# Patient Record
Sex: Male | Born: 1935
Health system: Southern US, Community
[De-identification: ages and names within clinical notes are randomized; demographics above are authoritative.]

## PROBLEM LIST (undated history)

## (undated) DIAGNOSIS — R0600 Dyspnea, unspecified: Secondary | ICD-10-CM

## (undated) DIAGNOSIS — D649 Anemia, unspecified: Secondary | ICD-10-CM

## (undated) DIAGNOSIS — E785 Hyperlipidemia, unspecified: Secondary | ICD-10-CM

## (undated) DIAGNOSIS — Q251 Coarctation of aorta: Secondary | ICD-10-CM

## (undated) DIAGNOSIS — E119 Type 2 diabetes mellitus without complications: Secondary | ICD-10-CM

## (undated) DIAGNOSIS — Z9289 Personal history of other medical treatment: Secondary | ICD-10-CM

## (undated) DIAGNOSIS — M1990 Unspecified osteoarthritis, unspecified site: Secondary | ICD-10-CM

## (undated) DIAGNOSIS — I350 Nonrheumatic aortic (valve) stenosis: Secondary | ICD-10-CM

## (undated) DIAGNOSIS — I10 Essential (primary) hypertension: Secondary | ICD-10-CM

## (undated) DIAGNOSIS — Q253 Supravalvular aortic stenosis: Secondary | ICD-10-CM

## (undated) DIAGNOSIS — K922 Gastrointestinal hemorrhage, unspecified: Secondary | ICD-10-CM

## (undated) HISTORY — DX: Hyperlipidemia, unspecified: E78.5

## (undated) HISTORY — PX: CARDIAC CATHETERIZATION: SHX172

## (undated) HISTORY — PX: HERNIA REPAIR: SHX51

---

## 2000-07-06 ENCOUNTER — Ambulatory Visit (HOSPITAL_COMMUNITY): Admission: RE | Admit: 2000-07-06 | Discharge: 2000-07-06 | Payer: Self-pay | Admitting: *Deleted

## 2000-07-06 ENCOUNTER — Encounter (INDEPENDENT_AMBULATORY_CARE_PROVIDER_SITE_OTHER): Payer: Self-pay | Admitting: *Deleted

## 2008-08-28 ENCOUNTER — Encounter: Admission: RE | Admit: 2008-08-28 | Discharge: 2008-11-26 | Payer: Self-pay | Admitting: Internal Medicine

## 2010-09-20 ENCOUNTER — Other Ambulatory Visit: Payer: Self-pay | Admitting: Surgery

## 2010-12-27 NOTE — Procedures (Signed)
[place]. [facility]  Patient:    Marvin Houston, Marvin Houston                           MRN: [id number] Proc. Date: 07/06/00 Attending:  Sabino Gasser, M.D.                           Procedure Report  PROCEDURE PERFORMED:  Upper endoscopy.  ENDOSCOPIST:  Sabino Gasser, M.D.  INDICATIONS FOR PROCEDURE:  Heme positivity.  ANESTHESIA:  Demerol 85 mg, Versed 8.5 mg were given intravenously in divided dose.  DESCRIPTION OF PROCEDURE:  With the patient mildly sedated in the left lateral decubitus position, the Olympus video endoscope was inserted in the mouth and passed under direct vision through the esophagus which appeared normal, into the stomach.  The fundus, body, antrum and duodenal bulb were all well-visualized and appeared normal.  From this point, the endoscope was slowly withdrawn taking circumferential views of the entire duodenal mucosa until the endoscope had been pulled back into the stomach and placed on retroflexion to view the stomach from below and this, too, appeared normal. The endoscope was then straightened and withdrawn taking cirfumferential views of the remaining of the remaining gastric mucosa and esophageal mucosa, all of which appeared normal.  Patients vital signs and pulse oximeter remained stable.  The patient tolerated the procedure well without apparent complications.  FINDINGS:  Essentially normal endoscopic examination.  PLAN:  Proceed to colonoscopy. DD:  07/06/00 TD:  07/06/00 Job: [id number]

## 2010-12-27 NOTE — Op Note (Signed)
[place]. [facility]  Patient:    Marvin Houston, Marvin Houston                           MRN: [id number] Proc. Date: 07/06/00 Adm. Date:  07/06/00 Attending:  Sabino Gasser, M.D.                           Operative Report  PROCEDURE:  Colonoscopy.  INDICATIONS:  Hemoccult positivity.  ANESTHESIA:  Demerol and additional 15 mg, Versed 1.5 additional mg.  PROCEDURE:  Colonoscopy.  INDICATIONS:  Hemoccult positivity.  ANESTHESIA:  Demerol 20 mg, Versed 3 mg was given additionally in a divided dose.  DESCRIPTION OF PROCEDURE:  With the patient mildly sedated in the left lateral decubitus position, the Olympus videoscopic colonoscope was inserted into the rectum and passed under direct vision with pressure applied with the patient position changed from the left lateral decubitus to his back to reach the cecum.  The cecum identified by ileocecal valve and appendiceal orifice. There was an AVM visualized in the cecum and this was photographed only.  From this point the endoscope was then slowly withdrawn taking circumferential views of the entire colonic mucosa stopping only then above the rectum, about 15 cm from the anal verge at which point a small polyp was seen, photographed and removed using snare cautery technique.  The endoscope was then pulled back to the rectum which appeared normal on direct, and in retroflexed views.  The endoscope was straightened and withdrawn.  The patients vital signs and pulse oximeter remained stable. The patient tolerated the procedure well without apparent complication.  FINDINGS:  Polyp of 15 cm from the anal verge, AVM which may have been the cause of this patients Hemoccult positivity.  Otherwise unremarkable examination.  PLAN:  Will have patient follow up with me as an outpatient and call me for results of biopsy. DD:  07/06/00 TD:  07/06/00 Job: [id number]

## 2014-08-28 DIAGNOSIS — R319 Hematuria, unspecified: Secondary | ICD-10-CM | POA: Diagnosis not present

## 2014-08-28 DIAGNOSIS — I10 Essential (primary) hypertension: Secondary | ICD-10-CM | POA: Diagnosis not present

## 2014-08-28 DIAGNOSIS — R739 Hyperglycemia, unspecified: Secondary | ICD-10-CM | POA: Diagnosis not present

## 2014-09-08 DIAGNOSIS — R319 Hematuria, unspecified: Secondary | ICD-10-CM | POA: Diagnosis not present

## 2014-09-08 DIAGNOSIS — I10 Essential (primary) hypertension: Secondary | ICD-10-CM | POA: Diagnosis not present

## 2014-09-22 DIAGNOSIS — D3192 Benign neoplasm of unspecified part of left eye: Secondary | ICD-10-CM | POA: Diagnosis not present

## 2014-09-28 DIAGNOSIS — I359 Nonrheumatic aortic valve disorder, unspecified: Secondary | ICD-10-CM | POA: Diagnosis not present

## 2014-10-20 DIAGNOSIS — E785 Hyperlipidemia, unspecified: Secondary | ICD-10-CM | POA: Diagnosis not present

## 2014-10-20 DIAGNOSIS — I10 Essential (primary) hypertension: Secondary | ICD-10-CM | POA: Diagnosis not present

## 2014-10-20 DIAGNOSIS — I2510 Atherosclerotic heart disease of native coronary artery without angina pectoris: Secondary | ICD-10-CM | POA: Diagnosis not present

## 2014-10-20 DIAGNOSIS — I350 Nonrheumatic aortic (valve) stenosis: Secondary | ICD-10-CM | POA: Diagnosis not present

## 2014-11-17 DIAGNOSIS — H18412 Arcus senilis, left eye: Secondary | ICD-10-CM | POA: Diagnosis not present

## 2014-11-17 DIAGNOSIS — H18411 Arcus senilis, right eye: Secondary | ICD-10-CM | POA: Diagnosis not present

## 2014-11-17 DIAGNOSIS — H02839 Dermatochalasis of unspecified eye, unspecified eyelid: Secondary | ICD-10-CM | POA: Diagnosis not present

## 2014-11-17 DIAGNOSIS — H2511 Age-related nuclear cataract, right eye: Secondary | ICD-10-CM | POA: Diagnosis not present

## 2014-11-17 DIAGNOSIS — H2512 Age-related nuclear cataract, left eye: Secondary | ICD-10-CM | POA: Diagnosis not present

## 2015-01-01 DIAGNOSIS — H25812 Combined forms of age-related cataract, left eye: Secondary | ICD-10-CM | POA: Diagnosis not present

## 2015-01-01 DIAGNOSIS — H2512 Age-related nuclear cataract, left eye: Secondary | ICD-10-CM | POA: Diagnosis not present

## 2015-01-02 DIAGNOSIS — H2511 Age-related nuclear cataract, right eye: Secondary | ICD-10-CM | POA: Diagnosis not present

## 2015-02-09 DIAGNOSIS — R319 Hematuria, unspecified: Secondary | ICD-10-CM | POA: Diagnosis not present

## 2015-02-09 DIAGNOSIS — I10 Essential (primary) hypertension: Secondary | ICD-10-CM | POA: Diagnosis not present

## 2015-02-16 DIAGNOSIS — Z0000 Encounter for general adult medical examination without abnormal findings: Secondary | ICD-10-CM | POA: Diagnosis not present

## 2015-02-16 DIAGNOSIS — E780 Pure hypercholesterolemia: Secondary | ICD-10-CM | POA: Diagnosis not present

## 2015-02-16 DIAGNOSIS — I10 Essential (primary) hypertension: Secondary | ICD-10-CM | POA: Diagnosis not present

## 2015-02-16 DIAGNOSIS — R739 Hyperglycemia, unspecified: Secondary | ICD-10-CM | POA: Diagnosis not present

## 2015-03-09 ENCOUNTER — Other Ambulatory Visit: Payer: Self-pay | Admitting: Physician Assistant

## 2015-03-09 DIAGNOSIS — D225 Melanocytic nevi of trunk: Secondary | ICD-10-CM | POA: Diagnosis not present

## 2015-03-09 DIAGNOSIS — L820 Inflamed seborrheic keratosis: Secondary | ICD-10-CM | POA: Diagnosis not present

## 2015-03-09 DIAGNOSIS — L821 Other seborrheic keratosis: Secondary | ICD-10-CM | POA: Diagnosis not present

## 2015-03-09 DIAGNOSIS — D485 Neoplasm of uncertain behavior of skin: Secondary | ICD-10-CM | POA: Diagnosis not present

## 2015-04-27 ENCOUNTER — Other Ambulatory Visit: Payer: Self-pay | Admitting: Physician Assistant

## 2015-04-27 DIAGNOSIS — L089 Local infection of the skin and subcutaneous tissue, unspecified: Secondary | ICD-10-CM | POA: Diagnosis not present

## 2015-04-27 DIAGNOSIS — D485 Neoplasm of uncertain behavior of skin: Secondary | ICD-10-CM | POA: Diagnosis not present

## 2015-04-27 DIAGNOSIS — L820 Inflamed seborrheic keratosis: Secondary | ICD-10-CM | POA: Diagnosis not present

## 2015-08-17 DIAGNOSIS — D225 Melanocytic nevi of trunk: Secondary | ICD-10-CM | POA: Diagnosis not present

## 2015-08-17 DIAGNOSIS — L905 Scar conditions and fibrosis of skin: Secondary | ICD-10-CM | POA: Diagnosis not present

## 2015-08-17 DIAGNOSIS — R7309 Other abnormal glucose: Secondary | ICD-10-CM | POA: Diagnosis not present

## 2015-08-17 DIAGNOSIS — I10 Essential (primary) hypertension: Secondary | ICD-10-CM | POA: Diagnosis not present

## 2015-08-17 DIAGNOSIS — L570 Actinic keratosis: Secondary | ICD-10-CM | POA: Diagnosis not present

## 2015-08-17 DIAGNOSIS — L821 Other seborrheic keratosis: Secondary | ICD-10-CM | POA: Diagnosis not present

## 2015-09-06 DIAGNOSIS — I10 Essential (primary) hypertension: Secondary | ICD-10-CM | POA: Diagnosis not present

## 2015-09-06 DIAGNOSIS — Z1389 Encounter for screening for other disorder: Secondary | ICD-10-CM | POA: Diagnosis not present

## 2015-09-06 DIAGNOSIS — E119 Type 2 diabetes mellitus without complications: Secondary | ICD-10-CM | POA: Diagnosis not present

## 2015-09-06 DIAGNOSIS — E785 Hyperlipidemia, unspecified: Secondary | ICD-10-CM | POA: Diagnosis not present

## 2015-10-11 DIAGNOSIS — R531 Weakness: Secondary | ICD-10-CM | POA: Diagnosis not present

## 2015-10-11 DIAGNOSIS — E119 Type 2 diabetes mellitus without complications: Secondary | ICD-10-CM | POA: Diagnosis not present

## 2015-10-11 DIAGNOSIS — R55 Syncope and collapse: Secondary | ICD-10-CM | POA: Diagnosis not present

## 2015-10-11 DIAGNOSIS — D509 Iron deficiency anemia, unspecified: Secondary | ICD-10-CM | POA: Diagnosis not present

## 2015-10-11 DIAGNOSIS — R42 Dizziness and giddiness: Secondary | ICD-10-CM | POA: Diagnosis not present

## 2015-10-11 DIAGNOSIS — K921 Melena: Secondary | ICD-10-CM | POA: Diagnosis not present

## 2015-10-11 DIAGNOSIS — R404 Transient alteration of awareness: Secondary | ICD-10-CM | POA: Diagnosis not present

## 2015-10-11 DIAGNOSIS — I2510 Atherosclerotic heart disease of native coronary artery without angina pectoris: Secondary | ICD-10-CM | POA: Diagnosis not present

## 2015-10-15 DIAGNOSIS — Z86010 Personal history of colonic polyps: Secondary | ICD-10-CM | POA: Diagnosis not present

## 2015-10-15 DIAGNOSIS — D649 Anemia, unspecified: Secondary | ICD-10-CM | POA: Diagnosis not present

## 2015-10-15 DIAGNOSIS — K625 Hemorrhage of anus and rectum: Secondary | ICD-10-CM | POA: Diagnosis not present

## 2015-10-15 DIAGNOSIS — Z8774 Personal history of (corrected) congenital malformations of heart and circulatory system: Secondary | ICD-10-CM | POA: Diagnosis not present

## 2015-10-19 DIAGNOSIS — E785 Hyperlipidemia, unspecified: Secondary | ICD-10-CM | POA: Diagnosis not present

## 2015-10-19 DIAGNOSIS — R0989 Other specified symptoms and signs involving the circulatory and respiratory systems: Secondary | ICD-10-CM | POA: Diagnosis not present

## 2015-10-19 DIAGNOSIS — I2510 Atherosclerotic heart disease of native coronary artery without angina pectoris: Secondary | ICD-10-CM | POA: Diagnosis not present

## 2015-10-19 DIAGNOSIS — I350 Nonrheumatic aortic (valve) stenosis: Secondary | ICD-10-CM | POA: Diagnosis not present

## 2015-11-15 DIAGNOSIS — K644 Residual hemorrhoidal skin tags: Secondary | ICD-10-CM | POA: Diagnosis not present

## 2015-11-15 DIAGNOSIS — K5730 Diverticulosis of large intestine without perforation or abscess without bleeding: Secondary | ICD-10-CM | POA: Diagnosis not present

## 2015-11-15 DIAGNOSIS — K648 Other hemorrhoids: Secondary | ICD-10-CM | POA: Diagnosis not present

## 2015-11-15 DIAGNOSIS — D500 Iron deficiency anemia secondary to blood loss (chronic): Secondary | ICD-10-CM | POA: Diagnosis not present

## 2015-11-15 DIAGNOSIS — K921 Melena: Secondary | ICD-10-CM | POA: Diagnosis not present

## 2015-11-28 DIAGNOSIS — I350 Nonrheumatic aortic (valve) stenosis: Secondary | ICD-10-CM | POA: Diagnosis not present

## 2015-11-28 DIAGNOSIS — R0989 Other specified symptoms and signs involving the circulatory and respiratory systems: Secondary | ICD-10-CM | POA: Diagnosis not present

## 2015-12-11 ENCOUNTER — Encounter (HOSPITAL_COMMUNITY): Payer: Self-pay | Admitting: Emergency Medicine

## 2015-12-11 DIAGNOSIS — Z79899 Other long term (current) drug therapy: Secondary | ICD-10-CM | POA: Diagnosis not present

## 2015-12-11 DIAGNOSIS — Z7984 Long term (current) use of oral hypoglycemic drugs: Secondary | ICD-10-CM | POA: Diagnosis not present

## 2015-12-11 DIAGNOSIS — Z7982 Long term (current) use of aspirin: Secondary | ICD-10-CM | POA: Diagnosis not present

## 2015-12-11 DIAGNOSIS — R339 Retention of urine, unspecified: Secondary | ICD-10-CM | POA: Diagnosis not present

## 2015-12-11 DIAGNOSIS — K5900 Constipation, unspecified: Secondary | ICD-10-CM | POA: Diagnosis not present

## 2015-12-11 LAB — CBC
HCT: 37.9 % — ABNORMAL LOW (ref 39.0–52.0)
HEMOGLOBIN: 12.4 g/dL — AB (ref 13.0–17.0)
MCH: 29.8 pg (ref 26.0–34.0)
MCHC: 32.7 g/dL (ref 30.0–36.0)
MCV: 91.1 fL (ref 78.0–100.0)
Platelets: 246 10*3/uL (ref 150–400)
RBC: 4.16 MIL/uL — ABNORMAL LOW (ref 4.22–5.81)
RDW: 14.2 % (ref 11.5–15.5)
WBC: 17.6 10*3/uL — ABNORMAL HIGH (ref 4.0–10.5)

## 2015-12-11 NOTE — ED Notes (Signed)
Pt. reports constipation for 3 days with abdominal pressure and bladder discomfort / urinary retention , denies nausea or fever .

## 2015-12-12 ENCOUNTER — Emergency Department (HOSPITAL_COMMUNITY)
Admission: EM | Admit: 2015-12-12 | Discharge: 2015-12-12 | Disposition: A | Discharge status: Home / Self Care | Payer: Medicare Other | Attending: Emergency Medicine | Admitting: Emergency Medicine

## 2015-12-12 DIAGNOSIS — R0989 Other specified symptoms and signs involving the circulatory and respiratory systems: Secondary | ICD-10-CM | POA: Diagnosis not present

## 2015-12-12 DIAGNOSIS — I2510 Atherosclerotic heart disease of native coronary artery without angina pectoris: Secondary | ICD-10-CM | POA: Diagnosis not present

## 2015-12-12 DIAGNOSIS — R339 Retention of urine, unspecified: Secondary | ICD-10-CM

## 2015-12-12 DIAGNOSIS — I10 Essential (primary) hypertension: Secondary | ICD-10-CM | POA: Diagnosis not present

## 2015-12-12 DIAGNOSIS — I350 Nonrheumatic aortic (valve) stenosis: Secondary | ICD-10-CM | POA: Diagnosis not present

## 2015-12-12 LAB — COMPREHENSIVE METABOLIC PANEL
ALBUMIN: 4.5 g/dL (ref 3.5–5.0)
ALK PHOS: 67 U/L (ref 38–126)
ALT: 21 U/L (ref 17–63)
AST: 21 U/L (ref 15–41)
Anion gap: 13 (ref 5–15)
BILIRUBIN TOTAL: 2 mg/dL — AB (ref 0.3–1.2)
BUN: 13 mg/dL (ref 6–20)
CO2: 20 mmol/L — ABNORMAL LOW (ref 22–32)
Calcium: 9.6 mg/dL (ref 8.9–10.3)
Chloride: 104 mmol/L (ref 101–111)
Creatinine, Ser: 1.14 mg/dL (ref 0.61–1.24)
GFR calc Af Amer: >60 mL/min (ref >60)
GFR calc non Af Amer: 59 mL/min — ABNORMAL LOW (ref >60)
GLUCOSE: 215 mg/dL — AB (ref 65–99)
POTASSIUM: 4 mmol/L (ref 3.5–5.1)
Sodium: 137 mmol/L (ref 135–145)
TOTAL PROTEIN: 7.5 g/dL (ref 6.5–8.1)

## 2015-12-12 LAB — URINE MICROSCOPIC-ADD ON

## 2015-12-12 LAB — URINALYSIS, ROUTINE W REFLEX MICROSCOPIC
Bilirubin Urine: NEGATIVE
GLUCOSE, UA: 250 mg/dL — AB
Ketones, ur: 15 mg/dL — AB
Leukocytes, UA: NEGATIVE
Nitrite: NEGATIVE
PH: 5.5 (ref 5.0–8.0)
PROTEIN: 30 mg/dL — AB
Specific Gravity, Urine: 1.013 (ref 1.005–1.030)

## 2015-12-12 LAB — LIPASE, BLOOD: Lipase: 33 U/L (ref 11–51)

## 2015-12-12 NOTE — Discharge Instructions (Signed)
You were seen today for urinary retention. This is treated with a Foley catheter. He will go home with a Foley catheter. You need follow-up with urology. Make sure to take stool softeners while taking iron to continue to have bowel movements. If you have any new or worsening symptoms she should be reevaluated immediately. If you develop fever, back pain, vomiting, or any new or worsening symptoms she needs to be reevaluated.  Acute Urinary Retention, Male Acute urinary retention is the temporary inability to urinate. This is a common problem in older men. As men age their prostates become larger and block the flow of urine from the bladder. This is usually a problem that has come on gradually.  HOME CARE INSTRUCTIONS If you are sent home with a Foley catheter and a drainage system, you will need to discuss the best course of action with your health care provider. While the catheter is in, maintain a good intake of fluids. Keep the drainage bag emptied and lower than your catheter. This is so that contaminated urine will not flow back into your bladder, which could lead to a urinary tract infection. There are two main types of drainage bags. One is a large bag that usually is used at night. It has a good capacity that will allow you to sleep through the night without having to empty it. The second type is called a leg bag. It has a smaller capacity, so it needs to be emptied more frequently. However, the main advantage is that it can be attached by a leg strap and can go underneath your clothing, allowing you the freedom to move about or leave your home. Only take over-the-counter or prescription medicines for pain, discomfort, or fever as directed by your health care provider.  SEEK MEDICAL CARE IF:  You develop a low-grade fever.  You experience spasms or leakage of urine with the spasms. SEEK IMMEDIATE MEDICAL CARE IF:   You develop chills or fever.  Your catheter stops draining urine.  Your  catheter falls out.  You start to develop increased bleeding that does not respond to rest and increased fluid intake. MAKE SURE YOU:  Understand these instructions.  Will watch your condition.  Will get help right away if you are not doing well or get worse.   This information is not intended to replace advice given to you by your health care provider. Make sure you discuss any questions you have with your health care provider.   Document Released: 11/03/2000 Document Revised: 12/12/2014 Document Reviewed: 01/06/2013 Elsevier Interactive Patient Education [employer].

## 2015-12-12 NOTE — ED Provider Notes (Signed)
CSN: [id number]     Arrival date & time 12/11/15  2257 History   First MD Initiated Contact with Patient 12/12/15 [phone]     Chief Complaint  Patient presents with  . Constipation     (Consider location/radiation/quality/duration/timing/severity/associated sxs/prior Treatment) HPI  This is a 82 year old male who presents with constipation and urinary retention. Patient reports 3 day history of constipation. States that he was started on iron tablets for anemia in March and since that time he has had issues with constipation. He had not had a normal bowel movement in 2-3 days. He took Dulcolax yesterday and since waiting in the emergency room has had multiple soft stools. However, he states that he has not had any significant urine output since yesterday morning at 8:30. He denies any history of urinary retention. He denies fevers or back pain. He currently is reporting lower abdominal pressure. No nausea or vomiting.  History reviewed. No pertinent past medical history. History reviewed. No pertinent past surgical history. No family history on file. Social History  Substance Use Topics  . Smoking status: None  . Smokeless tobacco: None  . Alcohol Use: None    Review of Systems  Constitutional: Negative for fever.  Respiratory: Negative for shortness of breath.   Cardiovascular: Negative for chest pain.  Gastrointestinal: Positive for abdominal pain and constipation. Negative for vomiting and diarrhea.  Genitourinary: Positive for difficulty urinating. Negative for dysuria and flank pain.  All other systems reviewed and are negative.     Allergies  Review of patient's allergies indicates no known allergies.  Home Medications   Prior to Admission medications   Medication Sig Start Date End Date Taking? Authorizing Provider  aspirin EC 81 MG tablet Take 81 mg by mouth daily.   Yes Historical Provider, MD  Cyanocobalamin (VITAMIN B-12 PO) Take 1 tablet by mouth daily.   Yes  Historical Provider, MD  FeFum-FePoly-FA-B Cmp-C-Biot (FOLIVANE-PLUS PO) Take 1 tablet by mouth daily.    Yes Historical Provider, MD  lisinopril (PRINIVIL,ZESTRIL) 40 MG tablet Take 40 mg by mouth daily.   Yes Historical Provider, MD  lovastatin (MEVACOR) 40 MG tablet Take 40 mg by mouth at bedtime.   Yes Historical Provider, MD  metFORMIN (GLUCOPHAGE) 500 MG tablet Take 500 mg by mouth every evening.   Yes Historical Provider, MD  Multiple Vitamin (MULTIVITAMIN WITH MINERALS) TABS tablet Take 1 tablet by mouth daily.   Yes Historical Provider, MD  Multiple Vitamins-Minerals (VISION VITAMINS PO) Take 1 tablet by mouth daily.   Yes Historical Provider, MD  sildenafil (VIAGRA) 100 MG tablet Take 100 mg by mouth daily as needed for erectile dysfunction.   Yes Historical Provider, MD   BP 140/85 mmHg  Pulse 85  Temp(Src) 98.2 F (36.8 C) (Oral)  Resp 18  Ht 5\' 10"  (1.778 m)  Wt 216 lb 4 oz (98.09 kg)  BMI 31.03 kg/m2  SpO2 98% Physical Exam  Constitutional: He is oriented to person, place, and time. He appears well-developed and well-nourished.  Elderly, no acute distress  HENT:  Head: Normocephalic and atraumatic.  Cardiovascular: Normal rate, regular rhythm and normal heart sounds.   No murmur heard. Pulmonary/Chest: Effort normal and breath sounds normal. No respiratory distress. He has no wheezes.  Abdominal: Soft. Bowel sounds are normal. There is tenderness. There is no rebound and no guarding.  Lower abdominal tenderness to palpation without rebound or guarding  Genitourinary:  No CVA tenderness  Musculoskeletal: He exhibits no edema.  Neurological: He is  alert and oriented to person, place, and time.  Skin: Skin is warm and dry.  Psychiatric: He has a normal mood and affect.  Nursing note and vitals reviewed.   ED Course  Procedures (including critical care time) Labs Review Labs Reviewed  COMPREHENSIVE METABOLIC PANEL - Abnormal; Notable for the following:    CO2 20  (*)    Glucose, Bld 215 (*)    Total Bilirubin 2.0 (*)    GFR calc non Af Amer 59 (*)    All other components within normal limits  CBC - Abnormal; Notable for the following:    WBC 17.6 (*)    RBC 4.16 (*)    Hemoglobin 12.4 (*)    HCT 37.9 (*)    All other components within normal limits  URINALYSIS, ROUTINE W REFLEX MICROSCOPIC (NOT AT [facility]) - Abnormal; Notable for the following:    Glucose, UA 250 (*)    Hgb urine dipstick MODERATE (*)    Ketones, ur 15 (*)    Protein, ur 30 (*)    All other components within normal limits  URINE MICROSCOPIC-ADD ON - Abnormal; Notable for the following:    Squamous Epithelial / LPF 6-30 (*)    Bacteria, UA RARE (*)    All other components within normal limits  LIPASE, BLOOD    Imaging Review No results found. I have personally reviewed and evaluated these images and lab results as part of my medical decision-making.   EKG Interpretation None      MDM   Final diagnoses:  Urinary retention    Patient presents with recent history of constipation and urinary retention 1 day. He has some lower abdominal tenderness. Since being in the ER he has had multiple bowel movements after taking a laxative. His tenderness is in the suprapubic region. Bladder scan shows greater than 1 L of fluid in the bladder. Foley was placed with total urine output of greater than 1900 mL. No evidence of your urinary tract infection. Patient reports improvement of abdominal pain with Foley placement. Kidney function is at baseline. He does have evidence of leukocytosis. This is of unclear significance. No CVA tenderness or fever. Repeat abdominal exam is benign. Discussed with patient follow-up with urology. Continue Foley catheter until follow-up. He was given strict return precautions. He was also encouraged to use a stool softener while taking iron. Hemoglobin today is 12.4.  After history, exam, and medical workup I feel the patient has been appropriately  medically screened and is safe for discharge home. Pertinent diagnoses were discussed with the patient. Patient was given return precautions.     Merryl Hacker, MD 12/12/15 [phone]

## 2015-12-17 ENCOUNTER — Encounter (HOSPITAL_COMMUNITY): Payer: Self-pay | Admitting: Emergency Medicine

## 2015-12-17 ENCOUNTER — Emergency Department (HOSPITAL_COMMUNITY)
Admission: EM | Admit: 2015-12-17 | Discharge: 2015-12-17 | Disposition: A | Discharge status: Home / Self Care | Payer: Medicare Other | Attending: Emergency Medicine | Admitting: Emergency Medicine

## 2015-12-17 DIAGNOSIS — R319 Hematuria, unspecified: Secondary | ICD-10-CM | POA: Insufficient documentation

## 2015-12-17 DIAGNOSIS — R339 Retention of urine, unspecified: Secondary | ICD-10-CM | POA: Diagnosis not present

## 2015-12-17 DIAGNOSIS — Z7984 Long term (current) use of oral hypoglycemic drugs: Secondary | ICD-10-CM | POA: Diagnosis not present

## 2015-12-17 DIAGNOSIS — I10 Essential (primary) hypertension: Secondary | ICD-10-CM | POA: Insufficient documentation

## 2015-12-17 DIAGNOSIS — R1030 Lower abdominal pain, unspecified: Secondary | ICD-10-CM | POA: Insufficient documentation

## 2015-12-17 DIAGNOSIS — R39198 Other difficulties with micturition: Secondary | ICD-10-CM | POA: Insufficient documentation

## 2015-12-17 DIAGNOSIS — N401 Enlarged prostate with lower urinary tract symptoms: Secondary | ICD-10-CM | POA: Diagnosis not present

## 2015-12-17 DIAGNOSIS — Z79899 Other long term (current) drug therapy: Secondary | ICD-10-CM | POA: Diagnosis not present

## 2015-12-17 DIAGNOSIS — Z7982 Long term (current) use of aspirin: Secondary | ICD-10-CM | POA: Insufficient documentation

## 2015-12-17 DIAGNOSIS — R351 Nocturia: Secondary | ICD-10-CM | POA: Diagnosis not present

## 2015-12-17 HISTORY — DX: Essential (primary) hypertension: I10

## 2015-12-17 LAB — URINALYSIS, ROUTINE W REFLEX MICROSCOPIC
BILIRUBIN URINE: NEGATIVE
Glucose, UA: NEGATIVE mg/dL
KETONES UR: NEGATIVE mg/dL
Leukocytes, UA: NEGATIVE
NITRITE: NEGATIVE
PROTEIN: 30 mg/dL — AB
Specific Gravity, Urine: 1.006 (ref 1.005–1.030)
pH: 6 (ref 5.0–8.0)

## 2015-12-17 LAB — URINE MICROSCOPIC-ADD ON: SQUAMOUS EPITHELIAL / LPF: NONE SEEN

## 2015-12-17 NOTE — Discharge Instructions (Signed)
Acute Urinary Retention, Male °Acute urinary retention is the temporary inability to urinate. °This is a common problem in older men. As men age their prostates become larger and block the flow of urine from the bladder. This is usually a problem that has come on gradually.  °HOME CARE INSTRUCTIONS °If you are sent home with a Foley catheter and a drainage system, you will need to discuss the best course of action with your health care provider. While the catheter is in, maintain a good intake of fluids. Keep the drainage bag emptied and lower than your catheter. This is so that contaminated urine will not flow back into your bladder, which could lead to a urinary tract infection. °There are two main types of drainage bags. One is a large bag that usually is used at night. It has a good capacity that will allow you to sleep through the night without having to empty it. The second type is called a leg bag. It has a smaller capacity, so it needs to be emptied more frequently. However, the main advantage is that it can be attached by a leg strap and can go underneath your clothing, allowing you the freedom to move about or leave your home. °Only take over-the-counter or prescription medicines for pain, discomfort, or fever as directed by your health care provider.  °SEEK MEDICAL CARE IF: °· You develop a low-grade fever. °· You experience spasms or leakage of urine with the spasms. °SEEK IMMEDIATE MEDICAL CARE IF:  °· You develop chills or fever. °· Your catheter stops draining urine. °· Your catheter falls out. °· You start to develop increased bleeding that does not respond to rest and increased fluid intake. °MAKE SURE YOU: °· Understand these instructions. °· Will watch your condition. °· Will get help right away if you are not doing well or get worse. °  °This information is not intended to replace advice given to you by your health care provider. Make sure you discuss any questions you have with your health care  provider. °  °Document Released: 11/03/2000 Document Revised: 12/12/2014 Document Reviewed: 01/06/2013 °Elsevier Interactive Patient Education ©2016 Elsevier Inc. ° °

## 2015-12-17 NOTE — ED Provider Notes (Signed)
CSN: [id number]     Arrival date & time 12/17/15  1842 History   First MD Initiated Contact with Patient 12/17/15 1933     Chief Complaint  Patient presents with  . Urinary Retention     (Consider location/radiation/quality/duration/timing/severity/associated sxs/prior Treatment) HPI Comments: Patient with history of urinary retention. Seen at urology office today and foley catheter was removed. As the day progressed, patient has been unable to void, and suprapubic pressure and pain have developed.  Bladder scan reveals 999 ml of fluid.  Patient is a 82 y.o. male presenting with abdominal pain.  Abdominal Pain Pain location:  Suprapubic Pain quality: fullness   Pain radiates to:  Does not radiate Pain severity:  Moderate Onset quality:  Sudden Duration:  6 hours Timing:  Constant Progression:  Worsening Chronicity:  Recurrent   Past Medical History  Diagnosis Date  . Hypertension    History reviewed. No pertinent past surgical history. History reviewed. No pertinent family history. Social History  Substance Use Topics  . Smoking status: Never Smoker   . Smokeless tobacco: None  . Alcohol Use: Yes    Review of Systems  Gastrointestinal: Positive for abdominal pain.  Genitourinary: Positive for difficulty urinating.  All other systems reviewed and are negative.     Allergies  Review of patient's allergies indicates no known allergies.  Home Medications   Prior to Admission medications   Medication Sig Start Date End Date Taking? Authorizing Provider  aspirin EC 81 MG tablet Take 81 mg by mouth daily.    Historical Provider, MD  Cyanocobalamin (VITAMIN B-12 PO) Take 1 tablet by mouth daily.    Historical Provider, MD  FeFum-FePoly-FA-B Cmp-C-Biot (FOLIVANE-PLUS PO) Take 1 tablet by mouth daily.     Historical Provider, MD  lisinopril (PRINIVIL,ZESTRIL) 40 MG tablet Take 40 mg by mouth daily.    Historical Provider, MD  lovastatin (MEVACOR) 40 MG tablet Take 40 mg  by mouth at bedtime.    Historical Provider, MD  metFORMIN (GLUCOPHAGE) 500 MG tablet Take 500 mg by mouth every evening.    Historical Provider, MD  Multiple Vitamin (MULTIVITAMIN WITH MINERALS) TABS tablet Take 1 tablet by mouth daily.    Historical Provider, MD  Multiple Vitamins-Minerals (VISION VITAMINS PO) Take 1 tablet by mouth daily.    Historical Provider, MD  sildenafil (VIAGRA) 100 MG tablet Take 100 mg by mouth daily as needed for erectile dysfunction.    Historical Provider, MD   BP 204/99 mmHg  Pulse 89  Temp(Src) 98.5 F (36.9 C) (Oral)  Resp 18  SpO2 98% Physical Exam  Constitutional: He is oriented to person, place, and time. He appears well-developed and well-nourished.  HENT:  Head: Normocephalic.  Eyes: Conjunctivae are normal.  Neck: Neck supple.  Cardiovascular: Normal rate and regular rhythm.   Pulmonary/Chest: Effort normal and breath sounds normal.  Abdominal: Soft. There is tenderness.    Musculoskeletal: He exhibits no edema or tenderness.  Lymphadenopathy:    He has no cervical adenopathy.  Neurological: He is alert and oriented to person, place, and time.  Skin: Skin is warm and dry.  Psychiatric: He has a normal mood and affect.  Nursing note and vitals reviewed.   ED Course  Procedures (including critical care time) Labs Review Labs Reviewed  URINALYSIS, ROUTINE W REFLEX MICROSCOPIC (NOT AT [facility]) - Abnormal; Notable for the following:    Hgb urine dipstick LARGE (*)    Protein, ur 30 (*)    All other components within  normal limits  URINE MICROSCOPIC-ADD ON - Abnormal; Notable for the following:    Bacteria, UA RARE (*)    All other components within normal limits    Imaging Review No results found. I have personally reviewed and evaluated these images and lab results as part of my medical decision-making.   EKG Interpretation None     Patient discussed with and seen by Dr. Gilford Raid. Patient feels better and blood pressure improved  after insertion of foley and decompression of bladder. MDM   Final diagnoses:  None  Urinary retention. Hematuria, but no current indication of infection.  Care instructions provided. Return precautions discussed. Patient is scheduled for urology follow-up on Friday.     Etta Quill, NP 12/17/15 2220

## 2015-12-17 NOTE — ED Notes (Signed)
Pt sts had foley removed today at urology office; pt sts unable to void

## 2015-12-17 NOTE — ED Notes (Signed)
Scanned Pt bladder Pt bladder has 999 mL of fluid

## 2015-12-24 DIAGNOSIS — N401 Enlarged prostate with lower urinary tract symptoms: Secondary | ICD-10-CM | POA: Diagnosis not present

## 2016-01-16 DIAGNOSIS — E119 Type 2 diabetes mellitus without complications: Secondary | ICD-10-CM | POA: Diagnosis not present

## 2016-01-16 DIAGNOSIS — N390 Urinary tract infection, site not specified: Secondary | ICD-10-CM | POA: Diagnosis not present

## 2016-01-16 DIAGNOSIS — E785 Hyperlipidemia, unspecified: Secondary | ICD-10-CM | POA: Diagnosis not present

## 2016-01-16 DIAGNOSIS — I10 Essential (primary) hypertension: Secondary | ICD-10-CM | POA: Diagnosis not present

## 2016-01-18 ENCOUNTER — Other Ambulatory Visit: Payer: Self-pay | Admitting: Internal Medicine

## 2016-01-18 ENCOUNTER — Ambulatory Visit (HOSPITAL_COMMUNITY)
Admission: RE | Admit: 2016-01-18 | Discharge: 2016-01-18 | Disposition: A | Discharge status: Home / Self Care | Payer: Medicare Other | Source: Ambulatory Visit | Attending: Internal Medicine | Admitting: Internal Medicine

## 2016-01-18 ENCOUNTER — Other Ambulatory Visit (HOSPITAL_COMMUNITY): Payer: Self-pay | Admitting: *Deleted

## 2016-01-18 DIAGNOSIS — D500 Iron deficiency anemia secondary to blood loss (chronic): Secondary | ICD-10-CM

## 2016-01-18 DIAGNOSIS — D649 Anemia, unspecified: Secondary | ICD-10-CM | POA: Diagnosis not present

## 2016-01-18 LAB — ABO/RH: ABO/RH(D): O NEG

## 2016-01-18 LAB — PREPARE RBC (CROSSMATCH)

## 2016-01-18 MED ORDER — DIPHENHYDRAMINE HCL 25 MG PO CAPS
ORAL_CAPSULE | ORAL | Status: AC
  Filled 2016-01-18: qty 1

## 2016-01-18 MED ORDER — ACETAMINOPHEN 325 MG PO TABS
650.0000 mg | ORAL_TABLET | Freq: Once | ORAL | Status: AC
  Administered 2016-01-18: 650 mg via ORAL

## 2016-01-18 MED ORDER — SODIUM CHLORIDE 0.9 % IV SOLN
Freq: Once | INTRAVENOUS | Status: AC
Start: 2016-01-18 — End: 2016-01-18
  Administered 2016-01-18: 10:00:00 via INTRAVENOUS

## 2016-01-18 MED ORDER — ACETAMINOPHEN 325 MG PO TABS
ORAL_TABLET | ORAL | Status: AC
  Filled 2016-01-18: qty 2

## 2016-01-18 MED ORDER — DIPHENHYDRAMINE HCL 25 MG PO CAPS
25.0000 mg | ORAL_CAPSULE | Freq: Once | ORAL | Status: AC
  Administered 2016-01-18: 25 mg via ORAL

## 2016-01-19 LAB — TYPE AND SCREEN
ABO/RH(D): O NEG
Antibody Screen: NEGATIVE
UNIT DIVISION: 0
UNIT DIVISION: 0

## 2016-01-30 DIAGNOSIS — D509 Iron deficiency anemia, unspecified: Secondary | ICD-10-CM | POA: Diagnosis not present

## 2016-02-22 DIAGNOSIS — L905 Scar conditions and fibrosis of skin: Secondary | ICD-10-CM | POA: Diagnosis not present

## 2016-02-22 DIAGNOSIS — L820 Inflamed seborrheic keratosis: Secondary | ICD-10-CM | POA: Diagnosis not present

## 2016-02-22 DIAGNOSIS — L814 Other melanin hyperpigmentation: Secondary | ICD-10-CM | POA: Diagnosis not present

## 2016-02-22 DIAGNOSIS — D235 Other benign neoplasm of skin of trunk: Secondary | ICD-10-CM | POA: Diagnosis not present

## 2016-02-22 DIAGNOSIS — D1801 Hemangioma of skin and subcutaneous tissue: Secondary | ICD-10-CM | POA: Diagnosis not present

## 2016-02-27 DIAGNOSIS — H5203 Hypermetropia, bilateral: Secondary | ICD-10-CM | POA: Diagnosis not present

## 2016-02-27 DIAGNOSIS — D3132 Benign neoplasm of left choroid: Secondary | ICD-10-CM | POA: Diagnosis not present

## 2016-02-29 DIAGNOSIS — D509 Iron deficiency anemia, unspecified: Secondary | ICD-10-CM | POA: Diagnosis not present

## 2016-03-07 DIAGNOSIS — D509 Iron deficiency anemia, unspecified: Secondary | ICD-10-CM | POA: Diagnosis not present

## 2016-03-07 DIAGNOSIS — R739 Hyperglycemia, unspecified: Secondary | ICD-10-CM | POA: Diagnosis not present

## 2016-03-07 DIAGNOSIS — I10 Essential (primary) hypertension: Secondary | ICD-10-CM | POA: Diagnosis not present

## 2016-04-18 DIAGNOSIS — H26492 Other secondary cataract, left eye: Secondary | ICD-10-CM | POA: Diagnosis not present

## 2016-04-18 DIAGNOSIS — H2511 Age-related nuclear cataract, right eye: Secondary | ICD-10-CM | POA: Diagnosis not present

## 2016-04-18 DIAGNOSIS — Z961 Presence of intraocular lens: Secondary | ICD-10-CM | POA: Diagnosis not present

## 2016-04-18 DIAGNOSIS — I10 Essential (primary) hypertension: Secondary | ICD-10-CM | POA: Diagnosis not present

## 2016-04-25 DIAGNOSIS — H2513 Age-related nuclear cataract, bilateral: Secondary | ICD-10-CM | POA: Diagnosis not present

## 2016-05-02 DIAGNOSIS — R739 Hyperglycemia, unspecified: Secondary | ICD-10-CM | POA: Diagnosis not present

## 2016-05-02 DIAGNOSIS — I10 Essential (primary) hypertension: Secondary | ICD-10-CM | POA: Diagnosis not present

## 2016-05-09 DIAGNOSIS — I10 Essential (primary) hypertension: Secondary | ICD-10-CM | POA: Diagnosis not present

## 2016-05-09 DIAGNOSIS — R739 Hyperglycemia, unspecified: Secondary | ICD-10-CM | POA: Diagnosis not present

## 2016-05-09 DIAGNOSIS — Z125 Encounter for screening for malignant neoplasm of prostate: Secondary | ICD-10-CM | POA: Diagnosis not present

## 2016-05-09 DIAGNOSIS — Z23 Encounter for immunization: Secondary | ICD-10-CM | POA: Diagnosis not present

## 2016-05-09 DIAGNOSIS — E7800 Pure hypercholesterolemia, unspecified: Secondary | ICD-10-CM | POA: Diagnosis not present

## 2016-06-11 DIAGNOSIS — I350 Nonrheumatic aortic (valve) stenosis: Secondary | ICD-10-CM | POA: Diagnosis not present

## 2016-06-20 DIAGNOSIS — R0989 Other specified symptoms and signs involving the circulatory and respiratory systems: Secondary | ICD-10-CM | POA: Diagnosis not present

## 2016-06-20 DIAGNOSIS — I2510 Atherosclerotic heart disease of native coronary artery without angina pectoris: Secondary | ICD-10-CM | POA: Diagnosis not present

## 2016-06-20 DIAGNOSIS — I350 Nonrheumatic aortic (valve) stenosis: Secondary | ICD-10-CM | POA: Diagnosis not present

## 2016-06-20 DIAGNOSIS — I10 Essential (primary) hypertension: Secondary | ICD-10-CM | POA: Diagnosis not present

## 2016-07-28 NOTE — H&P (Signed)
OFFICE VISIT NOTES COPIED TO EPIC FOR DOCUMENTATION  . History of Present Illness Marvin Page MD; 06/21/2016 12:28 PM) Patient words: 6 month FU for aortic stenosis and echo results; Last OV 12/12/2015.  The patient is a 82 year old male who presents for a follow-up for Aortic stenosis. Marvin Houston is a very active and pleasant Caucasian male with moderate aortic stenosis and mild CAD who recently presented for his annual visit. He denies syncope, exertional syncope, dyspnea at rest, orthopnea or chest pain. Overall, doing well and is on appropriate medical therapy.  In January 2017, he had normal physical exam and labs were within normal limits. He had developed severe anemia and upper GI evaluation in April 2017 by Dr. Jamey Ripa, felt to be due to hemorrhoids both internal and external along with large AV malformations however they were not bleeding in the ascending colon and cecum. He was recommended capsule endoscopy if anemia persisted as patient has history of anemia in the past  No recurrence of GI bleed. Presently feels more fatigued than usual and states that his golf game is not as good as it used to be in the past. Occasional left leg cramps. No TIA or claudication. No further constipatiion since being on daily Metamucil.   Problem List/Past Medical Anderson Malta Sergeant; 2016/07/15 11:51 AM) Essential hypertension, benign (I10)  Aortic stenosis, severe (I35.0)  Echocardiogram 06/11/2016: Left ventricle cavity is normal in size. Moderate concentric hypertrophy of the left ventricle. Normal global wall motion. Doppler evidence of grade II (pseudonormal) diastolic dysfunction, elevated LAP. Calculated EF 58%. Left atrial cavity is mildly dilated at 4.2 cm. Mild calcification of the aortic valve annulus. Moderate aortic valve leaflet calcification. Severely restricted aortic valve leaflets. Severe aortic valve stenosis. Aortic valve peak pressure gradient of 68 and mean gradient of 41  mmHg, calculated aortic valve area 0.80 cm. No aortic valve regurgitation noted. Mild (Grade I) mitral regurgitation. Mild tricuspid regurgitation. No evidence of pulmonary hypertension. IVC is dilated with respiratory variation. Suggests elevated central venous pressure. Compared to the study done on 11/28/2015, 812 gradients has progressed from peak gradient of 49 and mean gradient of 31 mmHg. Hyperlipidemia, mild (E78.5)  10/11/2015: HB 9.4/HCT 28.4 with normocytic indices Labs 12/30/2013: Total cholesterol 105, triglycerides 76, HDL 28, LDL 62. CBC, CMP was normal. HbA1c 5.5%. Lipid profile 12/03/2012: Total cholesterol 122, triglycerides 99, HDL 37, LDL 65. Bilateral carotid bruits (R09.89)  Carotid artery duplex 11/28/2015: Minimal plaque of the carotid arteries bilateral without stenosis. Antegrade right vertebral artery flow. Antegrade left vertebral artery flow. No significant change since study dated 09/19/2013. Screening for other and unspecified cardiovascular conditions (V81.2)  Colonic Polyps follows Dr. Paulita Fujita.  Colonoscopy 11/15/2015: Internal hemorrhoids, scattered diverticula, a few large angiodysplastic lesions without bleeding in the ascending colon and cecum. Anemia likely secondary to chronic disease, blood in stool likely from hemorrhoids. CAD in native artery (I25.10) 2007 Heart Catheterization in 2007. No significant CAD. Mild AS, 60% RCA stenosis, Controlled type 2 diabetes mellitus without complication, without long-term current use of insulin (E11.9)   Allergies Anderson Malta Sergeant; 07-15-2016 11:46 AM) No Known Drug Allergies 07/19/2012  Family History Anderson Malta Sergeant; 2016-07-15 11:46 AM) Mother  Deceased. at age 90, from natural causes Father  Deceased. at age 74, from blood disease Siblings  brother, CABG at age 15 2 brothers, deceased from Cancer 3 sisters, deceased from Natural Causes  Social History Lolita Lenz; 2016-07-15 11:46 AM) Current tobacco  use  Former smoker. quit in 1983 Alcohol  Use  Moderate alcohol use. drinks 2 glasses of red wine per night Marital status  Married. Number of Children  2. Living Situation  Lives with spouse.  Past Surgical History Lolita Lenz; 06/20/2016 11:46 AM) Heart Catheterization in 2007. No significant CAD.   Medication History Anderson Malta Sergeant; 06/20/2016 11:53 AM) Lisinopril (40MG  Tablet, 1 Oral daily) Active. Aspirin (81MG  Tablet, 1 Oral daily) Active. One-A-Day Mens Health Formula (1 Oral daily) Active. Eye Health Formula (180-15-5MG  Capsule, 1 Oral daily) Active. Lovastatin (40MG  Tablet, 1 Oral twice weekly) Active. Vitamin B-12 2.5mg  (1 daily) Active. MetFORMIN HCl (500MG  Tablet, 1 Oral daily) Active. Folivane-Plus (1 Oral daily) Active. Medications Reconciled (verbally with pt/ no list or medications present) Iron (18MG  Tablet ER, 1 Oral two times daily) Active.  Diagnostic Studies History Anderson Malta Sergeant; 06/20/2016 11:46 AM) Echocardiogram 06/11/2016 Left ventricle cavity is normal in size. Moderate concentric hypertrophy of the left ventricle. Normal global wall motion. Doppler evidence of grade II (pseudonormal) diastolic dysfunction, elevated LAP. Calculated EF 58%. Left atrial cavity is mildly dilated at 4.2 cm. Mild calcification of the aortic valve annulus. Moderate aortic valve leaflet calcification. Severely restricted aortic valve leaflets. Severe aortic valve stenosis. Aortic valve peak pressure gradient of 68 and mean gradient of 41 mmHg, calculated aortic valve area 0.80 cm. No aortic valve regurgitation noted. Mild (Grade I) mitral regurgitation. Mild tricuspid regurgitation. No evidence of pulmonary hypertension. IVC is dilated with respiratory variation. Suggests elevated central venous pressure. Compared to the study done on 11/28/2015, 812 gradients has progressed from peak gradient of 49 and mean gradient of 31 mmHg. echo 01/23/09: Normal LV syst  function. Mild AS. LBBB wall motion.  carotid doppler 05/30/10: Mild fibrocalcific plaque right ICA, othersise no significant disease  Labwork  10/11/2015: HB 9.4/HCT 28.4 with normocytic indices 12/30/2013: Total cholesterol 105, triglycerides 76, HDL 28, LDL 62. CBC, CMP was normal. HbA1c 5.5%. 12/03/2012: Total cholesterol 122, triglycerides 99, HDL 37, LDL 65. Coronary Angiogram 2007 Heart Catheterization in 2007. No significant CAD. Mild AS, 60% RCA stenosis, Carotid Doppler 09/19/2013 No evidence of hemodynamically significant stenosis in the bilateral carotid bifurcation vessels. No significant change since 05/30/2010. Mild soft plaque bilateral carotids noted Echocardiogram 11/28/2015 1. Left ventricle cavity is normal in size. Moderate concentric hypertrophy of the left ventricle. Normal global wall motion. Doppler evidence of grade I (impaired) diastolic dysfunction. Calculated EF 61%. 2. No aortic valve regurgitation noted. Moderate calcification of the aortic valve annulus. Moderate aortic valve leaflet calcification. Moderate to severely restricted aortic valve leaflets. Moderate to severe aortic valve stenosis. Peak gradient- 49, mean gradient 31.4 mm of Hg. Calculated valve area is 0.77cm2. 3. Mild to moderate mitral regurgitation. Mild calcification of the mitral valve annulus. 4. Trace tricuspid regurgitation. PA pressure can't be calculated due to insufficient TR signal. 5. Mildly dilated ascending aorta, measures 3.7 cm. 6. IVC is dilated with respiratory variation. 7. c.f. echo. of 09/28/2014, severity of aortic stenosis has increased Carotid Doppler 11/28/2015 Minimal plaque of the carotid arteries bilateral without stenosis. Antegrade right vertebral artery flow. Antegrade left vertebral artery flow. No significant change since study dated 09/19/2013.  Other Problems Anderson Malta Sergeant; 06/20/2016 11:51 AM) Carotid artery stenosis without cerebral infarction (I65.29)  Carotid  doppler 05/30/10: Mild fibrocalcific plaque right ICA, othersise no significant disease. No stenosis.    Review of Systems Marvin Page MD; 06/21/2016 12:28 PM) General Not Present- Fatigue (New onset since GI bleed in February 2017), Tiredness and Unable to Sleep Lying Flat. HEENT Not Present-  Blurred Vision. Respiratory Present- Difficulty Breathing on Exertion. Not Present- Bloody sputum and Wakes up from Sleep Wheezing or Short of Breath. Cardiovascular Present- Leg Cramps (Occasinal leg cramps at night). Not Present- Edema, Palpitations and Paroxysmal Nocturnal Dyspnea. Gastrointestinal Not Present- Bloody Stool and Heartburn. Male Genitourinary Not Present- Urinary Retention. Musculoskeletal Present- Back Pain (chronic and sees a chiropractor occasionally). Not Present- Claudication and Joint Pain. Neurological Not Present- Focal Neurological Symptoms. Psychiatric Not Present- Personality Changes and Suicidal Ideation. Hematology Not Present- Blood Clots, Easy Bruising and Nose Bleed.  Vitals Marvin Page MD; 06/20/2016 12:29 PM) 06/20/2016 12:29 PM Weight: 206.31 lb Height: 70in Body Surface Area: 2.12 m Body Mass Index: 29.6 kg/m  BP: 150/80 (Sitting, Left Arm, Standard)    06/20/2016 11:50 AM Weight: 206.31 lb Height: 70in Body Surface Area: 2.12 m Body Mass Index: 29.6 kg/m  Pulse: 54 (Regular)  P.OX: 98% (Room air) BP: 172/74 (Sitting, Left Arm, Standard)       Physical Exam Marvin Page, MD; 06/21/2016 12:32 PM) General Mental Status-Alert. General Appearance-Cooperative, Appears stated age, Not in acute distress. Build & Nutrition-Muscular and Well built.  Head and Neck Thyroid Gland Characteristics - no palpable nodules, no palpable enlargement.  Chest and Lung Exam Chest and lung exam reveals -on auscultation, normal breath sounds, no adventitious sounds.  Cardiovascular Inspection Jugular vein - Right -  No Distention. Auscultation Heart Sounds - S1 WNL, S2 WNL and No gallop present. Murmurs & Other Heart Sounds: Murmur - Location - Apex. Timing - Early systolic. Grade - III/VI. Character - Crescendo. Radiation - Carotids.  Abdomen Palpation/Percussion Normal exam - Non Tender and No hepatosplenomegaly. Auscultation Normal exam - Bowel sounds normal.  Peripheral Vascular Lower Extremity Inspection - Bilateral - Inspection Normal. Palpation - Edema - Bilateral - No edema. Femoral pulse - Bilateral - Normal. Popliteal pulse - Left - 1+. Right - Normal. Dorsalis pedis pulse - Left - 1+. Right - Normal. Bilateral - Normal. Posterior tibial pulse - Left - 1+. Right - Normal. Bilateral - Normal. Carotid arteries - Left-Harsh Bruit. Carotid arteries - Right-Soft Bruit. Abdomen-No prominent abdominal aortic pulsation, No epigastric bruit.  Neurologic Neurologic evaluation reveals -alert and oriented x 3 with no impairment of recent or remote memory. Motor-Grossly intact without any focal deficits.  Musculoskeletal Global Assessment Left Lower Extremity - normal range of motion without pain. Right Lower Extremity - normal range of motion without pain.   Results Marvin Page MD; 06/21/2016 12:32 PM) Procedures  Name Value Date Echocardiography, transthoracic, real-time with image documentation (2D), includes M-mode recording, when performed, complete, with spectral Doppler echocardiography, and with color flow Doppler echocardiography [id number]) : CAD Comments: Echocardiogram 06/11/2016: Left ventricle cavity is normal in size. Moderate concentric hypertrophy of the left ventricle. Normal global wall motion. Doppler evidence of grade II (pseudonormal) diastolic dysfunction, elevated LAP. Calculated EF 58%. Left atrial cavity is mildly dilated at 4.2 cm. Mild calcification of the aortic valve annulus. Moderate aortic valve leaflet calcification. Severely restricted aortic valve  leaflets. Severe aortic valve stenosis. Aortic valve peak pressure gradient of 68 and mean gradient of 41 mmHg, calculated aortic valve area 0.80 cm. No aortic valve regurgitation noted. Mild (Grade I) mitral regurgitation. Mild tricuspid regurgitation. No evidence of pulmonary hypertension. IVC is dilated with respiratory variation. Suggests elevated central venous pressure. Compared to the study done on 11/28/2015, 812 gradients has progressed from peak gradient of 49 and mean gradient of 31 mmHg.  Performed: 06/13/2016 6:52 AM  Assessment & Plan Marvin Page MD; 06/21/2016 12:31 PM) CAD in native artery (I25.10) Story: Heart Catheterization in 2007. No significant CAD. Mild AS, 60% RCA stenosis, Impression: EKG 06/20/2016: Normal sinus rhythm/sinus bradycardia at rate of 46 bpm, normal axis. QRS widening, borderline criteria for LVH. No significant change from 07/01/15 (bradycardia)  EKG 10/19/2015: Normal sinus rhythm at rate of 61 bpm, normal axis, LVH. Frequent PACs atrial triplets). Normal QT interval. Current Plans Complete electrocardiogram (93000) Aortic stenosis, severe (I35.0) Story: Echocardiogram 06/11/2016: Left ventricle cavity is normal in size. Moderate concentric hypertrophy of the left ventricle. Normal global wall motion. Doppler evidence of grade II (pseudonormal) diastolic dysfunction, elevated LAP. Calculated EF 58%. Left atrial cavity is mildly dilated at 4.2 cm. Mild calcification of the aortic valve annulus. Moderate aortic valve leaflet calcification. Severely restricted aortic valve leaflets. Severe aortic valve stenosis. Aortic valve peak pressure gradient of 68 and mean gradient of 41 mmHg, calculated aortic valve area 0.80 cm. No aortic valve regurgitation noted. Mild (Grade I) mitral regurgitation. Mild tricuspid regurgitation. No evidence of pulmonary hypertension. IVC is dilated with respiratory variation. Suggests elevated central venous  pressure. Compared to the study done on 11/28/2015, his gradients has progressed from peak gradient of 49 and mean gradient of 31 mmHg. Bilateral carotid bruits (R09.89) Story: Carotid artery duplex 11/28/2015: Minimal plaque of the carotid arteries bilateral without stenosis. Antegrade right vertebral artery flow. Antegrade left vertebral artery flow. No significant change since study dated 09/19/2013. Labwork Story: 10/11/2015: HB 9.4/HCT 28.4 with normocytic indices  12/30/2013: Total cholesterol 105, triglycerides 76, HDL 28, LDL 62. CBC, CMP was normal. HbA1c 5.5%.  12/03/2012: Total cholesterol 122, triglycerides 99, HDL 37, LDL 65. Essential hypertension, benign (I10) Current Plans Mechanism of underlying disease process and action of medications discussed with the patient. I discussed primary/secondary prevention and also dietary counceling was done. Patient had severe GI bleed in February 2017 and ha colonoscopy by Dr. Arta Silence revealed no AVM and no obvious bleeding source.  With regard to the severe AS, repeat echo reveals increasing PG across the aortic valve. I would like to schedule him for TEE to better define the anatomy and also to look for aortic atheroma which would help the surgeon during aortic valve replacement. Schedule him for TEE. He will need right and left heart catheterization, patient would prefer to wait until the holidays, unless TEE reveals critical AS I suspect it should be fine to wait until then. He is advised to contact me for her to develop any worsening dyspnea or dizziness. He has symptomatic aortic stenosis as he has noticed mild decrease in his exercise capacity, mild dyspnea and fatigue, however anemia may also be contributing. Schedule for TEE to better evaluate the structural abnormality. I have explained risks, benefits,alternatives to TEE, including but not limited to rare esophageal perforation, bleeding, aspiration pneumonia.  CC Dr. Jani Gravel.  Signed by Marvin Page, MD (06/21/2016 12:32 PM)

## 2016-07-29 ENCOUNTER — Ambulatory Visit (HOSPITAL_COMMUNITY)
Admission: RE | Admit: 2016-07-29 | Discharge: 2016-07-29 | Disposition: A | Discharge status: Home / Self Care | Payer: Medicare Other | Source: Ambulatory Visit | Attending: Cardiology | Admitting: Cardiology

## 2016-07-29 ENCOUNTER — Encounter (HOSPITAL_COMMUNITY): Payer: Self-pay | Admitting: *Deleted

## 2016-07-29 ENCOUNTER — Encounter (HOSPITAL_COMMUNITY)
Admission: RE | Disposition: A | Discharge status: Home / Self Care | Payer: Self-pay | Source: Ambulatory Visit | Attending: Cardiology

## 2016-07-29 DIAGNOSIS — Z79899 Other long term (current) drug therapy: Secondary | ICD-10-CM | POA: Insufficient documentation

## 2016-07-29 DIAGNOSIS — E785 Hyperlipidemia, unspecified: Secondary | ICD-10-CM | POA: Diagnosis not present

## 2016-07-29 DIAGNOSIS — Z7982 Long term (current) use of aspirin: Secondary | ICD-10-CM | POA: Insufficient documentation

## 2016-07-29 DIAGNOSIS — I10 Essential (primary) hypertension: Secondary | ICD-10-CM | POA: Diagnosis not present

## 2016-07-29 DIAGNOSIS — E119 Type 2 diabetes mellitus without complications: Secondary | ICD-10-CM | POA: Diagnosis not present

## 2016-07-29 DIAGNOSIS — Z87891 Personal history of nicotine dependence: Secondary | ICD-10-CM | POA: Diagnosis not present

## 2016-07-29 DIAGNOSIS — I083 Combined rheumatic disorders of mitral, aortic and tricuspid valves: Secondary | ICD-10-CM | POA: Insufficient documentation

## 2016-07-29 DIAGNOSIS — Z7984 Long term (current) use of oral hypoglycemic drugs: Secondary | ICD-10-CM | POA: Diagnosis not present

## 2016-07-29 DIAGNOSIS — I2510 Atherosclerotic heart disease of native coronary artery without angina pectoris: Secondary | ICD-10-CM | POA: Diagnosis not present

## 2016-07-29 DIAGNOSIS — I350 Nonrheumatic aortic (valve) stenosis: Secondary | ICD-10-CM | POA: Diagnosis not present

## 2016-07-29 HISTORY — PX: TEE WITHOUT CARDIOVERSION: SHX5443

## 2016-07-29 LAB — ECHO TEE
AV Area VTI index: 0.58 cm2/m2
AV Area VTI: 1.4 cm2
AV Area mean vel: 1.24 cm2
AV VEL mean LVOT/AV: 0.25
AV peak Index: 0.66
AV pk vel: 421 cm/s
AV vel: 1.23
AVA: 1.23 cm2
AVAREAMEANVIN: 0.59 cm2/m2
AVG: 49 mmHg
AVLVOTPG: 6 mmHg
AVPG: 71 mmHg
Ao pk vel: 0.29 m/s
CHL CUP AV VALUE AREA INDEX: 0.58
CHL CUP DOP CALC LVOT VTI: 28.7 cm
DOP CAL AO MEAN VELOCITY: 334 cm/s
LVOT area: 4.91 cm2
LVOT diameter: 25 mm
LVOT peak VTI: 0.25 cm
LVOT peak vel: 120 cm/s
LVOTSV: 141 mL
VTI: 115 cm

## 2016-07-29 LAB — GLUCOSE, CAPILLARY: GLUCOSE-CAPILLARY: 135 mg/dL — AB (ref 65–99)

## 2016-07-29 SURGERY — ECHOCARDIOGRAM, TRANSESOPHAGEAL
Anesthesia: Moderate Sedation

## 2016-07-29 MED ORDER — SODIUM CHLORIDE 0.9 % IV SOLN
INTRAVENOUS | Status: DC
  Administered 2016-07-29: 500 mL via INTRAVENOUS

## 2016-07-29 MED ORDER — DIPHENHYDRAMINE HCL 50 MG/ML IJ SOLN
INTRAMUSCULAR | Status: AC
  Filled 2016-07-29: qty 1

## 2016-07-29 MED ORDER — BUTAMBEN-TETRACAINE-BENZOCAINE 2-2-14 % EX AERO
INHALATION_SPRAY | CUTANEOUS | Status: DC | PRN
  Administered 2016-07-29: 2 via TOPICAL

## 2016-07-29 MED ORDER — FENTANYL CITRATE (PF) 100 MCG/2ML IJ SOLN
INTRAMUSCULAR | Status: AC
  Filled 2016-07-29: qty 2

## 2016-07-29 MED ORDER — MIDAZOLAM HCL 5 MG/ML IJ SOLN
INTRAMUSCULAR | Status: AC
  Filled 2016-07-29: qty 2

## 2016-07-29 MED ORDER — FENTANYL CITRATE (PF) 100 MCG/2ML IJ SOLN
INTRAMUSCULAR | Status: DC | PRN
  Administered 2016-07-29: 50 ug via INTRAVENOUS

## 2016-07-29 MED ORDER — MIDAZOLAM HCL 10 MG/2ML IJ SOLN
INTRAMUSCULAR | Status: DC | PRN
Start: 2016-07-29 — End: 2016-07-29
  Administered 2016-07-29: 2 mg via INTRAVENOUS

## 2016-07-29 NOTE — Progress Notes (Signed)
  Echocardiogram Echocardiogram Transesophageal has been performed.  Annabel Gibeau L Androw 07/29/2016, 2:12 PM

## 2016-07-29 NOTE — Discharge Instructions (Signed)
Transesophageal Echocardiogram  Transesophageal echocardiography (TEE) is a picture test of your heart using sound waves. The pictures taken can give very detailed pictures of your heart. This can help your doctor see if there are problems with your heart. TEE can check:  · If your heart has blood clots in it.  · How well your heart valves are working.  · If you have an infection on the inside of your heart.  · Some of the major arteries of your heart.  · If your heart valve is working after a repair.  · Your heart before a procedure that uses a shock to your heart to get the rhythm back to normal.    What happens before the procedure?  · Do not eat or drink for 6 hours before the procedure or as told by your doctor.  · Make plans to have someone drive you home after the procedure. Do not drive yourself home.  · An IV tube will be put in your arm.  What happens during the procedure?  · You will be given a medicine to help you relax (sedative). It will be given through the IV tube.  · A numbing medicine will be sprayed or gargled in the back of your throat to help numb it.  · The tip of the probe is placed into the back of your mouth. You will be asked to swallow. This helps to pass the probe into your esophagus.  · Once the tip of the probe is in the right place, your doctor can take pictures of your heart.  · You may feel pressure at the back of your throat.  What happens after the procedure?  · You will be taken to a recovery area so the sedative can wear off.  · Your throat may be sore and scratchy. This will go away slowly over time.  · You will go home when you are fully awake and able to swallow liquids.  · You should have someone stay with you for the next 24 hours.  · Do not drive or operate machinery for the next 24 hours.  This information is not intended to replace advice given to you by your health care provider. Make sure you discuss any questions you have with your health care provider.  Document  Released: 05/25/2009 Document Revised: 01/03/2016 Document Reviewed: 01/27/2013  Elsevier Interactive Patient Education © 2017 [employer]

## 2016-07-29 NOTE — Interval H&P Note (Signed)
History and Physical Interval Note:  07/29/2016 12:19 PM  Marvin Houston  has presented today for surgery, with the diagnosis of aortic stenosis  The various methods of treatment have been discussed with the patient and family. After consideration of risks, benefits and other options for treatment, the patient has consented to  Procedure(s): TRANSESOPHAGEAL ECHOCARDIOGRAM (TEE) (N/A) as a surgical intervention .  The patient's history has been reviewed, patient examined, no change in status, stable for surgery.  I have reviewed the patient's chart and labs.  Questions were answered to the patient's satisfaction.     Adrian Prows

## 2016-07-29 NOTE — CV Procedure (Signed)
TEE: Under moderate sedation, TEE was performed without complications: LV: Mild LVH. Normal EF. RV: Normal LA: Normal. Left atrial appendage: Normal without thrombus. Normal function. Inter atrial septum is intact without defect. Double contrast study negative for atrial level shunting. No late appearance of bubbles either. RA: Normal  MV: Normal Mild  MR. TV: Normal Trace TR AV: Calcified and severely restricted mtion. Mean PG 49, peak PG 71 mm Hg. alculated AVA 1.23 cm2 PV: Normal. Trace PI.  Thoracic and ascending aorta: Calcific plaque throughout the thoracic aorta. Moderate plaque burden arch.   Conscious sedation protocol was followed, I personally administered conscious sedation and monitored the patient. Patient received 2 milligrams of Versed and 50 fentanyl . Patient tolerated the procedure well and there was no complication from conscious sedation. Time administered was 29 minutes.

## 2016-07-30 ENCOUNTER — Encounter (HOSPITAL_COMMUNITY): Payer: Self-pay | Admitting: Cardiology

## 2016-08-05 DIAGNOSIS — I2510 Atherosclerotic heart disease of native coronary artery without angina pectoris: Secondary | ICD-10-CM | POA: Diagnosis not present

## 2016-08-07 DIAGNOSIS — Z23 Encounter for immunization: Secondary | ICD-10-CM | POA: Diagnosis not present

## 2016-08-09 DIAGNOSIS — I350 Nonrheumatic aortic (valve) stenosis: Secondary | ICD-10-CM

## 2016-08-09 NOTE — H&P (Addendum)
OFFICE VISIT NOTES COPIED TO EPIC FOR DOCUMENTATION  . History of Present Illness Marvin Page MD; 06/21/2016 12:28 PM) The patient is a 82 year old male who presents for a follow-up for Aortic stenosis. Mr. Marvin Houston is a very active and pleasant Caucasian male with moderate aortic stenosis and mild CAD who recently presented for his annual visit. He denies syncope, exertional syncope, dyspnea at rest, orthopnea or chest pain. Overall, doing well and is on appropriate medical therapy.  In January 2017, he had normal physical exam and labs were within normal limits. He had developed severe anemia and upper GI evaluation in April 2017 by Dr. Jamey Ripa, felt to be due to hemorrhoids both internal and external along with large AV malformations however they were not bleeding in the ascending colon and cecum. He was recommended capsule endoscopy if anemia persisted as patient has history of anemia in the past  No recurrence of GI bleed. Presently feels more fatigued than usual and states that his golf game is not as good as it used to be in the past. Occasional left leg cramps. No TIA or claudication. No further constipatiion since being on daily Metamucil.  On 07/29/2016 he underwent TEE which revealed severe aortic stenosis  With a mean gradient of 47 mmHg. He is now scheduled for left and right heart catheterization, no change in symptoms patient understands risks and wanted to proceed with catheterization.   Problem List/Past Medical Anderson Malta Sergeant; 07/01/2016 11:51 AM) Essential hypertension, benign (I10)  Aortic stenosis, severe (I35.0)  Echocardiogram 06/11/2016: Left ventricle cavity is normal in size. Moderate concentric hypertrophy of the left ventricle. Normal global wall motion. Doppler evidence of grade II (pseudonormal) diastolic dysfunction, elevated LAP. Calculated EF 58%. Left atrial cavity is mildly dilated at 4.2 cm. Mild calcification of the aortic valve annulus. Moderate  aortic valve leaflet calcification. Severely restricted aortic valve leaflets. Severe aortic valve stenosis. Aortic valve peak pressure gradient of 68 and mean gradient of 41 mmHg, calculated aortic valve area 0.80 cm. No aortic valve regurgitation noted. Mild (Grade I) mitral regurgitation. Mild tricuspid regurgitation. No evidence of pulmonary hypertension. IVC is dilated with respiratory variation. Suggests elevated central venous pressure. Compared to the study done on 11/28/2015, 812 gradients has progressed from peak gradient of 49 and mean gradient of 31 mmHg. Hyperlipidemia, mild (E78.5)  10/11/2015: HB 9.4/HCT 28.4 with normocytic indices Labs 12/30/2013: Total cholesterol 105, triglycerides 76, HDL 28, LDL 62. CBC, CMP was normal. HbA1c 5.5%. Lipid profile 12/03/2012: Total cholesterol 122, triglycerides 99, HDL 37, LDL 65. Bilateral carotid bruits (R09.89)  Carotid artery duplex 11/28/2015: Minimal plaque of the carotid arteries bilateral without stenosis. Antegrade right vertebral artery flow. Antegrade left vertebral artery flow. No significant change since study dated 09/19/2013. Screening for other and unspecified cardiovascular conditions (V81.2)  Colonic Polyps follows Dr. Paulita Fujita.  Colonoscopy 11/15/2015: Internal hemorrhoids, scattered diverticula, a few large angiodysplastic lesions without bleeding in the ascending colon and cecum. Anemia likely secondary to chronic disease, blood in stool likely from hemorrhoids. CAD in native artery (I25.10) 2007 Heart Catheterization in 2007. No significant CAD. Mild AS, 60% RCA stenosis, Controlled type 2 diabetes mellitus without complication, without long-term current use of insulin (E11.9)   Allergies Anderson Malta Sergeant; 07-01-16 11:46 AM) No Known Drug Allergies 07/19/2012  Family History Anderson Malta Sergeant; 07/01/2016 11:46 AM) Mother  Deceased. at age 34, from natural causes Father  Deceased. at age 67, from blood disease Siblings   brother, CABG at age 28 2 brothers,  deceased from Cancer 3 sisters, deceased from Natural Causes  Social History Lolita Lenz; 06/20/2016 11:46 AM) Current tobacco use  Former smoker. quit in 1983 Alcohol Use  Moderate alcohol use. drinks 2 glasses of red wine per night Marital status  Married. Number of Children  2. Living Situation  Lives with spouse.  Past Surgical History Lolita Lenz; 06/20/2016 11:46 AM) Heart Catheterization in 2007. No significant CAD.   Medication History Anderson Malta Sergeant; 06/20/2016 11:53 AM) Lisinopril (40MG  Tablet, 1 Oral daily) Active. Aspirin (81MG  Tablet, 1 Oral daily) Active. One-A-Day Mens Health Formula (1 Oral daily) Active. Eye Health Formula (180-15-5MG  Capsule, 1 Oral daily) Active. Lovastatin (40MG  Tablet, 1 Oral twice weekly) Active. Vitamin B-12 2.5mg  (1 daily) Active. MetFORMIN HCl (500MG  Tablet, 1 Oral daily) Active. Folivane-Plus (1 Oral daily) Active. Medications Reconciled (verbally with pt/ no list or medications present) Iron (18MG  Tablet ER, 1 Oral two times daily) Active.  Diagnostic Studies History Anderson Malta Sergeant; 06/20/2016 11:46 AM) Echocardiogram 06/11/2016 Left ventricle cavity is normal in size. Moderate concentric hypertrophy of the left ventricle. Normal global wall motion. Doppler evidence of grade II (pseudonormal) diastolic dysfunction, elevated LAP. Calculated EF 58%. Left atrial cavity is mildly dilated at 4.2 cm. Mild calcification of the aortic valve annulus. Moderate aortic valve leaflet calcification. Severely restricted aortic valve leaflets. Severe aortic valve stenosis. Aortic valve peak pressure gradient of 68 and mean gradient of 41 mmHg, calculated aortic valve area 0.80 cm. No aortic valve regurgitation noted. Mild (Grade I) mitral regurgitation. Mild tricuspid regurgitation. No evidence of pulmonary hypertension. IVC is dilated with respiratory variation. Suggests elevated central  venous pressure. Compared to the study done on 11/28/2015, 812 gradients has progressed from peak gradient of 49 and mean gradient of 31 mmHg. echo 01/23/09: Normal LV syst function. Mild AS. LBBB wall motion.  carotid doppler 05/30/10: Mild fibrocalcific plaque right ICA, othersise no significant disease  Labwork  10/11/2015: HB 9.4/HCT 28.4 with normocytic indices 12/30/2013: Total cholesterol 105, triglycerides 76, HDL 28, LDL 62. CBC, CMP was normal. HbA1c 5.5%. 12/03/2012: Total cholesterol 122, triglycerides 99, HDL 37, LDL 65. Coronary Angiogram 2007 Heart Catheterization in 2007. No significant CAD. Mild AS, 60% RCA stenosis, Carotid Doppler 09/19/2013 No evidence of hemodynamically significant stenosis in the bilateral carotid bifurcation vessels. No significant change since 05/30/2010. Mild soft plaque bilateral carotids noted Echocardiogram 11/28/2015 1. Left ventricle cavity is normal in size. Moderate concentric hypertrophy of the left ventricle. Normal global wall motion. Doppler evidence of grade I (impaired) diastolic dysfunction. Calculated EF 61%. 2. No aortic valve regurgitation noted. Moderate calcification of the aortic valve annulus. Moderate aortic valve leaflet calcification. Moderate to severely restricted aortic valve leaflets. Moderate to severe aortic valve stenosis. Peak gradient- 49, mean gradient 31.4 mm of Hg. Calculated valve area is 0.77cm2. 3. Mild to moderate mitral regurgitation. Mild calcification of the mitral valve annulus. 4. Trace tricuspid regurgitation. PA pressure can't be calculated due to insufficient TR signal. 5. Mildly dilated ascending aorta, measures 3.7 cm. 6. IVC is dilated with respiratory variation. 7. c.f. echo. of 09/28/2014, severity of aortic stenosis has increased Carotid Doppler 11/28/2015 Minimal plaque of the carotid arteries bilateral without stenosis. Antegrade right vertebral artery flow. Antegrade left vertebral artery flow. No significant  change since study dated 09/19/2013.  Other Problems Anderson Malta Sergeant; 06/20/2016 11:51 AM) Carotid artery stenosis without cerebral infarction (I65.29)  Carotid doppler 05/30/10: Mild fibrocalcific plaque right ICA, othersise no significant disease. No stenosis.    Review of Systems ([place].  Jilliana Burkes MD; 06/21/2016 12:28 PM) General Not Present- Fatigue (New onset since GI bleed in February 2017), Tiredness and Unable to Sleep Lying Flat. HEENT Not Present- Blurred Vision. Respiratory Present- Difficulty Breathing on Exertion. Not Present- Bloody sputum and Wakes up from Sleep Wheezing or Short of Breath. Cardiovascular Present- Leg Cramps (Occasinal leg cramps at night). Not Present- Edema, Palpitations and Paroxysmal Nocturnal Dyspnea. Gastrointestinal Not Present- Bloody Stool and Heartburn. Male Genitourinary Not Present- Urinary Retention. Musculoskeletal Present- Back Pain (chronic and sees a chiropractor occasionally). Not Present- Claudication and Joint Pain. Neurological Not Present- Focal Neurological Symptoms. Psychiatric Not Present- Personality Changes and Suicidal Ideation. Hematology Not Present- Blood Clots, Easy Bruising and Nose Bleed.  Vitals Marvin Page MD; 06/20/2016 12:29 PM) 06/20/2016 12:29 PM Weight: 206.31 lb Height: 70in Body Surface Area: 2.12 m Body Mass Index: 29.6 kg/m  BP: 150/80 (Sitting, Left Arm, Standard)    06/20/2016 11:50 AM Weight: 206.31 lb Height: 70in Body Surface Area: 2.12 m Body Mass Index: 29.6 kg/m  Pulse: 54 (Regular)  P.OX: 98% (Room air) BP: 172/74 (Sitting, Left Arm, Standard)       Physical Exam Marvin Page, MD; 06/21/2016 12:32 PM) General Mental Status-Alert. General Appearance-Cooperative, Appears stated age, Not in acute distress. Build & Nutrition-Muscular and Well built.  Head and Neck Thyroid Gland Characteristics - no palpable nodules, no palpable  enlargement.  Chest and Lung Exam Chest and lung exam reveals -on auscultation, normal breath sounds, no adventitious sounds.  Cardiovascular Inspection Jugular vein - Right - No Distention. Auscultation Heart Sounds - S1 WNL, S2 WNL and No gallop present. Murmurs & Other Heart Sounds: Murmur - Location - Apex. Timing - Early systolic. Grade - III/VI. Character - Crescendo. Radiation - Carotids.  Abdomen Palpation/Percussion Normal exam - Non Tender and No hepatosplenomegaly. Auscultation Normal exam - Bowel sounds normal.  Peripheral Vascular Lower Extremity Inspection - Bilateral - Inspection Normal. Palpation - Edema - Bilateral - No edema. Femoral pulse - Bilateral - Normal. Popliteal pulse - Left - 1+. Right - Normal. Dorsalis pedis pulse - Left - 1+. Right - Normal. Bilateral - Normal. Posterior tibial pulse - Left - 1+. Right - Normal. Bilateral - Normal. Carotid arteries - Left-Harsh Bruit. Carotid arteries - Right-Soft Bruit. Abdomen-No prominent abdominal aortic pulsation, No epigastric bruit.  Neurologic Neurologic evaluation reveals -alert and oriented x 3 with no impairment of recent or remote memory. Motor-Grossly intact without any focal deficits.  Musculoskeletal Global Assessment Left Lower Extremity - normal range of motion without pain. Right Lower Extremity - normal range of motion without pain.   Results Marvin Page MD; 06/21/2016 12:32 PM)  Echocardiography, transthoracic, real-time with image documentation (2D), includes M-mode recording, when performed, complete, with spectral Doppler echocardiography, and with color flow Doppler echocardiography [id number]) : CAD Comments: Echocardiogram 06/11/2016: Left ventricle cavity is normal in size. Moderate concentric hypertrophy of the left ventricle. Normal global wall motion. Doppler evidence of grade II (pseudonormal) diastolic dysfunction, elevated LAP. Calculated EF 58%. Left atrial cavity is  mildly dilated at 4.2 cm. Mild calcification of the aortic valve annulus. Moderate aortic valve leaflet calcification. Severely restricted aortic valve leaflets. Severe aortic valve stenosis. Aortic valve peak pressure gradient of 68 and mean gradient of 41 mmHg, calculated aortic valve area 0.80 cm. No aortic valve regurgitation noted. Mild (Grade I) mitral regurgitation. Mild tricuspid regurgitation. No evidence of pulmonary hypertension. IVC is dilated with respiratory variation. Suggests elevated central venous pressure. Compared to the study done on  11/28/2015, 812 gradients has progressed from peak gradient of 49 and mean gradient of 31 mmHg.  Performed: 06/13/2016 6:52 AM    Assessment & Plan Marvin Page MD; 06/21/2016 12:31 PM) CAD in native artery (I25.10) Story: Heart Catheterization in 2007. No significant CAD. Mild AS, 60% RCA stenosis, Impression: EKG 06/20/2016: Normal sinus rhythm/sinus bradycardia at rate of 46 bpm, normal axis. QRS widening, borderline criteria for LVH. No significant change from 07/01/15 (bradycardia)  EKG 10/19/2015: Normal sinus rhythm at rate of 61 bpm, normal axis, LVH. Frequent PACs atrial triplets). Normal QT interval. Current Plans Complete electrocardiogram (93000) Aortic stenosis, severe (I35.0)  TEE 07/29/16: LV: Mild LVH. Normal EF. RV: Normal LA: Normal. Left atrial appendage: Normal without thrombus. Normal function. Inter atrial septum is intact without defect. Double contrast study negative for atrial level shunting. No late appearance of bubbles either. RA: Normal  MV: Normal Mild  MR. TV: Normal Trace TR AV: Calcified and severely restricted mtion. Mean PG 49, peak PG 71 mm Hg. alculated AVA 1.23 cm2 PV: Normal. Trace PI.  Thoracic and ascending aorta: Calcific plaque throughout the thoracic aorta. Moderate plaque burden arch.  Bilateral carotid bruits (R09.89) Story: Carotid artery duplex 11/28/2015: Minimal plaque of the  carotid arteries bilateral without stenosis. Antegrade right vertebral artery flow. Antegrade left vertebral artery flow. No significant change since study dated 09/19/2013. Labwork Story: 10/11/2015: HB 9.4/HCT 28.4 with normocytic indices  12/30/2013: Total cholesterol 105, triglycerides 76, HDL 28, LDL 62. CBC, CMP was normal. HbA1c 5.5%.  12/03/2012: Total cholesterol 122, triglycerides 99, HDL 37, LDL 65. Essential hypertension, benign (I10) Current Plans Mechanism of underlying disease process and action of medications discussed with the patient. I discussed primary/secondary prevention and also dietary counceling was done. Patient had severe GI bleed in February 2017 and ha colonoscopy by Dr. Arta Silence revealed no AVM and no obvious bleeding source.  Due to severe aortic stenosis, which is symptomatic, although aortic valve area by continuity is only mild stenosis, mean gradient suggest severe stenosis.  Hence due to symptomatic aortic stenosis with continued dyspnea, we'll schedule him for left and right heart catheterization.  Patient understands risks, associated with catheterization including but not limited to less than 1% risk of death, stroke, MI, need for urgent surgical revascularization.  Patient wishes to proceed.  No change in his HPI since last OV and TEE. Patient understands the procedure and all questions answered.   Adrian Prows, MD 08/09/2016, 8:01 AM [facility] [place] Pager: [phone] Office: [phone] If no answer: Cell:  [phone]

## 2016-08-12 ENCOUNTER — Ambulatory Visit (HOSPITAL_COMMUNITY)
Admission: RE | Admit: 2016-08-12 | Discharge: 2016-08-12 | Disposition: A | Discharge status: Home / Self Care | Payer: Medicare Other | Source: Ambulatory Visit | Attending: Cardiology | Admitting: Cardiology

## 2016-08-12 ENCOUNTER — Encounter (HOSPITAL_COMMUNITY)
Admission: RE | Disposition: A | Discharge status: Home / Self Care | Payer: Self-pay | Source: Ambulatory Visit | Attending: Cardiology

## 2016-08-12 DIAGNOSIS — I358 Other nonrheumatic aortic valve disorders: Secondary | ICD-10-CM | POA: Insufficient documentation

## 2016-08-12 DIAGNOSIS — Z79899 Other long term (current) drug therapy: Secondary | ICD-10-CM | POA: Insufficient documentation

## 2016-08-12 DIAGNOSIS — Z87891 Personal history of nicotine dependence: Secondary | ICD-10-CM | POA: Insufficient documentation

## 2016-08-12 DIAGNOSIS — E782 Mixed hyperlipidemia: Secondary | ICD-10-CM | POA: Insufficient documentation

## 2016-08-12 DIAGNOSIS — Z7984 Long term (current) use of oral hypoglycemic drugs: Secondary | ICD-10-CM | POA: Diagnosis not present

## 2016-08-12 DIAGNOSIS — I2584 Coronary atherosclerosis due to calcified coronary lesion: Secondary | ICD-10-CM | POA: Insufficient documentation

## 2016-08-12 DIAGNOSIS — Z7982 Long term (current) use of aspirin: Secondary | ICD-10-CM | POA: Insufficient documentation

## 2016-08-12 DIAGNOSIS — I350 Nonrheumatic aortic (valve) stenosis: Secondary | ICD-10-CM

## 2016-08-12 DIAGNOSIS — E119 Type 2 diabetes mellitus without complications: Secondary | ICD-10-CM | POA: Diagnosis not present

## 2016-08-12 DIAGNOSIS — I10 Essential (primary) hypertension: Secondary | ICD-10-CM | POA: Diagnosis not present

## 2016-08-12 DIAGNOSIS — I2510 Atherosclerotic heart disease of native coronary artery without angina pectoris: Secondary | ICD-10-CM | POA: Diagnosis not present

## 2016-08-12 LAB — POCT I-STAT 3, ART BLOOD GAS (G3+)
Acid-base deficit: 2 mmol/L (ref 0.0–2.0)
Bicarbonate: 22.6 mmol/L (ref 20.0–28.0)
O2 SAT: 95 %
PCO2 ART: 39.2 mmHg (ref 32.0–48.0)
PH ART: 7.369 (ref 7.350–7.450)
TCO2: 24 mmol/L (ref 0–100)
pO2, Arterial: 80 mmHg — ABNORMAL LOW (ref 83.0–108.0)

## 2016-08-12 LAB — GLUCOSE, CAPILLARY
Glucose-Capillary: 169 mg/dL — ABNORMAL HIGH (ref 65–99)
Glucose-Capillary: 126 mg/dL — ABNORMAL HIGH (ref 65–99)

## 2016-08-12 SURGERY — RIGHT HEART CATH AND CORONARY ANGIOGRAPHY

## 2016-08-12 MED ORDER — SODIUM CHLORIDE 0.9 % WEIGHT BASED INFUSION
3.0000 mL/kg/h | INTRAVENOUS | Status: DC
  Administered 2016-08-12: 3 mL/kg/h via INTRAVENOUS

## 2016-08-12 MED ORDER — HYDROMORPHONE HCL 1 MG/ML IJ SOLN
INTRAMUSCULAR | Status: AC
  Filled 2016-08-12: qty 1

## 2016-08-12 MED ORDER — HEPARIN (PORCINE) IN NACL 2-0.9 UNIT/ML-% IJ SOLN
INTRAMUSCULAR | Status: AC
  Filled 2016-08-12: qty 1000

## 2016-08-12 MED ORDER — IOPAMIDOL (ISOVUE-370) INJECTION 76%
INTRAVENOUS | Status: AC
  Filled 2016-08-12: qty 100

## 2016-08-12 MED ORDER — SODIUM CHLORIDE 0.9 % WEIGHT BASED INFUSION: 1.0000 mL/kg/h | INTRAVENOUS | Status: DC

## 2016-08-12 MED ORDER — LIDOCAINE HCL (PF) 1 % IJ SOLN
INTRAMUSCULAR | Status: AC
  Filled 2016-08-12: qty 30

## 2016-08-12 MED ORDER — SODIUM CHLORIDE 0.9 % IV SOLN: 250.0000 mL | INTRAVENOUS | Status: DC | PRN

## 2016-08-12 MED ORDER — ASPIRIN 81 MG PO CHEW
CHEWABLE_TABLET | ORAL | Status: AC
  Filled 2016-08-12: qty 1

## 2016-08-12 MED ORDER — NITROGLYCERIN 1 MG/10 ML FOR IR/CATH LAB
INTRA_ARTERIAL | Status: AC
  Filled 2016-08-12: qty 10

## 2016-08-12 MED ORDER — VERAPAMIL HCL 2.5 MG/ML IV SOLN
INTRAVENOUS | Status: AC
  Filled 2016-08-12: qty 2

## 2016-08-12 MED ORDER — HYDROMORPHONE HCL 1 MG/ML IJ SOLN
INTRAMUSCULAR | Status: DC | PRN
  Administered 2016-08-12: 0.5 mg via INTRAVENOUS

## 2016-08-12 MED ORDER — HEPARIN SODIUM (PORCINE) 1000 UNIT/ML IJ SOLN
INTRAMUSCULAR | Status: DC | PRN
  Administered 2016-08-12: 3000 [IU] via INTRAVENOUS

## 2016-08-12 MED ORDER — SODIUM CHLORIDE 0.9% FLUSH: 3.0000 mL | Freq: Two times a day (BID) | INTRAVENOUS | Status: DC

## 2016-08-12 MED ORDER — MIDAZOLAM HCL 2 MG/2ML IJ SOLN
INTRAMUSCULAR | Status: AC
  Filled 2016-08-12: qty 2

## 2016-08-12 MED ORDER — HEPARIN (PORCINE) IN NACL 2-0.9 UNIT/ML-% IJ SOLN
INTRAMUSCULAR | Status: DC | PRN
  Administered 2016-08-12: 10:00:00

## 2016-08-12 MED ORDER — MIDAZOLAM HCL 2 MG/2ML IJ SOLN
INTRAMUSCULAR | Status: DC | PRN
  Administered 2016-08-12: 2 mg via INTRAVENOUS

## 2016-08-12 MED ORDER — ASPIRIN 81 MG PO CHEW
81.0000 mg | CHEWABLE_TABLET | ORAL | Status: AC
  Administered 2016-08-12: 81 mg via ORAL

## 2016-08-12 MED ORDER — SODIUM CHLORIDE 0.9% FLUSH: 3.0000 mL | INTRAVENOUS | Status: DC | PRN

## 2016-08-12 MED ORDER — HEPARIN SODIUM (PORCINE) 1000 UNIT/ML IJ SOLN
INTRAMUSCULAR | Status: AC
  Filled 2016-08-12: qty 1

## 2016-08-12 MED ORDER — IOPAMIDOL (ISOVUE-370) INJECTION 76%
INTRAVENOUS | Status: DC | PRN
  Administered 2016-08-12: 80 mL

## 2016-08-12 MED ORDER — VERAPAMIL HCL 2.5 MG/ML IV SOLN
INTRA_ARTERIAL | Status: DC | PRN
  Administered 2016-08-12: 7.5 mL via INTRA_ARTERIAL

## 2016-08-12 MED ORDER — LIDOCAINE HCL (PF) 1 % IJ SOLN
INTRAMUSCULAR | Status: DC | PRN
  Administered 2016-08-12 (×2): 2 mL

## 2016-08-12 SURGICAL SUPPLY — 14 items
CATH BALLN WEDGE 5F 110CM (CATHETERS) ×3 IMPLANT
CATH INFINITI 5 FR STR PIGTAIL (CATHETERS) ×3 IMPLANT
CATH OPTITORQUE TIG 4.0 5F (CATHETERS) ×3 IMPLANT
DEVICE RAD COMP TR BAND LRG (VASCULAR PRODUCTS) ×3 IMPLANT
GLIDESHEATH SLEND A-KIT 6F 20G (SHEATH) ×3 IMPLANT
GUIDEWIRE INQWIRE 1.5J.035X260 (WIRE) ×1 IMPLANT
INQWIRE 1.5J .035X260CM (WIRE) ×3
KIT HEART LEFT (KITS) ×3 IMPLANT
PACK CARDIAC CATHETERIZATION (CUSTOM PROCEDURE TRAY) ×3 IMPLANT
SHEATH FAST CATH BRACH 5F 5CM (SHEATH) ×3 IMPLANT
SYR MEDRAD MARK V 150ML (SYRINGE) ×3 IMPLANT
TRANSDUCER W/STOPCOCK (MISCELLANEOUS) ×3 IMPLANT
TUBING CIL FLEX 10 FLL-RA (TUBING) ×3 IMPLANT
WIRE HI TORQ VERSACORE-J 145CM (WIRE) ×3 IMPLANT

## 2016-08-12 NOTE — Interval H&P Note (Signed)
History and Physical Interval Note:  08/12/2016 9:15 AM  Marvin Houston  has presented today for surgery, with the diagnosis of aotric stenosis, cad, presurgical clearance  The various methods of treatment have been discussed with the patient and family. After consideration of risks, benefits and other options for treatment, the patient has consented to  Procedure(s): Right/Left Heart Cath and Coronary Angiography (N/A) as a surgical intervention .  The patient's history has been reviewed, patient examined, no change in status, stable for surgery.  I have reviewed the patient's chart and labs.  Questions were answered to the patient's satisfaction.     Adrian Prows

## 2016-08-12 NOTE — Discharge Instructions (Signed)
NO METFORMIN/GLUCOPHAGE FOR 2 DAYS ° ° ° ° °Radial Site Care °Introduction °Refer to this sheet in the next few weeks. These instructions provide you with information about caring for yourself after your procedure. Your health care provider may also give you more specific instructions. Your treatment has been planned according to current medical practices, but problems sometimes occur. Call your health care provider if you have any problems or questions after your procedure. °What can I expect after the procedure? °After your procedure, it is typical to have the following: °· Bruising at the radial site that usually fades within 1-2 weeks. °· Blood collecting in the tissue (hematoma) that may be painful to the touch. It should usually decrease in size and tenderness within 1-2 weeks. °Follow these instructions at home: °· Take medicines only as directed by your health care provider. °· You may shower 24-48 hours after the procedure or as directed by your health care provider. Remove the bandage (dressing) and gently wash the site with plain soap and water. Pat the area dry with a clean towel. Do not rub the site, because this may cause bleeding. °· Do not take baths, swim, or use a hot tub until your health care provider approves. °· Check your insertion site every day for redness, swelling, or drainage. °· Do not apply powder or lotion to the site. °· Do not flex or bend the affected arm for 24 hours or as directed by your health care provider. °· Do not push or pull heavy objects with the affected arm for 24 hours or as directed by your health care provider. °· Do not lift over 10 lb (4.5 kg) for 5 days after your procedure or as directed by your health care provider. °· Ask your health care provider when it is okay to: °¨ Return to work or school. °¨ Resume usual physical activities or sports. °¨ Resume sexual activity. °· Do not drive home if you are discharged the same day as the procedure. Have someone else  drive you. °· You may drive 24 hours after the procedure unless otherwise instructed by your health care provider. °· Do not operate machinery or power tools for 24 hours after the procedure. °· If your procedure was done as an outpatient procedure, which means that you went home the same day as your procedure, a responsible adult should be with you for the first 24 hours after you arrive home. °· Keep all follow-up visits as directed by your health care provider. This is important. °Contact a health care provider if: °· You have a fever. °· You have chills. °· You have increased bleeding from the radial site. Hold pressure on the site. °Get help right away if: °· You have unusual pain at the radial site. °· You have redness, warmth, or swelling at the radial site. °· You have drainage (other than a small amount of blood on the dressing) from the radial site. °· The radial site is bleeding, and the bleeding does not stop after 30 minutes of holding steady pressure on the site. °· Your arm or hand becomes pale, cool, tingly, or numb. °This information is not intended to replace advice given to you by your health care provider. Make sure you discuss any questions you have with your health care provider. °Document Released: 08/30/2010 Document Revised: 01/03/2016 Document Reviewed: 02/13/2014 °© 2017 [employer] ° °

## 2016-08-13 ENCOUNTER — Encounter: Payer: Self-pay | Admitting: Surgery

## 2016-08-13 ENCOUNTER — Institutional Professional Consult (permissible substitution) (INDEPENDENT_AMBULATORY_CARE_PROVIDER_SITE_OTHER): Payer: Medicare Other | Admitting: Surgery

## 2016-08-13 VITALS — BP 148/74 | HR 55 | Resp 16 | Ht 70.0 in | Wt 200.0 lb

## 2016-08-13 DIAGNOSIS — I350 Nonrheumatic aortic (valve) stenosis: Secondary | ICD-10-CM

## 2016-08-13 LAB — POCT I-STAT 3, VENOUS BLOOD GAS (G3P V)
Acid-base deficit: 2 mmol/L (ref 0.0–2.0)
BICARBONATE: 24 mmol/L (ref 20.0–28.0)
O2 SAT: 63 %
PCO2 VEN: 44 mmHg (ref 44.0–60.0)
PH VEN: 7.344 (ref 7.250–7.430)
TCO2: 25 mmol/L (ref 0–100)
pO2, Ven: 34 mmHg (ref 32.0–45.0)

## 2016-08-13 NOTE — Progress Notes (Signed)
Patient ID: Marvin Houston, male   DOB: 1933-12-13, 83 y.o.   MRN: [id number]  [facility] CONSULTATION REPORT  Referring Provider is Adrian Prows, MD PCP is Jani Gravel, MD  Chief Complaint  Patient presents with  . Aortic Stenosis    SEVERE...TEE12/19/17, CATH 08/12/16    HPI:  The patient is an 83 year old gentleman with hypertension, diabetes, hyperlipidemia, and moderate aortic stenosis followed by echo. In April 2017 he developed severe anemia with lower GI bleeding and colonoscopy found internal and external hemorrhoids as well as several large AVM's in the ascending colon and cecum that were not bleeding. UGI was negative and it was felt that the anemia was likely secondary to chronic disease and hemorrhoids. He did require transfusion. He reports that he has felt more fatigued over the past six months when he plays golf. He has shortness of breath with exertion. He denies any , orthopnea or PND. He denies chest pain and dizziness. He underwent a repeat echo on 06/11/2016 showing moderate concentric LVH with an EF of 58%. The aortic valve was reportedly moderately calcified and severe leaflet restriction and the mean AV gradient was 41 mm Hg with a peak of 68 mm Hg. The calculated AVA was 0.8 cm2. The mean gradient on echo in 11/2015 was 31 mm Hg. He subsequently underwent a TEE on 07/29/2016 showing a mean gradient of 49 mm Hg with a peak of 71 mm Hg and an EF of 55-60%. There was mild atheromatous plaque throughout the arch and proximal descending aorta. He underwent cath on 08/13/2015 showing 60% mid RCA stenosis that was unchanged from cath in 2007.   The patient is retired and lives at home with his wife. He stays active playing golf.  Past Medical History:  Diagnosis Date  . Hypertension     Past Surgical History:  Procedure Laterality Date  . TEE WITHOUT CARDIOVERSION N/A 07/29/2016   Procedure: TRANSESOPHAGEAL  ECHOCARDIOGRAM (TEE);  Surgeon: Adrian Prows, MD;  Location: [facility];  Service: Cardiovascular;  Laterality: N/A;    No family history on file.  Social History   Social History  . Marital status: Single    Spouse name: N/A  . Number of children: N/A  . Years of education: N/A   Occupational History  . Not on file.   Social History Main Topics  . Smoking status: Never Smoker  . Smokeless tobacco: Never Used  . Alcohol use Yes  . Drug use: No  . Sexual activity: Not on file   Other Topics Concern  . Not on file   Social History Narrative  . No narrative on file    Current Outpatient Prescriptions  Medication Sig Dispense Refill  . aspirin EC 81 MG tablet Take 81 mg by mouth every evening.     . Cyanocobalamin (VITAMIN B-12 PO) Take 1 tablet by mouth daily.    Marland Kitchen FeFum-FePoly-FA-B Cmp-C-Biot (FOLIVANE-PLUS PO) Take 1 tablet by mouth 2 (two) times daily.     Marland Kitchen lisinopril (PRINIVIL,ZESTRIL) 40 MG tablet Take 40 mg by mouth daily.    Marland Kitchen lovastatin (MEVACOR) 40 MG tablet Take 40 mg by mouth 2 (two) times a week. Mon and Tues    . metFORMIN (GLUCOPHAGE) 500 MG tablet Take 500 mg by mouth 2 (two) times daily with a meal.     . Multiple Vitamin (MULTIVITAMIN WITH MINERALS) TABS tablet Take 1 tablet by mouth daily.    Marland Kitchen  Multiple Vitamins-Minerals (VISION VITAMINS PO) Take 1 tablet by mouth daily.    . sildenafil (VIAGRA) 100 MG tablet Take 100 mg by mouth daily as needed for erectile dysfunction.     No current facility-administered medications for this visit.     No Known Allergies    Review of Systems:   General:  normal appetite, decreased energy, no weight gain, no weight loss, no fever  Cardiac:  no chest pain with exertion, no chest pain at rest, moderate SOB with exertion, no resting SOB, no PND, no orthopnea, no palpitations, no arrhythmia, no atrial fibrillation, no LE edema, no dizzy spells, no syncope  Respiratory:  exertional shortness of breath, no home oxygen,  no productive cough, no dry cough, no bronchitis, no wheezing, no hemoptysis, no asthma, no pain with inspiration or cough, no sleep apnea, no CPAP at night  GI:   no difficulty swallowing, no reflux, no frequent heartburn, no hiatal hernia, no abdominal pain, no constipation, no diarrhea, no hematochezia, no hematemesis, no melena, Has BRBPR.  GU:   no dysuria,  no frequency, hx of urinary tract infection, no hematuria, no enlarged prostate, no kidney stones, no kidney disease  Vascular:  no pain suggestive of claudication, no pain in feet, nighttime leg cramps, no varicose veins, no DVT, no non-healing foot ulcer  Neuro:   no stroke, no TIA's, no seizures, no headaches, no temporary blindness one eye,  no slurred speech, no peripheral neuropathy, no chronic pain, no instability of gait, no memory/cognitive dysfunction  Musculoskeletal: no arthritis, no joint swelling, no myalgias, no difficulty walking, no mobility   Skin:   no rash, no itching, no skin infections, no pressure sores or ulcerations  Psych:   no anxiety, no depression, no nervousness, no unusual recent stress  Eyes:   no blurry vision, no floaters, has recent vision changes,  wears glasses   ENT:   no hearing loss, no loose or painful teeth, no dentures, last saw dentist 07/2016  Hematologic:  has easy bruising, no abnormal bleeding, no clotting disorder, no frequent epistaxis  Endocrine:  has diabetes, does  check CBG's at home       Physical Exam:   BP (!) 148/74 (BP Location: Left Arm, Patient Position: Sitting, Cuff Size: Large)   Pulse (!) 55   Resp 16   Ht 5\' 10"  (1.778 m)   Wt 200 lb (90.7 kg)   SpO2 99% Comment: ON RA  BMI 28.70 kg/m   General:  Elderly but  well-appearing  HEENT:  Unremarkable, NCAT, PERLA, EOMI, oropharynx clear  Neck:   no JVD, no bruits, no adenopathy or thyromegaly  Chest:   clear to auscultation, symmetrical breath sounds, no wheezes, no rhonchi   CV:   RRR, grade III/VI  crescendo/decrescendo murmur heard best at RSB,  no diastolic murmur  Abdomen:  soft, non-tender, no masses or organomegaly  Extremities:  warm, well-perfused, pulses palpable in feet, no LE edema  Rectal/GU  Deferred  Neuro:   Grossly non-focal and symmetrical throughout  Skin:   Clean and dry, no rashes, no breakdown   Diagnostic Tests:            *[place]*                   *[facility]*                         [address]  [place], [place] [address]                            [phone]  ------------------------------------------------------------------- Transesophageal Echocardiography  Patient:    Jamonie, Mense MR #:       [id number] Study Date: 07/29/2016 Gender:     M Age:        78 Height:     177.8 cm Weight:     93 kg BSA:        2.17 m^2 Pt. Status: Room:   ATTENDING    Adrian Prows, MD  PERFORMING   Adrian Prows, MD  SONOGRAPHER  Florentina Jenny, RDCS  ORDERING     Kela Millin R  cc:  ------------------------------------------------------------------- LV EF: 55% -   60%  ------------------------------------------------------------------- Indications:      Aortic stenosis 424.1.  ------------------------------------------------------------------- History:   Risk factors:  Hypertension.  ------------------------------------------------------------------- Study Conclusions  - Left ventricle: There was mild concentric hypertrophy. Systolic   function was normal. The estimated ejection fraction was in the   range of 55% to 60%. - Aortic valve: Mildly calcified annulus. Trileaflet; severely   calcified leaflets. There was moderate to severe stenosis. Mean   gradient (S): 49 mm Hg. Peak gradient (S): 71 mm Hg. Valve area   (VTI): 1.23 cm^2. Valve area (Vmax): 1.4 cm^2. Valve area   (Vmean): 1.24 cm^2. LVOT measures 2.5 cm. - Aorta: There was mild atheromatous plaque. There was a localized   ectatic  segment, in the proximal ascending aorta. and measures   3.9 cm. Sino tubular junctino is normal in size. - Mitral valve: No evidence of vegetation. There was mild   regurgitation. - Left atrium: The atrium was dilated. - Right atrium: No evidence of thrombus in the atrial cavity or   appendage. - Atrial septum: No defect or patent foramen ovale was identified   by color Doppler exam.  ------------------------------------------------------------------- Study data:   Study status:  Routine.  Consent:  The risks, benefits, and alternatives to the procedure were explained to the patient and informed consent was obtained.  Procedure:  The patient reported no pain pre or post test. Initial setup. The patient was brought to the laboratory. Surface ECG leads were monitored. Sedation. Conscious sedation was administered by cardiology staff. Transesophageal echocardiography. An adult multiplane transesophageal probe was inserted by the attending cardiologistwithout difficulty. Image quality was adequate.  Study completion:  The patient tolerated the procedure well. There were no complications.  Administered medications:   Midazolam, 2mg , IV. Fentanyl, 67mcg, IV.          Diagnostic transesophageal echocardiography.  2D and color Doppler.  Birthdate:  Patient birthdate: April 05, 1934.  Age:  Patient is 83 yr old.  Sex:  Gender: male.    BMI: 29.4 kg/m^2.  Blood pressure:     158/68  Patient status:  Outpatient.  Study date:  Study date: 07/29/2016. Study time: 12:13 PM.  Location:  Endoscopy.  -------------------------------------------------------------------  ------------------------------------------------------------------- Left ventricle:  There was mild concentric hypertrophy. Systolic function was normal. The estimated ejection fraction was in the range of 55% to 60%.  ------------------------------------------------------------------- Aortic valve:   Mildly calcified annulus.  Trileaflet; severely calcified leaflets.  Doppler:   There was moderate to severe stenosis.    Doppler assessment for regurgitation was not performed.      VTI ratio of LVOT to aortic valve: 0.25. Valve area (VTI): 1.23 cm^2. Indexed valve area (VTI): 0.57 cm^2/m^2. Peak velocity ratio  of LVOT to aortic valve: 0.29. Valve area (Vmax): 1.4 cm^2. Indexed valve area (Vmax): 0.65 cm^2/m^2. Mean velocity ratio of LVOT to aortic valve: 0.25. Valve area (Vmean): 1.24 cm^2. Indexed valve area (Vmean): 0.57 cm^2/m^2.    Mean gradient (S): 49 mm Hg. Peak gradient (S): 71 mm Hg.  ------------------------------------------------------------------- Aorta:  There was mild atheromatous plaque. There was a localized ectatic segment, in the proximal ascending aorta. and measures 3.9 cm. Sino tubular junctino is normal in size.  ------------------------------------------------------------------- Mitral valve:   Structurally normal valve.   Leaflet separation was normal.  No evidence of vegetation.  Doppler:  There was mild regurgitation.  ------------------------------------------------------------------- Left atrium:  The atrium was dilated. The appendage was morphologically a left appendage.  ------------------------------------------------------------------- Atrial septum:  No defect or patent foramen ovale was identified by color Doppler exam.  ------------------------------------------------------------------- Right ventricle:  The cavity size was normal. Wall thickness was normal. Systolic function was normal.  ------------------------------------------------------------------- Pulmonic valve:    Structurally normal valve.   Cusp separation was normal.  Doppler:  There was trivial regurgitation.  ------------------------------------------------------------------- Tricuspid valve:   Structurally normal valve.   Leaflet separation was normal.  Doppler:  There was trivial  regurgitation.  ------------------------------------------------------------------- Right atrium:  The atrium was normal in size.  No evidence of thrombus in the atrial cavity or appendage.  ------------------------------------------------------------------- Pericardium:  The pericardium was normal in appearance. There was no pericardial effusion.  ------------------------------------------------------------------- Measurements   Left ventricle                           Value  Stroke volume, 2D                        141   ml  Stroke volume/bsa, 2D                    65    ml/m^2    LVOT                                     Value  LVOT ID, S                               25    mm  LVOT area                                4.91  cm^2  LVOT peak velocity, S                    120   cm/s  LVOT mean velocity, S                    84.1  cm/s  LVOT VTI, S                              28.7  cm  LVOT peak gradient, S                    6     mm Hg    Aortic valve  Value  Aortic valve peak velocity, S            421   cm/s  Aortic valve mean velocity, S            334   cm/s  Aortic valve VTI, S                      115   cm  Aortic mean gradient, S                  49    mm Hg  Aortic peak gradient, S                  71    mm Hg  VTI ratio, LVOT/AV                       0.25  Aortic valve area, VTI                   1.23  cm^2  Aortic valve area/bsa, VTI               0.57  cm^2/m^2  Velocity ratio, peak, LVOT/AV            0.29  Aortic valve area, peak velocity         1.4   cm^2  Aortic valve area/bsa, peak velocity     0.65  cm^2/m^2  Velocity ratio, mean, LVOT/AV            0.25  Aortic valve area, mean velocity         1.24  cm^2  Aortic valve area/bsa, mean velocity     0.57  cm^2/m^2  Legend: (L)  and  (H)  mark values outside specified reference range.  ------------------------------------------------------------------- Prepared and  Electronically Authenticated by  Adrian Prows, MD 2017-12-19T18:38:12   [name]  Cardiac catheterization  Order# [id number]  Reading physician: Adrian Prows, MD Ordering physician: Adrian Prows, MD Study date: 08/12/16  Physicians   Panel Physicians Referring Physician Case Authorizing Physician  Adrian Prows, MD (Primary)    Procedures   Right Heart Cath and Coronary Angiography  Conclusion   1. LV gram not performed due to inability to cross the calcified aortic valve. 2. Heavy calcification of the aortic valve annulus and mitral valve annular calcification. 3. Mild coronary calcification without high-grade stenosis in the coronary arteries involving the left system. Mid RCA has a smooth 60% stenosis, unchanged from 2007 angiography. 4. Smooth ascending, arch and descending thoracic aorta without aneurysm formation. 5. Right heart catheterization revealing mild pulmonary hypertension, preserved chronic output and cardiac index.  Recommendation: Patient will be evaluated for aortic valve replacement. He has an appointment with Dr. Ricki Miller tomorrow. A total of 80 mL contrast utilized.  Procedural Details/Technique   Technical Details Procedure performed:  Left heart catheterization including ascending aortogram, selective right and left coronary arteriography. Right heart catheterization and cannulation of cardiac output and cardiac index by Fick.  Indication: ERSEL KLEEMAN is a 83 y.o. male with with history of hypertension, hyperlipidemia, Patient with known moderate coronary artery disease involving the RCA by: Angiography in 2007 with a 60% stenosis. At recent TEE has confirmed severe aortic stenosis by mean pressure gradient criteria of 49 mmHg, peak gradient 71 mmHg, but continuity aortic valve area was 1.23 cm. He has been complaining of new onset of shortness of breath and dyspnea on exertion. Hence now  brought for right and left heart catheterization and for possible need for aortic  valve replacement.  Right AC vein access for right heart and Right radial access for left heart catheterization was utilized for performing the procedure.   Technique:   A 5 French sheath introduced into right right AC vein for access under fluroscopic guidance. A 5 French Swan-Ganz catheter was advanced with balloon inflated under fluoroscopic guidance into first the right atrium followed by the right ventricle and into the pulmonary artery to pulmonary artery wedge position. Hemodynamics were obtained in a locations.  After hemodynamics were completed, samples were taken for SaO2% measurement to be used in Fick Calculation of cardiac output and cardiac index. The catheter was then pulled back the balloon down and then completely out of the body. Hemostasis obtained by manual pressure over the access site.  Under sterile precautions using a 6 French right radial arterial access, a 6 French sheath was introduced into the right radial artery. A 5 [place] Tig 4 catheter was advanced into the ascending aorta selective right coronary artery and left coronary artery was cannulated and angiography was performed in multiple views. The catheter was pulled back Out of the body over exchange length J-wire.   I was unable to perform LV gram due to inability to cross the aortic valve due to heavy calcification and catheter bounce and shipping probably due to aortic stenosis.   A pigtail catheter was utilized to perform ascending aortogram. Catheter exchanged out of the body over J-Wire. NO immediate complications noted. Patient tolerated the procedure well.   Estimated blood loss <50 mL.  During this procedure the patient was administered the following to achieve and maintain moderate conscious sedation: Versed 2 mg, Dilaudid 0.5 mg, while the patient's heart rate, blood pressure, and oxygen saturation were continuously monitored. The period of conscious sedation was 35 minutes, of which I was present  face-to-face 100% of this time.    Coronary Findings   Dominance: Right  Left Main  Ost LM to LM lesion, 5% stenosed. The lesion is calcified.  Right Coronary Artery  Prox RCA to Mid RCA lesion, 60% stenosed. The lesion is smooth. No change from 2007  Right Heart   Right Heart Pressures RA 11/8, mean 6 mmHg RV 40/2, EDP 10 mmHg PA 37/13, mean 22 mmHg. PA saturation 63%. PW 16/11, mean 10 mmHg. Aortic saturation 95%. Cardiac output 5.81, cardiac index 2.78, normal.    Left Heart   Mitral Valve The annulus is calcified.    Aortic Valve The aortic valve is calcified.    Coronary Diagrams   Diagnostic Diagram     Implants     No implant documentation for this case.  PACS Images   Show images for Cardiac catheterization   Link to Procedure Log   Procedure Log    Hemo Data   Flowsheet Row Most Recent Value  Fick Cardiac Output 5.81 L/min  Fick Cardiac Output Index 2.78 (L/min)/BSA  RA A Wave 11 mmHg  RA V Wave 8 mmHg  RA Mean 6 mmHg  RV Systolic Pressure 40 mmHg  RV Diastolic Pressure 2 mmHg  RV EDP 10 mmHg  PA Systolic Pressure 37 mmHg  PA Diastolic Pressure 13 mmHg  PA Mean 22 mmHg  PW A Wave 16 mmHg  PW V Wave 11 mmHg  PW Mean 10 mmHg  AO Systolic Pressure 123456 mmHg  AO Diastolic Pressure 68 mmHg  AO Mean 95 mmHg  QP/QS 1  TPVR Index  7.91 HRUI  TSVR Index 34.19 HRUI  PVR SVR Ratio 0.13  TPVR/TSVR Ratio 0.23     RISK SCORES About the STS Risk Calculator Procedure: AV Replacement + CAB  Risk of Mortality: 2.51%  Morbidity or Mortality: 18.142%  Long Length of Stay: 9.28%  Short Length of Stay: 29.072%  Permanent Stroke: 1.734%  Prolonged Ventilation: 10.188%  DSW Infection: 0.397%  Renal Failure: 6.182%  Reoperation: 7.746%   Impression:  This 83 year old gentleman has stage D, severe, symptomatic aortic stenosis with fatigue and shortness of breath with moderate exertion, NYHA class II. I have personally reviewed his 2D echo, TEE and cath  films. He has a trileaflet aortic valve that is severely calcified with severe aortic stenosis and a mean gradient of 49 mm Hg by TEE and 41 mm Hg by 2D echo. LV function is normal. He has a 60% mid RCA stenosis but has had no chest pain. I think aortic valve replacement is indicated in this patient with progressive symptoms and severe AS. He would be a candidate for open surgical AVR and CABG to the RCA but given his age he would like to avoid open surgery if possible. I think TAVR may be a reasonable alternative for him given his age. His RCA stenosis could be treated medically for now and has been stable for a long time. His STS PROM score puts him in the low risk group for open surgery but given his age I think recovery from open surgery would be longer and more difficult for him. He does have significant atheromatous plaque in his aortic arch and descending aorta on TEE which may increase his risk of stroke with aortic cannulation for open surgery. I think it is worth getting a gated cardiac CT and CTA of the chest, abdomen and pelvis to see if he would be a candidate for TAVR and then have evaluated by the other members of the team.   The patient and his wife were counseled at length regarding treatment alternatives for management of severe symptomatic aortic stenosis. Alternative approaches such as conventional aortic valve replacement, transcatheter aortic valve replacement, and palliative medical therapy were compared and contrasted at length. The risks associated with conventional surgical aortic valve replacement were been discussed in detail, as were expectations for post-operative convalescence. Long-term prognosis with medical therapy was discussed.  I reviewed all of the data at this time with the patient and his wife and answered their questions. He would like to proceed with TAVR workup but understands that he may not be a candidate for TAVR depending on the results of further evaluation.     Plan:  The patient will be scheduled for a gated cardiac CT and CTA of the chest, abdomen and pelvis. He will then return for evaluation by one of the interventional cardiologists on the team and have a second surgical evaluation.  I spent 60 minutes performing this consultation and > 50% of this time was spent face to face counseling and coordinating the care of this patient's severe aortic stenosis.  Gaye Pollack, MD 08/13/2016 2:57 PM

## 2016-08-14 ENCOUNTER — Other Ambulatory Visit: Payer: Self-pay | Admitting: *Deleted

## 2016-08-14 DIAGNOSIS — I350 Nonrheumatic aortic (valve) stenosis: Secondary | ICD-10-CM

## 2016-08-15 ENCOUNTER — Other Ambulatory Visit: Payer: Self-pay | Admitting: *Deleted

## 2016-08-15 DIAGNOSIS — I350 Nonrheumatic aortic (valve) stenosis: Secondary | ICD-10-CM

## 2016-08-25 ENCOUNTER — Ambulatory Visit (HOSPITAL_COMMUNITY)
Admission: RE | Admit: 2016-08-25 | Discharge: 2016-08-25 | Disposition: A | Discharge status: Home / Self Care | Payer: Medicare Other | Source: Ambulatory Visit | Attending: Surgery | Admitting: Surgery

## 2016-08-25 ENCOUNTER — Ambulatory Visit: Payer: Medicare Other | Attending: Surgery | Admitting: Physical Therapy

## 2016-08-25 ENCOUNTER — Encounter: Payer: Self-pay | Admitting: Physical Therapy

## 2016-08-25 DIAGNOSIS — I700 Atherosclerosis of aorta: Secondary | ICD-10-CM | POA: Insufficient documentation

## 2016-08-25 DIAGNOSIS — R918 Other nonspecific abnormal finding of lung field: Secondary | ICD-10-CM | POA: Insufficient documentation

## 2016-08-25 DIAGNOSIS — R293 Abnormal posture: Secondary | ICD-10-CM | POA: Diagnosis not present

## 2016-08-25 DIAGNOSIS — M6281 Muscle weakness (generalized): Secondary | ICD-10-CM | POA: Diagnosis not present

## 2016-08-25 DIAGNOSIS — I350 Nonrheumatic aortic (valve) stenosis: Secondary | ICD-10-CM | POA: Insufficient documentation

## 2016-08-25 DIAGNOSIS — I2510 Atherosclerotic heart disease of native coronary artery without angina pectoris: Secondary | ICD-10-CM | POA: Insufficient documentation

## 2016-08-25 DIAGNOSIS — Z01818 Encounter for other preprocedural examination: Secondary | ICD-10-CM | POA: Diagnosis not present

## 2016-08-25 LAB — PULMONARY FUNCTION TEST
DL/VA % PRED: 59 %
DL/VA: 2.73 ml/min/mmHg/L
DLCO UNC: 15.42 ml/min/mmHg
DLCO unc % pred: 47 %
FEF 25-75 Post: 3.71 L/sec
FEF 25-75 Pre: 2.1 L/sec
FEF2575-%Change-Post: 76 %
FEF2575-%Pred-Post: 190 %
FEF2575-%Pred-Pre: 107 %
FEV1-%CHANGE-POST: 10 %
FEV1-%PRED-PRE: 97 %
FEV1-%Pred-Post: 108 %
FEV1-POST: 3.08 L
FEV1-PRE: 2.79 L
FEV1FVC-%Change-Post: 8 %
FEV1FVC-%Pred-Pre: 105 %
FEV6-%Change-Post: 2 %
FEV6-%PRED-POST: 99 %
FEV6-%PRED-PRE: 97 %
FEV6-POST: 3.73 L
FEV6-PRE: 3.63 L
FEV6FVC-%CHANGE-POST: 1 %
FEV6FVC-%PRED-POST: 106 %
FEV6FVC-%PRED-PRE: 105 %
FVC-%Change-Post: 1 %
FVC-%Pred-Post: 93 %
FVC-%Pred-Pre: 92 %
FVC-POST: 3.77 L
FVC-PRE: 3.71 L
POST FEV6/FVC RATIO: 99 %
PRE FEV6/FVC RATIO: 98 %
Post FEV1/FVC ratio: 82 %
Pre FEV1/FVC ratio: 75 %
RV % PRED: 83 %
RV: 2.23 L
TLC % PRED: 84 %
TLC: 5.97 L

## 2016-08-25 LAB — BASIC METABOLIC PANEL
Anion gap: 7 (ref 5–15)
BUN: 16 mg/dL (ref 6–20)
CO2: 25 mmol/L (ref 22–32)
Calcium: 9.4 mg/dL (ref 8.9–10.3)
Chloride: 107 mmol/L (ref 101–111)
Creatinine, Ser: 1.14 mg/dL (ref 0.61–1.24)
GFR calc Af Amer: >60 mL/min (ref >60)
GFR calc non Af Amer: 59 mL/min — ABNORMAL LOW (ref >60)
Glucose, Bld: 180 mg/dL — ABNORMAL HIGH (ref 65–99)
Potassium: 4 mmol/L (ref 3.5–5.1)
Sodium: 139 mmol/L (ref 135–145)

## 2016-08-25 MED ORDER — IOPAMIDOL (ISOVUE-370) INJECTION 76%
INTRAVENOUS | Status: AC
  Administered 2016-08-25: 80 mL
  Filled 2016-08-25: qty 100

## 2016-08-25 MED ORDER — ALBUTEROL SULFATE (2.5 MG/3ML) 0.083% IN NEBU
2.5000 mg | INHALATION_SOLUTION | Freq: Once | RESPIRATORY_TRACT | Status: AC
  Administered 2016-08-25: 2.5 mg via RESPIRATORY_TRACT

## 2016-08-25 MED ORDER — IOPAMIDOL (ISOVUE-370) INJECTION 76%
INTRAVENOUS | Status: AC
  Filled 2016-08-25: qty 50

## 2016-08-25 NOTE — Therapy (Signed)
[place], [place], [address] Phone: [phone]   Fax:  [phone]  Physical Therapy Evaluation  Patient Details  Name: Marvin Houston MRN: [id number] Date of Birth: Apr 03, 1934 Referring Provider: Dr. Gilford Raid  Encounter Date: 08/25/2016      PT End of Session - 08/25/16 1307    Visit Number 1   PT Start Time [id number]   PT Stop Time 1345   PT Time Calculation (min) 40 min      Past Medical History:  Diagnosis Date  . Hypertension     Past Surgical History:  Procedure Laterality Date  . TEE WITHOUT CARDIOVERSION N/A 07/29/2016   Procedure: TRANSESOPHAGEAL ECHOCARDIOGRAM (TEE);  Surgeon: Adrian Prows, MD;  Location: [facility] ENDOSCOPY;  Service: Cardiovascular;  Laterality: N/A;    There were no vitals filed for this visit.       Subjective Assessment - 08/25/16 1311    Subjective Pt reports they have been monitoring his aortic stenosis for 8-10 years with biannual check-ups. Recent TEE results led to recommendation of aortic valve replacement. Pt reports that he doesn't really notice shortness of breath too much right now.    Patient Stated Goals keep playing golf and stay active   Multiple Pain Sites No            [facility] PT Assessment - 08/25/16 0001      Assessment   Medical Diagnosis severe aortic stenosis   Referring Provider Dr. Gilford Raid   Onset Date/Surgical Date 08/13/16     Precautions   Precautions None     Restrictions   Weight Bearing Restrictions No     Balance Screen   Has the patient fallen in the past 6 months No   Has the patient had a decrease in activity level because of a fear of falling?  No   Is the patient reluctant to leave their home because of a fear of falling?  No     Home Environment   Living Environment Private residence   Living Arrangements Spouse/significant other   Type of [place] Two level  bedroom downstairs but office upstairs      Posture/Postural Control   Posture/Postural Control Postural limitations   Postural Limitations Rounded Shoulders;Forward head     ROM / Strength   AROM / PROM / Strength AROM;Strength     AROM   Overall AROM Comments grossly WNL     Strength   Overall Strength Comments grossly 4+/5 except R hip flexion 4-/5   Strength Assessment Site Hand   Right/Left hand Right;Left   Right Hand Grip (lbs) 78  R hand dominant   Left Hand Grip (lbs) 60     Ambulation/Gait   Gait Comments No significant gait deviations other than oxygen desaturation and shortness of breath.           [facility] Pre-Surgical Assessment - 08/25/16 0001    5 Meter Walk Test- trial 1 4 sec   5 Meter Walk Test- trial 2 4 sec.    5 Meter Walk Test- trial 3 3 sec.   5 meter walk test average 3.67 sec   4 Stage Balance Test tolerated for:  7 sec.   4 Stage Balance Test Position 4   Sit To Stand Test- trial 1 24 sec.   ADL/IADL Independent with: Bathing;Dressing;Meal prep;Finances   ADL/IADL Needs Assistance with: [employer] work   ADL/IADL Fraility Index Vulnerable   6 Minute  Walk- Baseline yes   BP (mmHg) 150/74   HR (bpm) 64   02 Sat (%RA) 99 %   Modified Borg Scale for Dyspnea 0- Nothing at all   Perceived Rate of Exertion (Borg) 6-   6 Minute Walk Post Test yes   BP (mmHg) 138/63   HR (bpm) 102   02 Sat (%RA) 87 %   Modified Borg Scale for Dyspnea 3- Moderate shortness of breath or breathing difficulty   Perceived Rate of Exertion (Borg) 13- Somewhat hard   Aerobic Endurance Distance Walked 1275   Endurance additional comments Pt required one standing rest break around 3 minutes due to oxygen dropping to 87%. He recovered in 10-15 seconds and able to resume. No other rest breaks.                                      Plan - 08-30-16 1419    Clinical Impression Statement see clinical impression statement   PT Frequency One time visit   Consulted and Agree with Plan of Care  Patient     Clinical Impression Statement: Pt is an 83 yo male presenting to OP PT for evaluation prior to possible TAVR surgery due to severe aortic stenosis. Pt reports he has been closely monitored for aortic stenosis for 8-10 years and recent exam led to recommended TEE and then decision to pursue intervention. Pt reports he doesn't notice SOB much other than walking uphill. Pt did notice SOB during PT evaluation today. Pt presents with good ROM and strength with mild deficit with R hip flexion, good balance and is not at high fall risk 4 stage balance test, good walking speed and fair aerobic endurance per 6 minute walk test. Pt was able to walk almost normal distance however with each 3 minutes of walking, his O2 dropped to 87% and required a short rest break to recover.  Pt ambulated a total of 1275 feet in 6 minute walk. Based on the Short Physical Performance Battery, patient has a frailty rating of 9/12 with </= 5/12 considered frail.   Patient demonstrated the following deficits and impairments:     Visit Diagnosis: Muscle weakness (generalized)  Abnormal posture      G-Codes - August 30, 2016 1420    Functional Assessment Tool Used 6 minute walk 1275'   Functional Limitation Mobility: Walking and moving around   Mobility: Walking and Moving Around Current Status [phone]) At least 1 percent but less than 20 percent impaired, limited or restricted   Mobility: Walking and Moving Around Goal Status [phone]) At least 1 percent but less than 20 percent impaired, limited or restricted   Mobility: Walking and Moving Around Discharge Status [phone]) At least 1 percent but less than 20 percent impaired, limited or restricted       Problem List Patient Active Problem List   Diagnosis Date Noted  . Aortic stenosis, severe 08/09/2016    NICOLETTA,DANA August 30, 2016, 2:20 PM  [facility] [address] [place], [place], [address] Phone: [phone]    Fax:  [phone]  Name: Marvin Houston MRN: [id number] Date of Birth: 1934/06/14

## 2016-08-28 ENCOUNTER — Encounter: Payer: Self-pay | Admitting: *Deleted

## 2016-08-28 ENCOUNTER — Other Ambulatory Visit (HOSPITAL_COMMUNITY): Payer: Self-pay | Admitting: *Deleted

## 2016-08-28 ENCOUNTER — Institutional Professional Consult (permissible substitution) (INDEPENDENT_AMBULATORY_CARE_PROVIDER_SITE_OTHER): Payer: Medicare Other | Admitting: Cardiothoracic Surgery

## 2016-08-28 ENCOUNTER — Encounter: Payer: Self-pay | Admitting: Cardiothoracic Surgery

## 2016-08-28 ENCOUNTER — Inpatient Hospital Stay (HOSPITAL_COMMUNITY)
Admission: RE | Admit: 2016-08-28 | Discharge: 2016-08-28 | Disposition: A | Discharge status: Home / Self Care | Payer: Medicare Other | Source: Ambulatory Visit

## 2016-08-28 VITALS — BP 127/66 | HR 67 | Resp 16 | Ht 70.0 in | Wt 200.0 lb

## 2016-08-28 DIAGNOSIS — I350 Nonrheumatic aortic (valve) stenosis: Secondary | ICD-10-CM | POA: Diagnosis not present

## 2016-08-28 NOTE — Progress Notes (Signed)
Pt did not arrive for his 1:00 PAT appt. Called him and left message. He returned my call and stated he didn't know he had an appt at 1:00, he knew he had a 3:00 appt with the surgeon. I asked him if he could get here today before going to the 3:00 appt but he didn't feel that he could. I told him our scheduler would call him to possibly schedule an appt for him tomorrow.

## 2016-08-28 NOTE — Pre-Procedure Instructions (Signed)
Marvin Houston  08/28/2016    Your procedure is scheduled on Tuesday, September 02, 2016 at 7:30 AM.   Report to [facility] Entrance "A" Admitting Office at 5:30 AM.   Call this number if you have problems the morning of surgery: [phone]   Questions prior to day of surgery, please call [phone] between 8 & 4 PM.   Remember:  Do not eat food or drink liquids after midnight Monday, 09/01/16.  Take these medicines the morning of surgery with A SIP OF WATER: Tylenol - if needed  Stop Multivitamins as of today.   How to Manage Your Diabetes Before Surgery   Why is it important to control my blood sugar before and after surgery?   Improving blood sugar levels before and after surgery helps healing and can limit problems.  A way of improving blood sugar control is eating a healthy diet by:  - Eating less sugar and carbohydrates  - Increasing activity/exercise  - Talk with your doctor about reaching your blood sugar goals  High blood sugars (greater than 180 mg/dL) can raise your risk of infections and slow down your recovery so you will need to focus on controlling your diabetes during the weeks before surgery.  Make sure that the doctor who takes care of your diabetes knows about your planned surgery including the date and location.  How do I manage my blood sugars before surgery?   Check your blood sugar at least 4 times a day, 2 days before surgery to make sure that they are not too high or low.  Check your blood sugar the morning of your surgery when you wake up and every 2 hours until you get to the Short-Stay unit.  Treat a low blood sugar (less than 70 mg/dL) with 1/2 cup of clear juice (cranberry or apple), 4 glucose tablets, OR glucose gel.  Recheck blood sugar in 15 minutes after treatment (to make sure it is greater than 70 mg/dL).  If blood sugar is not greater than 70 mg/dL on re-check, call [phone] for further instructions.   Report your blood  sugar to the Short-Stay nurse when you get to Short-Stay.  References:  [facility], 2007 "How to Manage your Diabetes Before and After Surgery".  What do I do about my diabetes medications?   Do not take oral diabetes medicines (pills) the morning of surgery.   Do not wear jewelry.  Do not wear lotions, powders, cologne or deodorant.  Men may shave face and neck.  Do not bring valuables to the hospital.  [facility] is not responsible for any belongings or valuables.  Contacts, dentures or bridgework may not be worn into surgery.  Leave your suitcase in the car.  After surgery it may be brought to your room.  For patients admitted to the hospital, discharge time will be determined by your treatment team.  Special instructions:  [place] - Preparing for Surgery  Before surgery, you can play an important role.  Because skin is not sterile, your skin needs to be as free of germs as possible.  You can reduce the number of germs on you skin by washing with CHG (chlorahexidine gluconate) soap before surgery.  CHG is an antiseptic cleaner which kills germs and bonds with the skin to continue killing germs even after washing.  Please DO NOT use if you have an allergy to CHG or antibacterial soaps.  If your skin becomes reddened/irritated stop using  the CHG and inform your nurse when you arrive at Short Stay.  Do not shave (including legs and underarms) for at least 48 hours prior to the first CHG shower.  You may shave your face.  Please follow these instructions carefully:   1.  Shower with CHG Soap the night before surgery and the                    morning of Surgery.  2.  If you choose to wash your hair, wash your hair first as usual with your       normal shampoo.  3.  After you shampoo, rinse your hair and body thoroughly to remove the shampoo.  4.  Use CHG as you would any other liquid soap.  You can apply chg directly       to the skin and wash gently  with scrungie or a clean washcloth.  5.  Apply the CHG Soap to your body ONLY FROM THE NECK DOWN.        Do not use on open wounds or open sores.  Avoid contact with your eyes, ears, mouth and genitals (private parts).  Wash genitals (private parts) with your normal soap.  6.  Wash thoroughly, paying special attention to the area where your surgery        will be performed.  7.  Thoroughly rinse your body with warm water from the neck down.  8.  DO NOT shower/wash with your normal soap after using and rinsing off       the CHG Soap.  9.  Pat yourself dry with a clean towel.            10.  Wear clean pajamas.            11.  Place clean sheets on your bed the night of your first shower and do not        sleep with pets.  Day of Surgery  Do not apply any lotions/deodorants the morning of surgery.  Please wear clean clothes to the hospital.   Please read over the fact sheets that you were given.

## 2016-08-28 NOTE — Progress Notes (Signed)
[address]       [place] [address]             [phone]                    Marvin Houston Medical Record [id number] Date of Birth: 1933/12/19  Referring: Adrian Prows, MD Primary Care: Jani Gravel, MD  Chief Complaint:    Chief Complaint  Patient presents with  . Aortic Stenosis    TEE 07/29/16, CATH 08/12/16, PFT, CTA C/A/P, COR. MORPH, PT EVAL 1/15/118    History of Present Illness:    Marvin Houston 83 y.o. male is seen in the office  today for AS. He has  stage D, severe, symptomatic aortic stenosis with fatigue and shortness of breath with moderate exertion, NYHA class II.  He has a functional bicuspid  aortic valve (by CT)  that is severely calcified with severe aortic stenosis and a mean gradient of 49 mm Hg by TEE and 41 mm Hg by 2D echo. LV function is normal. He has a 60% mid RCA stenosis but has had no chest pain.   The patient has been followed for aortic stenosis and now has become significantly symptomatic.    Current Activity/ Functional Status:  Patient is independent with mobility/ambulation, transfers, ADL's, IADL's.   Zubrod Score: At the time of surgery this patient's most appropriate activity status/level should be described as: []     0    Normal activity, no symptoms [x]     1    Restricted in physical strenuous activity but ambulatory, able to do out light work []     2    Ambulatory and capable of self care, unable to do work activities, up and about               >50 % of waking hours                              []     3    Only limited self care, in bed greater than 50% of waking hours []     4    Completely disabled, no self care, confined to bed or chair []     5    Moribund   Past Medical History:  Diagnosis Date  . Hypertension     Past Surgical History:  Procedure Laterality Date  . TEE WITHOUT CARDIOVERSION N/A 07/29/2016   Procedure: TRANSESOPHAGEAL ECHOCARDIOGRAM (TEE);  Surgeon: Adrian Prows, MD;  Location:  [facility] ENDOSCOPY;  Service: Cardiovascular;  Laterality: N/A;    No family history on file.  Social History   Social History  . Marital status: Married    Spouse name: N/A  . Number of children: N/A  . Years of education: N/A   Occupational History  . Not on file.   Social History Main Topics  . Smoking status: Never Smoker  . Smokeless tobacco: Never Used  . Alcohol use Yes  . Drug use: No  . Sexual activity: Not on file   Other Topics Concern  . Not on file   Social History Narrative  . No narrative on file    History  Smoking Status  . Never Smoker  Smokeless Tobacco  . Never Used    History  Alcohol Use  . Yes     No Known Allergies  Current Outpatient Prescriptions  Medication Sig Dispense  Refill  . aspirin EC 81 MG tablet Take 81 mg by mouth every evening.     . Cyanocobalamin (VITAMIN B-12 PO) Take 1 tablet by mouth every evening.     Marland Kitchen FeFum-FePoly-FA-B Cmp-C-Biot (FOLIVANE-PLUS PO) Take 1 tablet by mouth 2 (two) times daily.     Marland Kitchen lisinopril (PRINIVIL,ZESTRIL) 40 MG tablet Take 40 mg by mouth every evening.     . lovastatin (MEVACOR) 40 MG tablet Take 40 mg by mouth 2 (two) times a week. Mon and Tues    . metFORMIN (GLUCOPHAGE) 500 MG tablet Take 500 mg by mouth 2 (two) times daily with a meal.     . Multiple Vitamin (MULTIVITAMIN WITH MINERALS) TABS tablet Take 1 tablet by mouth every evening.     . Multiple Vitamins-Minerals (VISION VITAMINS PO) Take 1 tablet by mouth every evening.     . psyllium (METAMUCIL) 58.6 % powder Take 1 packet by mouth daily.     No current facility-administered medications for this visit.       Review of Systems:     Cardiac Review of Systems: Y or N  Chest Pain [ n   ]  Resting SOB [ n  ] Exertional SOB  [ y ]  Orthopnea Blue.Reese  ]   Pedal Edema [ mild  ]    Palpitations Florencio.Farrier  ] Syncope  [ n ]   Presyncope [   ]  General Review of Systems: [Y] = yes [  ]=no Constitional: recent weight change [n  ];  Wt loss over the last  3 months [   ] anorexia [  ]; fatigue [  ]; nausea [  ]; night sweats [  ]; fever [  ]; or chills [  ];          Dental: poor dentition[  ]; Last Dentist visit:   Eye : blurred vision [  ]; diplopia [   ]; vision changes [  ];  Amaurosis fugax[  ]; Resp: cough [  ];  wheezing[  ];  hemoptysis[  ]; shortness of breath[ y ]; paroxysmal nocturnal dyspnea[  ]; dyspnea on exertion[ y ]; or orthopnea[  ];  GI:  gallstones[  ], vomiting[  ];  dysphagia[  ]; melena[ y ];  hematochezia [  ]; heartburn[  ];   Hx of  Colonoscopy[ y ]; GU: kidney stones [  ]; hematuria[  ];   dysuria [  ];  nocturia[  ];  history of     obstruction [  ]; urinary frequency [  ]             Skin: rash, swelling[  ];, hair loss[  ];  peripheral edema[  ];  or itching[  ]; Musculosketetal: myalgias[  ];  joint swelling[  ];  joint erythema[  ];  joint pain[  ];  back pain[  ];  Heme/Lymph: bruising[  ];  bleeding[  ];  anemia[  ];  Neuro: TIA[  ];  headaches[  ];  stroke[  ];  vertigo[  ];  seizures[  ];   paresthesias[  ];  difficulty walking[  ];  Psych:depression[  ]; anxiety[  ];  Endocrine: diabetes[  ];  thyroid dysfunction[  ];  Immunizations: Flu up to date [ y ]; Pneumococcal up to date [ y ];  Other:  Physical Exam: BP 127/66 (BP Location: Right Arm, Patient Position: Sitting, Cuff Size: Large)   Pulse 67   Resp 16  Ht 5\' 10"  (1.778 m)   Wt 200 lb (90.7 kg)   SpO2 99% Comment: ON RA  BMI 28.70 kg/m   PHYSICAL EXAMINATION: General appearance: alert, cooperative, appears stated age and no distress Head: Normocephalic, without obvious abnormality, atraumatic Neck: no adenopathy, no carotid bruit, no JVD, supple, symmetrical, trachea midline and thyroid not enlarged, symmetric, no tenderness/mass/nodules Lymph nodes: Cervical, supraclavicular, and axillary nodes normal. Resp: clear to auscultation bilaterally Back: symmetric, no curvature. ROM normal. No CVA tenderness. Cardio: regular rate and rhythm, S1, S2  normal,  3/6 holosystolic murmur over the left lower sternal border consistent with aortic stenosis, click, rub or gallop GI: soft, non-tender; bowel sounds normal; no masses,  no organomegaly Extremities: extremities normal, atraumatic, no cyanosis or edema and Homans sign is negative, no sign of DVT Neurologic: Grossly normal  Diagnostic Studies & Laboratory data:     Recent Radiology Findings:   Ct Coronary Morph W/cta Cor W/score W/ca W/cm &/or Wo/cm  Addendum Date: 08/25/2016   ADDENDUM REPORT: 08/25/2016 18:18 CLINICAL DATA:  83 year old male with severe aortic stenosis. EXAM: Cardiac TAVR CT TECHNIQUE: The patient was scanned on a Philips 256 scanner. A 120 kV retrospective scan was triggered in the descending thoracic aorta at 111 HU's. Gantry rotation speed was 270 msecs and collimation was .9 mm. No beta blockade or nitro were given. The 3D data set was reconstructed in 5% intervals of the R-R cycle. Systolic and diastolic phases were analyzed on a dedicated work station using MPR, MIP and VRT modes. The patient received 80 cc of contrast. FINDINGS: Aortic Valve: Functionally bicuspid aortic valve with non-separated left and right coronary cusp. There is severe calcification and thickening of the leaflets with almost no calcium extending into the LVOT. Aorta: Mild dilatation of the sinotubular junction and upper normal size of the ascending aorta. Mild diffuse calcifications of the aortic arch and descending aorta. Sinotubular Junction:  35 x 34 mm Ascending Thoracic Aorta:  40 x 40 mm Aortic Arch:  31 x 30 mm Descending Thoracic Aorta:  30 x 29 mm Sinus of Valsalva Measurements: Non-coronary:  39 mm Right -coronary:  38 mm Left -coronary:  39 mm Coronary Artery Height above Annulus: Left Main:  14 mm Right Coronary:  13 mm Virtual Basal Annulus Measurements: Maximum/Minimum Diameter:  32 x 26 mm Perimeter:  94 mm Area:  666 mm2 Coronary Arteries:  Normal origin.  Right dominance. Optimum  Fluoroscopic Angle for Delivery:  RAO 2 CRA 1 IMPRESSION: 1. Functionally bicuspid aortic valve with non-separated left and right coronary cusp. There is severe calcification and thickening of the leaflets with almost no calcium extending into the LVOT. Annular measurements are at upper limit for the 29 mm Edwards-SAPIEN 3 valve. 2. Mild dilatation of the sinotubular junction and upper normal size of the ascending aorta measuring 40 mm. 3.  Sufficient annulus to coronary distance. 4. Optimum Fluoroscopic Angle for Delivery:  RAO 2 CRA 1 Ena Dawley Electronically Signed   By: Ena Dawley   On: 08/25/2016 18:18   Result Date: 08/25/2016 EXAM: OVER-READ INTERPRETATION  CT CHEST The following report is an over-read performed by radiologist Dr. Rebekah Chesterfield [facility] on 08/25/2016. This over-read does not include interpretation of cardiac or coronary anatomy or pathology. The coronary CTA interpretation by the cardiologist is attached. COMPARISON:  None. FINDINGS: Extracardiac findings will be described on separate dictation for contemporaneously obtained CTA of the chest, abdomen and pelvis. IMPRESSION: Please see separate dictation for contemporaneously  obtained CTA of the chest, abdomen and pelvis dated 08/25/2016 for full description of relevant extracardiac findings. Electronically Signed: By: Vinnie Langton M.D. On: 08/25/2016 11:36   Ct Angio Chest Aorta W/cm &/or Wo/cm  Result Date: 08/25/2016 CLINICAL DATA:  83 year old male with history of severe aortic stenosis. Preprocedural study prior to potential transcatheter aortic valve replacement (TAVR) procedure. EXAM: CT ANGIOGRAPHY CHEST, ABDOMEN AND PELVIS TECHNIQUE: Multidetector CT imaging through the chest, abdomen and pelvis was performed using the standard protocol during bolus administration of intravenous contrast. Multiplanar reconstructed images and MIPs were obtained and reviewed to evaluate the vascular anatomy.  CONTRAST:  67 mL of Isovue 370. COMPARISON:  CT the abdomen and pelvis 08/24/2009. FINDINGS: CTA CHEST FINDINGS Cardiovascular: Heart size is normal. There is no significant pericardial fluid, thickening or pericardial calcification. There is aortic atherosclerosis, as well as atherosclerosis of the great vessels of the mediastinum and the coronary arteries, including calcified atherosclerotic plaque in the left main, left anterior descending, left circumflex and right coronary arteries. Severe thickening calcifications of the aortic valve. Mediastinum/Lymph Nodes: No pathologically enlarged mediastinal or hilar lymph nodes. Esophagus is unremarkable in appearance. No axillary lymphadenopathy. Lungs/Pleura: There are several scattered 2-3 mm pulmonary nodules noted throughout the lungs bilaterally, highly nonspecific, but statistically likely benign. No other larger more suspicious appearing pulmonary nodules or masses are noted. No acute consolidative airspace disease. No pleural effusions. Musculoskeletal/Soft Tissues: There are no aggressive appearing lytic or blastic lesions noted in the visualized portions of the skeleton. CTA ABDOMEN AND PELVIS FINDINGS Hepatobiliary: No suspicious cystic or solid hepatic lesions. No intra or extrahepatic biliary ductal dilatation. Small calcified gallstone measuring 5 mm lying dependently in the gallbladder. No findings to suggest an acute cholecystitis at this time. Pancreas: No pancreatic mass. No pancreatic ductal dilatation. No pancreatic or peripancreatic fluid or inflammatory changes. Spleen: Unremarkable. Adrenals/Urinary Tract: Subcentimeter low-attenuation lesions in the lower poles of both kidneys are too small to definitively characterize, but are statistically likely tiny cysts. No suspicious renal lesions. No hydroureteronephrosis. Bilateral adrenal glands are normal in appearance. Urinary bladder is thickened and heavily trabeculated with multiple small bladder  diverticulae. Stomach/Bowel: The appearance of the stomach is normal. There is no pathologic dilatation of small bowel or colon. Normal appendix. Vascular/Lymphatic: Aortic atherosclerosis, with vascular findings and measurements pertinent to potential TAVR procedure, as detailed below. No aneurysm or dissection noted in the abdominal or pelvic vasculature. Celiac axis, superior mesenteric artery and inferior mesenteric artery are all widely patent without definite hemodynamically significant stenosis. Two left-sided and 1 right-sided renal arteries are all widely patent. No lymphadenopathy noted in the abdomen or pelvis. Reproductive: Prostate gland is markedly enlarged with median lobe hypertrophy, measuring 7.4 x 6.6 x 7.8 cm. Seminal vesicles are unremarkable in appearance. Other: No significant volume of ascites.  No pneumoperitoneum. Musculoskeletal: There are no aggressive appearing lytic or blastic lesions noted in the visualized portions of the skeleton. VASCULAR MEASUREMENTS PERTINENT TO TAVR: AORTA: Minimal Aortic Diameter -  17 x 17 mm Severity of Aortic Calcification -  moderate to severe RIGHT PELVIS: Right Common Iliac Artery - Minimal Diameter - 10.6 x 8.4 mm Tortuosity - moderate Calcification - mild to moderate Right External Iliac Artery - Minimal Diameter - 9.1 x 7.2 mm Tortuosity - moderate to severe Calcification - mild Right Common Femoral Artery - Minimal Diameter - 8.5 x 6.6  mm Tortuosity - mild Calcification - mild LEFT PELVIS: Left Common Iliac Artery - Minimal Diameter - 7.4 x  6.8 mm Tortuosity - mild Calcification - mild to moderate Left External Iliac Artery - Minimal Diameter - 8.3 x 8.3 mm Tortuosity - moderate to severe Calcification - none Left Common Femoral Artery - Minimal Diameter - 9.5 x 9.4 mm Tortuosity - mild Calcification - minimal Review of the MIP images confirms the above findings. IMPRESSION: 1. Vascular findings and measurements pertinent to potential TAVR procedure,  as detailed above. This patient appears to have suitable pelvic arterial access bilaterally. 2. Severe thickening and calcification of the aortic valve, compatible with the reported clinical history of severe aortic stenosis. 3. Aortic atherosclerosis, in addition to left main and 3 vessel coronary artery disease. 4. Severely thickened and heavily trabeculated urinary bladder with multiple small bladder wall diverticulae, presumably from chronic bladder outlet obstruction related to the patient's enlarged prostate gland. 5. Multiple tiny 2-3 mm pulmonary nodules scattered throughout the lungs bilaterally, highly nonspecific but statistically likely benign. No follow-up needed if patient is low-risk (and has no known or suspected primary neoplasm). Non-contrast chest CT can be considered in 12 months if patient is high-risk. This recommendation follows the consensus statement: Guidelines for Management of Incidental Pulmonary Nodules Detected on CT Images: From the Fleischner Society 2017; Radiology 2017; 284:228-243. 6. Additional incidental findings, as above. Electronically Signed   By: Vinnie Langton M.D.   On: 08/25/2016 12:38     I have independently reviewed the above radiologic studies.  Recent Lab Findings: Lab Results  Component Value Date   WBC 17.6 (H) 12/11/2015   HGB 12.4 (L) 12/11/2015   HCT 37.9 (L) 12/11/2015   PLT 246 12/11/2015   GLUCOSE 180 (H) 08/25/2016   ALT 21 12/11/2015   AST 21 12/11/2015   NA 139 08/25/2016   K 4.0 08/25/2016   CL 107 08/25/2016   CREATININE 1.14 08/25/2016   BUN 16 08/25/2016   CO2 25 08/25/2016   Diagnostic Tests:  *[facility]* [place], [place] [address] [phone]  ------------------------------------------------------------------- Transesophageal Echocardiography  Patient: Young, Gaynor MR #: [id number] Study Date: 07/29/2016 Gender: M Age: 68 Height: 177.8 cm Weight: 93 kg BSA: 2.17 m^2 Pt. Status: Room:  ATTENDING Adrian Prows, MD PERFORMING Adrian Prows, MD SONOGRAPHER Florentina Jenny, RDCS ORDERING Kela Millin R  cc:  ------------------------------------------------------------------- LV EF: 55% - 60%  ------------------------------------------------------------------- Indications: Aortic stenosis 424.1.  ------------------------------------------------------------------- History: Risk factors: Hypertension.  ------------------------------------------------------------------- Study Conclusions  - Left ventricle: There was mild concentric hypertrophy. Systolic function was normal. The estimated ejection fraction was in the range of 55% to 60%. - Aortic valve: Mildly calcified annulus. Trileaflet; severely calcified leaflets. There was moderate to severe stenosis. Mean gradient (S): 49 mm Hg. Peak gradient (S): 71 mm Hg. Valve area (VTI): 1.23 cm^2. Valve area (Vmax): 1.4 cm^2. Valve area (Vmean): 1.24 cm^2. LVOT measures 2.5 cm. - Aorta: There was mild atheromatous plaque. There was a localized ectatic segment, in the proximal ascending aorta. and measures 3.9 cm. Sino tubular junctino is normal in size. - Mitral valve: No evidence of vegetation. There was mild regurgitation. - Left atrium: The atrium was dilated. - Right atrium: No evidence of thrombus in the atrial cavity or appendage. - Atrial septum: No defect or patent foramen ovale was identified by color Doppler exam.  ------------------------------------------------------------------- Study data: Study status: Routine. Consent: The risks, benefits, and alternatives to the procedure were explained to the patient and informed consent was obtained. Procedure: The patient reported no pain pre or  post  test. Initial setup. The patient was brought to the laboratory. Surface ECG leads were monitored. Sedation. Conscious sedation was administered by cardiology staff. Transesophageal echocardiography. An adult multiplane transesophageal probe was inserted by the attending cardiologistwithout difficulty. Image quality was adequate. Study completion: The patient tolerated the procedure well. There were no complications. Administered medications: Midazolam, 2mg , IV. Fentanyl, 58mcg, IV. Diagnostic transesophageal echocardiography. 2D and color Doppler. Birthdate: Patient birthdate: 1933-09-17. Age: Patient is 83 yr old. Sex: Gender: male. BMI: 29.4 kg/m^2. Blood pressure: 158/68 Patient status: Outpatient. Study date: Study date: 07/29/2016. Study time: 12:13 PM. Location: Endoscopy.  -------------------------------------------------------------------  ------------------------------------------------------------------- Left ventricle: There was mild concentric hypertrophy. Systolic function was normal. The estimated ejection fraction was in the range of 55% to 60%.  ------------------------------------------------------------------- Aortic valve: Mildly calcified annulus. Trileaflet; severely calcified leaflets. Doppler: There was moderate to severe stenosis. Doppler assessment for regurgitation was not performed. VTI ratio of LVOT to aortic valve: 0.25. Valve area (VTI): 1.23 cm^2. Indexed valve area (VTI): 0.57 cm^2/m^2. Peak velocity ratio of LVOT to aortic valve: 0.29. Valve area (Vmax): 1.4 cm^2. Indexed valve area (Vmax): 0.65 cm^2/m^2. Mean velocity ratio of LVOT to aortic valve: 0.25. Valve area (Vmean): 1.24 cm^2. Indexed valve area (Vmean): 0.57 cm^2/m^2. Mean gradient (S): 49 mm Hg. Peak gradient (S): 71 mm Hg.  ------------------------------------------------------------------- Aorta: There was mild atheromatous  plaque. There was a localized ectatic segment, in the proximal ascending aorta. and measures 3.9 cm. Sino tubular junctino is normal in size.  ------------------------------------------------------------------- Mitral valve: Structurally normal valve. Leaflet separation was normal. No evidence of vegetation. Doppler: There was mild regurgitation.  ------------------------------------------------------------------- Left atrium: The atrium was dilated. The appendage was morphologically a left appendage.  ------------------------------------------------------------------- Atrial septum: No defect or patent foramen ovale was identified by color Doppler exam.  ------------------------------------------------------------------- Right ventricle: The cavity size was normal. Wall thickness was normal. Systolic function was normal.  ------------------------------------------------------------------- Pulmonic valve: Structurally normal valve. Cusp separation was normal. Doppler: There was trivial regurgitation.  ------------------------------------------------------------------- Tricuspid valve: Structurally normal valve. Leaflet separation was normal. Doppler: There was trivial regurgitation.  ------------------------------------------------------------------- Right atrium: The atrium was normal in size. No evidence of thrombus in the atrial cavity or appendage.  ------------------------------------------------------------------- Pericardium: The pericardium was normal in appearance. There was no pericardial effusion.  ------------------------------------------------------------------- Measurements  Left ventricle Value Stroke volume, 2D 141 ml Stroke volume/bsa, 2D 65 ml/m^2  LVOT Value LVOT ID, S 25  mm LVOT area 4.91 cm^2 LVOT peak velocity, S 120 cm/s LVOT mean velocity, S 84.1 cm/s LVOT VTI, S 28.7 cm LVOT peak gradient, S 6 mm Hg  Aortic valve Value Aortic valve peak velocity, S 421 cm/s Aortic valve mean velocity, S 334 cm/s Aortic valve VTI, S 115 cm Aortic mean gradient, S 49 mm Hg Aortic peak gradient, S 71 mm Hg VTI ratio, LVOT/AV 0.25 Aortic valve area, VTI 1.23 cm^2 Aortic valve area/bsa, VTI 0.57 cm^2/m^2 Velocity ratio, peak, LVOT/AV 0.29 Aortic valve area, peak velocity 1.4 cm^2 Aortic valve area/bsa, peak velocity 0.65 cm^2/m^2 Velocity ratio, mean, LVOT/AV 0.25 Aortic valve area, mean velocity 1.24 cm^2 Aortic valve area/bsa, mean velocity 0.57 cm^2/m^2  Legend: (L) and (H) mark values outside specified reference range.  ------------------------------------------------------------------- Prepared and Electronically Authenticated by  Adrian Prows, MD 2017-12-19T18:38:12   [name]  Cardiac catheterization  Order# [id number]  Reading physician: Adrian Prows, MD Ordering physician: Adrian Prows, MD Study date: 08/12/16  Physicians   Panel Physicians Referring Physician Case Authorizing Physician  Adrian Prows, MD (Primary)  Procedures   Right Heart Cath and Coronary Angiography  Conclusion   1. LV gram not performed due to inability to cross the calcified aortic valve. 2. Heavy calcification of the aortic valve annulus and mitral valve annular calcification. 3. Mild coronary calcification without high-grade stenosis in the coronary arteries involving the left system. Mid RCA has a  smooth 60% stenosis, unchanged from 2007 angiography. 4. Smooth ascending, arch and descending thoracic aorta without aneurysm formation. 5. Right heart catheterization revealing mild pulmonary hypertension, preserved chronic output and cardiac index.  Recommendation: Patient will be evaluated for aortic valve replacement. He has an appointment with Dr. Ricki Miller tomorrow. A total of 80 mL contrast utilized.  Procedural Details/Technique   Technical Details Procedure performed:  Left heart catheterization including ascending aortogram, selective right and left coronary arteriography. Right heart catheterization and cannulation of cardiac output and cardiac index by Fick.  Indication: QUINTYN KASSIM is a 83 y.o. male with with history of hypertension, hyperlipidemia, Patient with known moderate coronary artery disease involving the RCA by: Angiography in 2007 with a 60% stenosis. At recent TEE has confirmed severe aortic stenosis by mean pressure gradient criteria of 49 mmHg, peak gradient 71 mmHg, but continuity aortic valve area was 1.23 cm. He has been complaining of new onset of shortness of breath and dyspnea on exertion. Hence now brought for right and left heart catheterization and for possible need for aortic valve replacement.  Right AC vein access for right heart and Right radial access for left heart catheterization was utilized for performing the procedure.   Technique:   A 5 French sheath introduced into right right AC vein for access under fluroscopic guidance. A 5 French Swan-Ganz catheter was advanced with balloon inflated under fluoroscopic guidance into first the right atrium followed by the right ventricle and into the pulmonary artery to pulmonary artery wedge position. Hemodynamics were obtained in a locations.  After hemodynamics were completed, samples were taken for SaO2% measurement to be used in Fick Calculation of cardiac output and cardiac index. The catheter was then pulled  back the balloon down and then completely out of the body. Hemostasis obtained by manual pressure over the access site.  Under sterile precautions using a 6 French right radial arterial access, a 6 French sheath was introduced into the right radial artery. A 5 [place] Tig 4 catheter was advanced into the ascending aorta selective right coronary artery and left coronary artery was cannulated and angiography was performed in multiple views. The catheter was pulled back Out of the body over exchange length J-wire.   I was unable to perform LV gram due to inability to cross the aortic valve due to heavy calcification and catheter bounce and shipping probably due to aortic stenosis.   A pigtail catheter was utilized to perform ascending aortogram. Catheter exchanged out of the body over J-Wire. NO immediate complications noted. Patient tolerated the procedure well.   Estimated blood loss <50 mL.  During this procedure the patient was administered the following to achieve and maintain moderate conscious sedation: Versed 2 mg, Dilaudid 0.5 mg, while the patient's heart rate, blood pressure, and oxygen saturation were continuously monitored. The period of conscious sedation was 35 minutes, of which I was present face-to-face 100% of this time.    Coronary Findings   Dominance: Right  Left Main  Ost LM to LM lesion, 5% stenosed. The lesion is calcified.  Right Coronary Artery  Prox RCA to Mid RCA lesion, 60% stenosed. The lesion  is smooth. No change from 2007  Right Heart   Right Heart Pressures RA 11/8, mean 6 mmHg RV 40/2, EDP 10 mmHg PA 37/13, mean 22 mmHg. PA saturation 63%. PW 16/11, mean 10 mmHg. Aortic saturation 95%. Cardiac output 5.81, cardiac index 2.78, normal.    Left Heart   Mitral Valve The annulus is calcified.    Aortic Valve The aortic valve is calcified.    Coronary Diagrams   Diagnostic Diagram     Implants        No implant documentation for this case.    PACS Images   Show images for Cardiac catheterization   Link to Procedure Log   Procedure Log    Hemo Data   Flowsheet Row Most Recent Value  Fick Cardiac Output 5.81 L/min  Fick Cardiac Output Index 2.78 (L/min)/BSA  RA A Wave 11 mmHg  RA V Wave 8 mmHg  RA Mean 6 mmHg  RV Systolic Pressure 40 mmHg  RV Diastolic Pressure 2 mmHg  RV EDP 10 mmHg  PA Systolic Pressure 37 mmHg  PA Diastolic Pressure 13 mmHg  PA Mean 22 mmHg  PW A Wave 16 mmHg  PW V Wave 11 mmHg  PW Mean 10 mmHg  AO Systolic Pressure 123456 mmHg  AO Diastolic Pressure 68 mmHg  AO Mean 95 mmHg  QP/QS 1  TPVR Index 7.91 HRUI  TSVR Index 34.19 HRUI  PVR SVR Ratio 0.13  TPVR/TSVR Ratio 0.23     RISK SCORES About the STS Risk Calculator Procedure: AV Replacement + CAB  Risk of Mortality: 2.51%  Morbidity or Mortality: 18.142%  Long Length of Stay: 9.28%  Short Length of Stay: 29.072%  Permanent Stroke: 1.734%  Prolonged Ventilation: 10.188%  DSW Infection: 0.397%  Renal Failure: 6.182%  Reoperation: 7.746%     Assessment / Plan:   Functionally bicuspid aortic valve with severe stenosis,Aortic valve peak velocity, S 421 cm/s . The patient comes in today for second opinion concerning standard aortic valve replacement versus TVAR. With the patient's severe aortic stenosis symptomatic heart failure he needs more than just medical treatment. He has not a prohibitive risk for open surgical valve replacement but with his age he would be of moderate risk especially in in the speed of his recovery. His history of AVMs and cecal bleeding in the spring of 2017 made resent obstacles to anticoagulation and/or anti-platelet therapy. Dr. Caffie Pinto has explained to the patient in detail the risks and options of TVAR, and a discussion today he seems well versed in the advantages and disadvantages and wishes to proceed with TVAR, next week.    Multiple tiny 2-3 mm pulmonary nodules scattered throughout the lungs  bilaterally, highly nonspecific but statistically likely benign  I  spent 40 minutes counseling the patient face to face and 50% or more the  time was spent in counseling and coordination of care. The total time spent in the appointment was 60 minutes.  Grace Isaac MD      [address] [place] [address] Office [phone]   Beeper [phone]  08/28/2016 3:08 PM

## 2016-08-29 ENCOUNTER — Encounter (HOSPITAL_COMMUNITY): Payer: Self-pay

## 2016-08-29 ENCOUNTER — Encounter (HOSPITAL_COMMUNITY)
Admission: RE | Admit: 2016-08-29 | Discharge: 2016-08-29 | Disposition: A | Discharge status: Home / Self Care | Payer: Medicare Other | Source: Ambulatory Visit | Attending: Cardiovascular Disease | Admitting: Cardiovascular Disease

## 2016-08-29 DIAGNOSIS — I350 Nonrheumatic aortic (valve) stenosis: Secondary | ICD-10-CM | POA: Diagnosis not present

## 2016-08-29 DIAGNOSIS — I700 Atherosclerosis of aorta: Secondary | ICD-10-CM | POA: Diagnosis not present

## 2016-08-29 DIAGNOSIS — Z01810 Encounter for preprocedural cardiovascular examination: Secondary | ICD-10-CM | POA: Insufficient documentation

## 2016-08-29 DIAGNOSIS — Z01812 Encounter for preprocedural laboratory examination: Secondary | ICD-10-CM | POA: Insufficient documentation

## 2016-08-29 DIAGNOSIS — Z01818 Encounter for other preprocedural examination: Secondary | ICD-10-CM | POA: Diagnosis not present

## 2016-08-29 HISTORY — DX: Dyspnea, unspecified: R06.00

## 2016-08-29 HISTORY — DX: Supravalvular aortic stenosis: Q25.3

## 2016-08-29 HISTORY — DX: Coarctation of aorta: Q25.1

## 2016-08-29 HISTORY — DX: Type 2 diabetes mellitus without complications: E11.9

## 2016-08-29 LAB — BLOOD GAS, ARTERIAL
Acid-base deficit: 2.6 mmol/L — ABNORMAL HIGH (ref 0.0–2.0)
Bicarbonate: 21.2 mmol/L (ref 20.0–28.0)
Drawn by: 42180
FIO2: 0.21
O2 Saturation: 96.5 %
PATIENT TEMPERATURE: 98.6
PO2 ART: 98.9 mmHg (ref 83.0–108.0)
pCO2 arterial: 33.6 mmHg (ref 32.0–48.0)
pH, Arterial: 7.416 (ref 7.350–7.450)

## 2016-08-29 LAB — COMPREHENSIVE METABOLIC PANEL
ALBUMIN: 3.8 g/dL (ref 3.5–5.0)
ALK PHOS: 56 U/L (ref 38–126)
ALT: 16 U/L — AB (ref 17–63)
AST: 22 U/L (ref 15–41)
Anion gap: 10 (ref 5–15)
BILIRUBIN TOTAL: 1.4 mg/dL — AB (ref 0.3–1.2)
BUN: 16 mg/dL (ref 6–20)
CALCIUM: 9.2 mg/dL (ref 8.9–10.3)
CO2: 20 mmol/L — ABNORMAL LOW (ref 22–32)
CREATININE: 1.21 mg/dL (ref 0.61–1.24)
Chloride: 107 mmol/L (ref 101–111)
GFR calc Af Amer: >60 mL/min (ref >60)
GFR calc non Af Amer: 55 mL/min — ABNORMAL LOW (ref >60)
GLUCOSE: 260 mg/dL — AB (ref 65–99)
Potassium: 4.2 mmol/L (ref 3.5–5.1)
Sodium: 137 mmol/L (ref 135–145)
Total Protein: 6 g/dL — ABNORMAL LOW (ref 6.5–8.1)

## 2016-08-29 LAB — URINALYSIS, ROUTINE W REFLEX MICROSCOPIC
BILIRUBIN URINE: NEGATIVE
Glucose, UA: 150 mg/dL — AB
HGB URINE DIPSTICK: NEGATIVE
Ketones, ur: NEGATIVE mg/dL
Leukocytes, UA: NEGATIVE
Nitrite: NEGATIVE
PH: 5 (ref 5.0–8.0)
Protein, ur: NEGATIVE mg/dL
SPECIFIC GRAVITY, URINE: 1.013 (ref 1.005–1.030)

## 2016-08-29 LAB — CBC
HEMATOCRIT: 26 % — AB (ref 39.0–52.0)
HEMOGLOBIN: 8.5 g/dL — AB (ref 13.0–17.0)
MCH: 31.4 pg (ref 26.0–34.0)
MCHC: 32.7 g/dL (ref 30.0–36.0)
MCV: 95.9 fL (ref 78.0–100.0)
Platelets: 158 10*3/uL (ref 150–400)
RBC: 2.71 MIL/uL — AB (ref 4.22–5.81)
RDW: 16.4 % — ABNORMAL HIGH (ref 11.5–15.5)
WBC: 5.1 10*3/uL (ref 4.0–10.5)

## 2016-08-29 LAB — SURGICAL PCR SCREEN
MRSA, PCR: NEGATIVE
Staphylococcus aureus: NEGATIVE

## 2016-08-29 LAB — GLUCOSE, CAPILLARY: GLUCOSE-CAPILLARY: 293 mg/dL — AB (ref 65–99)

## 2016-08-29 LAB — PROTIME-INR
INR: 1.09
Prothrombin Time: 14.2 seconds (ref 11.4–15.2)

## 2016-08-29 LAB — APTT: aPTT: 32 seconds (ref 24–36)

## 2016-08-29 MED ORDER — SODIUM CHLORIDE 0.9 % IV SOLN: INTRAVENOUS | Status: DC

## 2016-08-29 MED ORDER — CHLORHEXIDINE GLUCONATE 4 % EX LIQD: 60.0000 mL | Freq: Once | CUTANEOUS | Status: DC

## 2016-08-29 MED ORDER — CHLORHEXIDINE GLUCONATE 0.12 % MT SOLN: 15.0000 mL | Freq: Once | OROMUCOSAL | Status: DC

## 2016-09-01 ENCOUNTER — Inpatient Hospital Stay (HOSPITAL_COMMUNITY): Payer: Medicare Other | Admitting: Anesthesiology

## 2016-09-01 MED ORDER — DEXTROSE 5 % IV SOLN
1.5000 g | INTRAVENOUS | Status: DC
  Filled 2016-09-01 (×2): qty 1.5

## 2016-09-01 MED ORDER — POTASSIUM CHLORIDE 2 MEQ/ML IV SOLN
80.0000 meq | INTRAVENOUS | Status: DC
  Filled 2016-09-01: qty 40

## 2016-09-01 MED ORDER — MAGNESIUM SULFATE 50 % IJ SOLN
40.0000 meq | INTRAMUSCULAR | Status: DC
  Filled 2016-09-01: qty 10

## 2016-09-01 MED ORDER — NITROGLYCERIN IN D5W 200-5 MCG/ML-% IV SOLN
2.0000 ug/min | INTRAVENOUS | Status: DC
  Filled 2016-09-01: qty 250

## 2016-09-01 MED ORDER — DEXMEDETOMIDINE HCL IN NACL 400 MCG/100ML IV SOLN
0.1000 ug/kg/h | INTRAVENOUS | Status: DC
  Filled 2016-09-01: qty 100

## 2016-09-01 MED ORDER — VANCOMYCIN HCL 10 G IV SOLR
1500.0000 mg | INTRAVENOUS | Status: DC
  Filled 2016-09-01: qty 1500

## 2016-09-01 MED ORDER — EPINEPHRINE PF 1 MG/ML IJ SOLN
0.0000 ug/min | INTRAVENOUS | Status: DC
  Filled 2016-09-01: qty 4

## 2016-09-01 MED ORDER — SODIUM CHLORIDE 0.9 % IV SOLN
30.0000 ug/min | INTRAVENOUS | Status: DC
  Filled 2016-09-01: qty 2

## 2016-09-01 MED ORDER — SODIUM CHLORIDE 0.9 % IV SOLN
INTRAVENOUS | Status: DC
  Filled 2016-09-01: qty 2.5

## 2016-09-01 MED ORDER — DOPAMINE-DEXTROSE 3.2-5 MG/ML-% IV SOLN
0.0000 ug/kg/min | INTRAVENOUS | Status: DC
  Filled 2016-09-01 (×2): qty 250

## 2016-09-01 MED ORDER — SODIUM CHLORIDE 0.9 % IV SOLN
INTRAVENOUS | Status: DC
  Filled 2016-09-01: qty 30

## 2016-09-01 MED ORDER — NOREPINEPHRINE BITARTRATE 1 MG/ML IV SOLN
0.0000 ug/min | INTRAVENOUS | Status: DC
  Filled 2016-09-01: qty 4

## 2016-09-01 NOTE — H&P (Signed)
Marvin Houston       Marvin Houston,Marvin Houston [address]             [phone]      Cardiothoracic Surgery Admission History and Physical   Referring Provider is Marvin Prows, MD PCP is Marvin Gravel, MD      Chief Complaint  Patient presents with  . Aortic Stenosis        HPI:  The patient is an 83 year old gentleman with hypertension, diabetes, hyperlipidemia, and moderate aortic stenosis followed by echo. In April 2017 he developed severe anemia with lower GI bleeding and colonoscopy found internal and external hemorrhoids as well as several large AVM's in the ascending colon and cecum that were not bleeding. UGI was negative and it was felt that the anemia was likely secondary to chronic disease and hemorrhoids. He did require transfusion. He reports that he has felt more fatigued over the past six months when he plays golf. He has shortness of breath with exertion. He denies any , orthopnea or PND. He denies chest pain and dizziness. He underwent a repeat echo on 06/11/2016 showing moderate concentric LVH with an EF of 58%. The aortic valve was reportedly moderately calcified and severe leaflet restriction and the mean AV gradient was 41 mm Hg with a peak of 68 mm Hg. The calculated AVA was 0.8 cm2. The mean gradient on echo in 11/2015 was 31 mm Hg. He subsequently underwent a TEE on 07/29/2016 showing a mean gradient of 49 mm Hg with a peak of 71 mm Hg and an EF of 55-60%. There was mild atheromatous plaque throughout the arch and proximal descending aorta. He underwent cath on 08/13/2015 showing 60% mid RCA stenosis that was unchanged from cath in 2007.   The patient is retired and lives at home with his wife. He stays active playing golf.      Past Medical History:  Diagnosis Date  . Hypertension          Past Surgical History:  Procedure Laterality Date  . TEE WITHOUT CARDIOVERSION N/A 07/29/2016   Procedure: TRANSESOPHAGEAL ECHOCARDIOGRAM (TEE);  Surgeon: Marvin Prows, MD;  Location: [facility] ENDOSCOPY;  Service: Cardiovascular;  Laterality: N/A;    No family history on file.  Social History   Social History  . Marital status: Single    Spouse name: N/A  . Number of children: N/A  . Years of education: N/A      Occupational History  . Not on file.       Social History Main Topics  . Smoking status: Never Smoker  . Smokeless tobacco: Never Used  . Alcohol use Yes  . Drug use: No  . Sexual activity: Not on file       Other Topics Concern  . Not on file      Social History Narrative  . No narrative on file          Current Outpatient Prescriptions  Medication Sig Dispense Refill  . aspirin EC 81 MG tablet Take 81 mg by mouth every evening.     . Cyanocobalamin (VITAMIN B-12 PO) Take 1 tablet by mouth daily.    Marland Kitchen FeFum-FePoly-FA-B Cmp-C-Biot (FOLIVANE-PLUS PO) Take 1 tablet by mouth 2 (two) times daily.     Marland Kitchen lisinopril (PRINIVIL,ZESTRIL) 40 MG tablet Take 40 mg by mouth daily.    Marland Kitchen lovastatin (MEVACOR) 40 MG tablet Take 40 mg by mouth 2 (two) times a week. Mon and  Tues    . metFORMIN (GLUCOPHAGE) 500 MG tablet Take 500 mg by mouth 2 (two) times daily with a meal.     . Multiple Vitamin (MULTIVITAMIN WITH MINERALS) TABS tablet Take 1 tablet by mouth daily.    . Multiple Vitamins-Minerals (VISION VITAMINS PO) Take 1 tablet by mouth daily.    . sildenafil (VIAGRA) 100 MG tablet Take 100 mg by mouth daily as needed for erectile dysfunction.     No current facility-administered medications for this visit.     No Known Allergies    Review of Systems:              General:                      normal appetite, decreased energy, no weight gain, no weight loss, no fever             Cardiac:                       no chest pain with exertion, no chest pain at rest, moderate SOB with exertion, no resting SOB, no PND, no orthopnea, no palpitations, no arrhythmia, no atrial fibrillation, no LE edema, no  dizzy spells, no syncope             Respiratory:                 exertional shortness of breath, no home oxygen, no productive cough, no dry cough, no bronchitis, no wheezing, no hemoptysis, no asthma, no pain with inspiration or cough, no sleep apnea, no CPAP at night             GI:                               no difficulty swallowing, no reflux, no frequent heartburn, no hiatal hernia, no abdominal pain, no constipation, no diarrhea, no hematochezia, no hematemesis, no melena, Has BRBPR.             GU:                              no dysuria,  no frequency, hx of urinary tract infection, no hematuria, no enlarged prostate, no kidney stones, no kidney disease             Vascular:                     no pain suggestive of claudication, no pain in feet, nighttime leg cramps, no varicose veins, no DVT, no non-healing foot ulcer             Neuro:                         no stroke, no TIA's, no seizures, no headaches, no temporary blindness one eye,  no slurred speech, no peripheral neuropathy, no chronic pain, no instability of gait, no memory/cognitive dysfunction             Musculoskeletal:         no arthritis, no joint swelling, no myalgias, no difficulty walking, no mobility              Skin:  no rash, no itching, no skin infections, no pressure sores or ulcerations             Psych:                         no anxiety, no depression, no nervousness, no unusual recent stress             Eyes:                           no blurry vision, no floaters, has recent vision changes,  wears glasses              ENT:                            no hearing loss, no loose or painful teeth, no dentures, last saw dentist 07/2016             Hematologic:               has easy bruising, no abnormal bleeding, no clotting disorder, no frequent epistaxis             Endocrine:                   has diabetes, does  check CBG's at home                             Physical  Exam:              BP (!) 148/74 (BP Location: Left Arm, Patient Position: Sitting, Cuff Size: Large)   Pulse (!) 55   Resp 16   Ht 5\' 10"  (1.778 m)   Wt 200 lb (90.7 kg)   SpO2 99% Comment: ON RA  BMI 28.70 kg/m              General:                      Elderly but  well-appearing             HEENT:                       Unremarkable, NCAT, PERLA, EOMI, oropharynx clear             Neck:                           no JVD, no bruits, no adenopathy or thyromegaly             Chest:                          clear to auscultation, symmetrical breath sounds, no wheezes, no rhonchi              CV:                              RRR, grade III/VI crescendo/decrescendo murmur heard best at RSB,  no diastolic murmur             Abdomen:                    soft, non-tender, no  masses or organomegaly             Extremities:                 warm, well-perfused, pulses palpable in feet, no LE edema             Rectal/GU                   Deferred             Neuro:                         Grossly non-focal and symmetrical throughout             Skin:                            Clean and dry, no rashes, no breakdown   Diagnostic Tests:  *[facility]* *[facility]* [address], [place] [address] [phone]  ------------------------------------------------------------------- Transesophageal Echocardiography  Patient: Marvin Houston, Marvin Houston MR #: [id number] Study Date: 07/29/2016 Gender: M Age: 25 Height: 177.8 cm Weight: 93 kg BSA: 2.17 m^2 Pt. Status: Room:  ATTENDING Marvin Prows, MD PERFORMING Marvin Prows, MD SONOGRAPHER Florentina Jenny, RDCS ORDERING Kela Millin R  cc:  ------------------------------------------------------------------- LV EF: 55% -  60%  ------------------------------------------------------------------- Indications: Aortic stenosis 424.1.  ------------------------------------------------------------------- History: Risk factors: Hypertension.  ------------------------------------------------------------------- Study Conclusions  - Left ventricle: There was mild concentric hypertrophy. Systolic function was normal. The estimated ejection fraction was in the range of 55% to 60%. - Aortic valve: Mildly calcified annulus. Trileaflet; severely calcified leaflets. There was moderate to severe stenosis. Mean gradient (S): 49 mm Hg. Peak gradient (S): 71 mm Hg. Valve area (VTI): 1.23 cm^2. Valve area (Vmax): 1.4 cm^2. Valve area (Vmean): 1.24 cm^2. LVOT measures 2.5 cm. - Aorta: There was mild atheromatous plaque. There was a localized ectatic segment, in the proximal ascending aorta. and measures 3.9 cm. Sino tubular junctino is normal in size. - Mitral valve: No evidence of vegetation. There was mild regurgitation. - Left atrium: The atrium was dilated. - Right atrium: No evidence of thrombus in the atrial cavity or appendage. - Atrial septum: No defect or patent foramen ovale was identified by color Doppler exam.  ------------------------------------------------------------------- Study data: Study status: Routine. Consent: The risks, benefits, and alternatives to the procedure were explained to the patient and informed consent was obtained. Procedure: The patient reported no pain pre or post test. Initial setup. The patient was brought to the laboratory. Surface ECG leads were monitored. Sedation. Conscious sedation was administered by cardiology staff. Transesophageal echocardiography. An adult multiplane transesophageal probe was inserted by the attending cardiologistwithout difficulty. Image quality was adequate. Study completion: The patient tolerated the  procedure well. There were no complications. Administered medications: Midazolam, 2mg , IV. Fentanyl, 52mcg, IV. Diagnostic transesophageal echocardiography. 2D and color Doppler. Birthdate: Patient birthdate: 1934/06/09. Age: Patient is 83 yr old. Sex: Gender: male. BMI: 29.4 kg/m^2. Blood pressure: 158/68 Patient status: Outpatient. Study date: Study date: 07/29/2016. Study time: 12:13 PM. Location: Endoscopy.  -------------------------------------------------------------------  ------------------------------------------------------------------- Left ventricle: There was mild concentric hypertrophy. Systolic function was normal. The estimated ejection fraction was in the range of 55% to 60%.  ------------------------------------------------------------------- Aortic valve: Mildly calcified annulus. Trileaflet; severely calcified leaflets. Doppler: There was moderate to severe stenosis. Doppler assessment for regurgitation was not performed. VTI ratio of LVOT to aortic valve: 0.25. Valve  area (VTI): 1.23 cm^2. Indexed valve area (VTI): 0.57 cm^2/m^2. Peak velocity ratio of LVOT to aortic valve: 0.29. Valve area (Vmax): 1.4 cm^2. Indexed valve area (Vmax): 0.65 cm^2/m^2. Mean velocity ratio of LVOT to aortic valve: 0.25. Valve area (Vmean): 1.24 cm^2. Indexed valve area (Vmean): 0.57 cm^2/m^2. Mean gradient (S): 49 mm Hg. Peak gradient (S): 71 mm Hg.  ------------------------------------------------------------------- Aorta: There was mild atheromatous plaque. There was a localized ectatic segment, in the proximal ascending aorta. and measures 3.9 cm. Sino tubular junctino is normal in size.  ------------------------------------------------------------------- Mitral valve: Structurally normal valve. Leaflet separation was normal. No evidence of vegetation. Doppler: There was  mild regurgitation.  ------------------------------------------------------------------- Left atrium: The atrium was dilated. The appendage was morphologically a left appendage.  ------------------------------------------------------------------- Atrial septum: No defect or patent foramen ovale was identified by color Doppler exam.  ------------------------------------------------------------------- Right ventricle: The cavity size was normal. Wall thickness was normal. Systolic function was normal.  ------------------------------------------------------------------- Pulmonic valve: Structurally normal valve. Cusp separation was normal. Doppler: There was trivial regurgitation.  ------------------------------------------------------------------- Tricuspid valve: Structurally normal valve. Leaflet separation was normal. Doppler: There was trivial regurgitation.  ------------------------------------------------------------------- Right atrium: The atrium was normal in size. No evidence of thrombus in the atrial cavity or appendage.  ------------------------------------------------------------------- Pericardium: The pericardium was normal in appearance. There was no pericardial effusion.  ------------------------------------------------------------------- Measurements  Left ventricle Value Stroke volume, 2D 141 ml Stroke volume/bsa, 2D 65 ml/m^2  LVOT Value LVOT ID, S 25 mm LVOT area 4.91 cm^2 LVOT peak velocity, S 120 cm/s LVOT mean velocity, S 84.1 cm/s LVOT VTI, S 28.7 cm LVOT peak gradient, S 6 mm Hg  Aortic valve Value Aortic valve  peak velocity, S 421 cm/s Aortic valve mean velocity, S 334 cm/s Aortic valve VTI, S 115 cm Aortic mean gradient, S 49 mm Hg Aortic peak gradient, S 71 mm Hg VTI ratio, LVOT/AV 0.25 Aortic valve area, VTI 1.23 cm^2 Aortic valve area/bsa, VTI 0.57 cm^2/m^2 Velocity ratio, peak, LVOT/AV 0.29 Aortic valve area, peak velocity 1.4 cm^2 Aortic valve area/bsa, peak velocity 0.65 cm^2/m^2 Velocity ratio, mean, LVOT/AV 0.25 Aortic valve area, mean velocity 1.24 cm^2 Aortic valve area/bsa, mean velocity 0.57 cm^2/m^2  Legend: (L) and (H) mark values outside specified reference range.  ------------------------------------------------------------------- Prepared and Electronically Authenticated by  Marvin Prows, MD 2017-12-19T18:38:12   [name]  Cardiac catheterization  Order# [id number]  Reading physician: Marvin Prows, MD Ordering physician: Marvin Prows, MD Study date: 08/12/16  Physicians   Panel Physicians Referring Physician Case Authorizing Physician  Marvin Prows, MD (Primary)    Procedures   Right Heart Cath and Coronary Angiography  Conclusion   1. LV gram not performed due to inability to cross the calcified aortic valve. 2. Heavy calcification of the aortic valve annulus and mitral valve annular calcification. 3. Mild coronary calcification without high-grade stenosis in the coronary arteries involving the left system. Mid RCA has a smooth 60% stenosis, unchanged from 2007 angiography. 4. Smooth ascending, arch and descending thoracic aorta without aneurysm formation. 5. Right heart catheterization revealing mild pulmonary hypertension, preserved chronic output and cardiac index.  Recommendation: Patient will be evaluated for aortic valve replacement. He has an  appointment with Dr. Ricki Miller tomorrow. A total of 80 mL contrast utilized.  Procedural Details/Technique   Technical Details Procedure performed:  Left heart catheterization including ascending aortogram, selective right and left coronary arteriography. Right heart catheterization and cannulation of cardiac output and cardiac index by Fick.  Indication: Marvin Houston is a 83  y.o. male with with history of hypertension, hyperlipidemia, Patient with known moderate coronary artery disease involving the RCA by: Angiography in 2007 with a 60% stenosis. At recent TEE has confirmed severe aortic stenosis by mean pressure gradient criteria of 49 mmHg, peak gradient 71 mmHg, but continuity aortic valve area was 1.23 cm. He has been complaining of new onset of shortness of breath and dyspnea on exertion. Hence now brought for right and left heart catheterization and for possible need for aortic valve replacement.  Right AC vein access for right heart and Right radial access for left heart catheterization was utilized for performing the procedure.   Technique:   A 5 French sheath introduced into right right AC vein for access under fluroscopic guidance. A 5 French Swan-Ganz catheter was advanced with balloon inflated under fluoroscopic guidance into first the right atrium followed by the right ventricle and into the pulmonary artery to pulmonary artery wedge position. Hemodynamics were obtained in a locations.  After hemodynamics were completed, samples were taken for SaO2% measurement to be used in Fick Calculation of cardiac output and cardiac index. The catheter was then pulled back the balloon down and then completely out of the body. Hemostasis obtained by manual pressure over the access site.  Under sterile precautions using a 6 French right radial arterial access, a 6 French sheath was introduced into the right radial artery. A 5 [place] Tig 4 catheter was advanced into the ascending aorta selective right  coronary artery and left coronary artery was cannulated and angiography was performed in multiple views. The catheter was pulled back Out of the body over exchange length J-wire.   I was unable to perform LV gram due to inability to cross the aortic valve due to heavy calcification and catheter bounce and shipping probably due to aortic stenosis.   A pigtail catheter was utilized to perform ascending aortogram. Catheter exchanged out of the body over J-Wire. NO immediate complications noted. Patient tolerated the procedure well.   Estimated blood loss <50 mL.  During this procedure the patient was administered the following to achieve and maintain moderate conscious sedation: Versed 2 mg, Dilaudid 0.5 mg, while the patient's heart rate, blood pressure, and oxygen saturation were continuously monitored. The period of conscious sedation was 35 minutes, of which I was present face-to-face 100% of this time.    Coronary Findings   Dominance: Right  Left Main  Ost LM to LM lesion, 5% stenosed. The lesion is calcified.  Right Coronary Artery  Prox RCA to Mid RCA lesion, 60% stenosed. The lesion is smooth. No change from 2007  Right Heart   Right Heart Pressures RA 11/8, mean 6 mmHg RV 40/2, EDP 10 mmHg PA 37/13, mean 22 mmHg. PA saturation 63%. PW 16/11, mean 10 mmHg. Aortic saturation 95%. Cardiac output 5.81, cardiac index 2.78, normal.    Left Heart   Mitral Valve The annulus is calcified.    Aortic Valve The aortic valve is calcified.    Coronary Diagrams   Diagnostic Diagram     Implants        No implant documentation for this case.  PACS Images   Show images for Cardiac catheterization   Link to Procedure Log   Procedure Log    Hemo Data   Flowsheet Row Most Recent Value  Fick Cardiac Output 5.81 L/min  Fick Cardiac Output Index 2.78 (L/min)/BSA  RA A Wave 11 mmHg  RA V Wave 8 mmHg  RA Mean 6 mmHg  RV Systolic Pressure 40 mmHg  RV Diastolic Pressure  2 mmHg  RV EDP 10 mmHg  PA Systolic Pressure 37 mmHg  PA Diastolic Pressure 13 mmHg  PA Mean 22 mmHg  PW A Wave 16 mmHg  PW V Wave 11 mmHg  PW Mean 10 mmHg  AO Systolic Pressure 123456 mmHg  AO Diastolic Pressure 68 mmHg  AO Mean 95 mmHg  QP/QS 1  TPVR Index 7.91 HRUI  TSVR Index 34.19 HRUI  PVR SVR Ratio 0.13  TPVR/TSVR Ratio 0.23   ADDENDUM REPORT: 08/25/2016 18:18  CLINICAL DATA:  83 year old male with severe aortic stenosis.  EXAM: Cardiac TAVR CT  TECHNIQUE: The patient was scanned on a Philips 256 scanner. A 120 kV retrospective scan was triggered in the descending thoracic aorta at 111 HU's. Gantry rotation speed was 270 msecs and collimation was .9 mm. No beta blockade or nitro were given. The 3D data set was reconstructed in 5% intervals of the R-R cycle. Systolic and diastolic phases were analyzed on a dedicated work station using MPR, MIP and VRT modes. The patient received 80 cc of contrast.  FINDINGS: Aortic Valve: Functionally bicuspid aortic valve with non-separated left and right coronary cusp. There is severe calcification and thickening of the leaflets with almost no calcium extending into the LVOT.  Aorta: Mild dilatation of the sinotubular junction and upper normal size of the ascending aorta. Mild diffuse calcifications of the aortic arch and descending aorta.  Sinotubular Junction:  35 x 34 mm  Ascending Thoracic Aorta:  40 x 40 mm  Aortic Arch:  31 x 30 mm  Descending Thoracic Aorta:  30 x 29 mm  Sinus of Valsalva Measurements:  Non-coronary:  39 mm  Right -coronary:  38 mm  Left -coronary:  39 mm  Coronary Artery Height above Annulus:  Left Main:  14 mm  Right Coronary:  13 mm  Virtual Basal Annulus Measurements:  Maximum/Minimum Diameter:  32 x 26 mm  Perimeter:  94 mm  Area:  666 mm2  Coronary Arteries:  Normal origin.  Right dominance.  Optimum Fluoroscopic Angle for Delivery:  RAO 2 CRA  1  IMPRESSION: 1. Functionally bicuspid aortic valve with non-separated left and right coronary cusp. There is severe calcification and thickening of the leaflets with almost no calcium extending into the LVOT.  Annular measurements are at upper limit for the 29 mm Edwards-SAPIEN 3 valve.  2. Mild dilatation of the sinotubular junction and upper normal size of the ascending aorta measuring 40 mm.  3.  Sufficient annulus to coronary distance.  4. Optimum Fluoroscopic Angle for Delivery:  RAO 2 CRA 1  Ena Dawley   Electronically Signed   By: Ena Dawley   On: 08/25/2016 18:18   CT ANGIO CHEST AORTA W/CM &/OR WO/CM (Accession [id number]) (Order [id number])  Imaging  Date: 08/25/2016 Department: [facility] CT IMAGING Released By: Selmer Dominion Authorizing: Gaye Pollack, MD  Exam Information   Status Exam Begun  Exam Ended   Final [99] 08/25/2016 10:47 AM 08/25/2016 11:33 AM  PACS Images   Show images for CT ANGIO CHEST AORTA W/CM &/OR WO/CM  Study Result   CLINICAL DATA:  83 year old male with history of severe aortic stenosis. Preprocedural study prior to potential transcatheter aortic valve replacement (TAVR) procedure.  EXAM: CT ANGIOGRAPHY CHEST, ABDOMEN AND PELVIS  TECHNIQUE: Multidetector CT imaging through the chest, abdomen and pelvis was performed using the standard protocol during bolus administration of intravenous contrast. Multiplanar  reconstructed images and MIPs were obtained and reviewed to evaluate the vascular anatomy.  CONTRAST:  67 mL of Isovue 370.  COMPARISON:  CT the abdomen and pelvis 08/24/2009.  FINDINGS: CTA CHEST FINDINGS  Cardiovascular: Heart size is normal. There is no significant pericardial fluid, thickening or pericardial calcification. There is aortic atherosclerosis, as well as atherosclerosis of the great vessels of the mediastinum and the coronary arteries, including calcified  atherosclerotic plaque in the left main, left anterior descending, left circumflex and right coronary arteries. Severe thickening calcifications of the aortic valve.  Mediastinum/Lymph Nodes: No pathologically enlarged mediastinal or hilar lymph nodes. Esophagus is unremarkable in appearance. No axillary lymphadenopathy.  Lungs/Pleura: There are several scattered 2-3 mm pulmonary nodules noted throughout the lungs bilaterally, highly nonspecific, but statistically likely benign. No other larger more suspicious appearing pulmonary nodules or masses are noted. No acute consolidative airspace disease. No pleural effusions.  Musculoskeletal/Soft Tissues: There are no aggressive appearing lytic or blastic lesions noted in the visualized portions of the skeleton.  CTA ABDOMEN AND PELVIS FINDINGS  Hepatobiliary: No suspicious cystic or solid hepatic lesions. No intra or extrahepatic biliary ductal dilatation. Small calcified gallstone measuring 5 mm lying dependently in the gallbladder. No findings to suggest an acute cholecystitis at this time.  Pancreas: No pancreatic mass. No pancreatic ductal dilatation. No pancreatic or peripancreatic fluid or inflammatory changes.  Spleen: Unremarkable.  Adrenals/Urinary Tract: Subcentimeter low-attenuation lesions in the lower poles of both kidneys are too small to definitively characterize, but are statistically likely tiny cysts. No suspicious renal lesions. No hydroureteronephrosis. Bilateral adrenal glands are normal in appearance. Urinary bladder is thickened and heavily trabeculated with multiple small bladder diverticulae.  Stomach/Bowel: The appearance of the stomach is normal. There is no pathologic dilatation of small bowel or colon. Normal appendix.  Vascular/Lymphatic: Aortic atherosclerosis, with vascular findings and measurements pertinent to potential TAVR procedure, as detailed below. No aneurysm or dissection noted  in the abdominal or pelvic vasculature. Celiac axis, superior mesenteric artery and inferior mesenteric artery are all widely patent without definite hemodynamically significant stenosis. Two left-sided and 1 right-sided renal arteries are all widely patent. No lymphadenopathy noted in the abdomen or pelvis.  Reproductive: Prostate gland is markedly enlarged with median lobe hypertrophy, measuring 7.4 x 6.6 x 7.8 cm. Seminal vesicles are unremarkable in appearance.  Other: No significant volume of ascites.  No pneumoperitoneum.  Musculoskeletal: There are no aggressive appearing lytic or blastic lesions noted in the visualized portions of the skeleton.  VASCULAR MEASUREMENTS PERTINENT TO TAVR:  AORTA:  Minimal Aortic Diameter -  17 x 17 mm  Severity of Aortic Calcification -  moderate to severe  RIGHT PELVIS:  Right Common Iliac Artery -  Minimal Diameter - 10.6 x 8.4 mm  Tortuosity - moderate  Calcification - mild to moderate  Right External Iliac Artery -  Minimal Diameter - 9.1 x 7.2 mm  Tortuosity - moderate to severe  Calcification - mild  Right Common Femoral Artery -  Minimal Diameter - 8.5 x 6.6  mm  Tortuosity - mild  Calcification - mild  LEFT PELVIS:  Left Common Iliac Artery -  Minimal Diameter - 7.4 x 6.8 mm  Tortuosity - mild  Calcification - mild to moderate  Left External Iliac Artery -  Minimal Diameter - 8.3 x 8.3 mm  Tortuosity - moderate to severe  Calcification - none  Left Common Femoral Artery -  Minimal Diameter - 9.5 x 9.4 mm  Tortuosity - mild  Calcification - minimal  Review of the MIP images confirms the above findings.  IMPRESSION: 1. Vascular findings and measurements pertinent to potential TAVR procedure, as detailed above. This patient appears to have suitable pelvic arterial access bilaterally. 2. Severe thickening and calcification of the aortic valve, compatible  with the reported clinical history of severe aortic stenosis. 3. Aortic atherosclerosis, in addition to left main and 3 vessel coronary artery disease. 4. Severely thickened and heavily trabeculated urinary bladder with multiple small bladder wall diverticulae, presumably from chronic bladder outlet obstruction related to the patient's enlarged prostate gland. 5. Multiple tiny 2-3 mm pulmonary nodules scattered throughout the lungs bilaterally, highly nonspecific but statistically likely benign. No follow-up needed if patient is low-risk (and has no known or suspected primary neoplasm). Non-contrast chest CT can be considered in 12 months if patient is high-risk. This recommendation follows the consensus statement: Guidelines for Management of Incidental Pulmonary Nodules Detected on CT Images: From the Fleischner Society 2017; Radiology 2017; 284:228-243. 6. Additional incidental findings, as above.   Electronically Signed   By: Vinnie Langton M.D.   On: 08/25/2016 12:38      RISK SCORES About the STS Risk Calculator Procedure: AV Replacement + CAB  Risk of Mortality: 2.51%  Morbidity or Mortality: 18.142%  Long Length of Stay: 9.28%  Short Length of Stay: 29.072%  Permanent Stroke: 1.734%  Prolonged Ventilation: 10.188%  DSW Infection: 0.397%  Renal Failure: 6.182%  Reoperation: 7.746%   Impression:  This 83 year old gentleman has stage D, severe, symptomatic aortic stenosis with fatigue and shortness of breath with moderate exertion, NYHA class II. I have personally reviewed his 2D echo, TEE and cath films. He has a trileaflet aortic valve that is severely calcified with severe aortic stenosis and a mean gradient of 49 mm Hg by TEE and 41 mm Hg by 2D echo. LV function is normal. He has a 60% mid RCA stenosis but has had no chest pain. I think aortic valve replacement is indicated in this patient with progressive symptoms and severe AS. He would be a candidate for  open surgical AVR and CABG to the RCA but given his age he would like to avoid open surgery if possible. I think TAVR may be a reasonable alternative for him given his age. His RCA stenosis could be treated medically for now and has been stable for a long time. His STS PROM score puts him in the low risk group for open surgery but given his age I think recovery from open surgery would be longer and more difficult for him. He does have significant atheromatous plaque in his aortic arch and descending aorta on TEE which may increase his risk of stroke with aortic cannulation for open surgery. I have personally reviewed his gated cardiac CT and CTA of the chest, abdomen and pelvis. He has a functionally bicuspid aortic valve with a raphe between the left and right cusps. There is severe calcification and thickening of the leaflets. The annular measurement of 666 mm2 appears adequate for a 29 mm Sapien 3 valve. His pelvic arterial anatomy is suitable for a transfemoral approach.   The patient and his wife were counseled at length regarding treatment alternatives for management of severe symptomatic aortic stenosis. Alternative approaches such as conventional aortic valve replacement, transcatheter aortic valve replacement, and palliative medical therapy were compared and contrasted at length. The risks associated with conventional surgical aortic valve replacement were been discussed in detail, as were expectations for post-operative convalescence.  Long-term prognosis with medical therapy was discussed.  We discussed complications that might develop including but not limited to risks of death, stroke, paravalvular leak, aortic dissection or other major vascular complications, aortic annulus rupture, device embolization, cardiac rupture or perforation, mitral regurgitation, acute myocardial infarction, arrhythmia, heart block or bradycardia requiring permanent pacemaker placement, congestive heart failure,  respiratory failure, renal failure, pneumonia, infection, other late complications related to structural valve deterioration or migration, or other complications that might ultimately cause a temporary or permanent loss of functional independence or other long term morbidity. The patient provides full informed consent for the procedure as described and all questions were answered.    Plan:  Transfemoral TAVR using a 29 mm Sapien 3 valve.  Gaye Pollack, MD

## 2016-09-02 ENCOUNTER — Encounter (HOSPITAL_COMMUNITY): Payer: Self-pay | Admitting: General Practice

## 2016-09-02 ENCOUNTER — Encounter (HOSPITAL_COMMUNITY)
Admission: RE | Disposition: A | Discharge status: Home / Self Care | Payer: Self-pay | Source: Ambulatory Visit | Attending: Cardiovascular Disease

## 2016-09-02 ENCOUNTER — Inpatient Hospital Stay (HOSPITAL_COMMUNITY)
Admission: RE | Admit: 2016-09-02 | Discharge: 2016-09-04 | DRG: 378 | Disposition: A | Discharge status: Home / Self Care | Payer: Medicare Other | Source: Ambulatory Visit | Attending: Cardiovascular Disease | Admitting: Cardiovascular Disease

## 2016-09-02 DIAGNOSIS — D638 Anemia in other chronic diseases classified elsewhere: Secondary | ICD-10-CM | POA: Diagnosis not present

## 2016-09-02 DIAGNOSIS — E669 Obesity, unspecified: Secondary | ICD-10-CM | POA: Diagnosis present

## 2016-09-02 DIAGNOSIS — E119 Type 2 diabetes mellitus without complications: Secondary | ICD-10-CM | POA: Diagnosis present

## 2016-09-02 DIAGNOSIS — K5731 Diverticulosis of large intestine without perforation or abscess with bleeding: Principal | ICD-10-CM | POA: Diagnosis present

## 2016-09-02 DIAGNOSIS — K922 Gastrointestinal hemorrhage, unspecified: Secondary | ICD-10-CM

## 2016-09-02 DIAGNOSIS — Z9289 Personal history of other medical treatment: Secondary | ICD-10-CM

## 2016-09-02 DIAGNOSIS — I350 Nonrheumatic aortic (valve) stenosis: Secondary | ICD-10-CM | POA: Diagnosis not present

## 2016-09-02 DIAGNOSIS — Z6829 Body mass index (BMI) 29.0-29.9, adult: Secondary | ICD-10-CM

## 2016-09-02 DIAGNOSIS — Z79899 Other long term (current) drug therapy: Secondary | ICD-10-CM

## 2016-09-02 DIAGNOSIS — I2510 Atherosclerotic heart disease of native coronary artery without angina pectoris: Secondary | ICD-10-CM | POA: Diagnosis not present

## 2016-09-02 DIAGNOSIS — K921 Melena: Secondary | ICD-10-CM | POA: Diagnosis not present

## 2016-09-02 DIAGNOSIS — K5791 Diverticulosis of intestine, part unspecified, without perforation or abscess with bleeding: Secondary | ICD-10-CM | POA: Diagnosis not present

## 2016-09-02 DIAGNOSIS — Q231 Congenital insufficiency of aortic valve: Secondary | ICD-10-CM | POA: Diagnosis not present

## 2016-09-02 DIAGNOSIS — E785 Hyperlipidemia, unspecified: Secondary | ICD-10-CM | POA: Diagnosis not present

## 2016-09-02 DIAGNOSIS — I10 Essential (primary) hypertension: Secondary | ICD-10-CM | POA: Diagnosis not present

## 2016-09-02 DIAGNOSIS — Q2733 Arteriovenous malformation of digestive system vessel: Secondary | ICD-10-CM | POA: Diagnosis not present

## 2016-09-02 DIAGNOSIS — R531 Weakness: Secondary | ICD-10-CM | POA: Diagnosis present

## 2016-09-02 DIAGNOSIS — D649 Anemia, unspecified: Secondary | ICD-10-CM | POA: Diagnosis not present

## 2016-09-02 DIAGNOSIS — Z7984 Long term (current) use of oral hypoglycemic drugs: Secondary | ICD-10-CM

## 2016-09-02 DIAGNOSIS — R42 Dizziness and giddiness: Secondary | ICD-10-CM | POA: Diagnosis not present

## 2016-09-02 HISTORY — DX: Personal history of other medical treatment: Z92.89

## 2016-09-02 LAB — POCT I-STAT 4, (NA,K, GLUC, HGB,HCT)
Glucose, Bld: 178 mg/dL — ABNORMAL HIGH (ref 65–99)
HCT: 16 % — ABNORMAL LOW (ref 39.0–52.0)
Hemoglobin: 5.4 g/dL — CL (ref 13.0–17.0)
Potassium: 3.9 mmol/L (ref 3.5–5.1)
Sodium: 141 mmol/L (ref 135–145)

## 2016-09-02 LAB — HEMOGLOBIN AND HEMATOCRIT, BLOOD
HEMATOCRIT: 26.8 % — AB (ref 39.0–52.0)
HEMOGLOBIN: 9 g/dL — AB (ref 13.0–17.0)

## 2016-09-02 LAB — CBC
HCT: 20.7 % — ABNORMAL LOW (ref 39.0–52.0)
HEMOGLOBIN: 6.7 g/dL — AB (ref 13.0–17.0)
MCH: 31 pg (ref 26.0–34.0)
MCHC: 32.4 g/dL (ref 30.0–36.0)
MCV: 95.8 fL (ref 78.0–100.0)
Platelets: 124 10*3/uL — ABNORMAL LOW (ref 150–400)
RBC: 2.16 MIL/uL — AB (ref 4.22–5.81)
RDW: 16.3 % — ABNORMAL HIGH (ref 11.5–15.5)
WBC: 5.2 10*3/uL (ref 4.0–10.5)

## 2016-09-02 LAB — BASIC METABOLIC PANEL
ANION GAP: 6 (ref 5–15)
BUN: 18 mg/dL (ref 6–20)
CALCIUM: 8.7 mg/dL — AB (ref 8.9–10.3)
CO2: 23 mmol/L (ref 22–32)
Chloride: 110 mmol/L (ref 101–111)
Creatinine, Ser: 1 mg/dL (ref 0.61–1.24)
Glucose, Bld: 162 mg/dL — ABNORMAL HIGH (ref 65–99)
Potassium: 4.3 mmol/L (ref 3.5–5.1)
SODIUM: 139 mmol/L (ref 135–145)

## 2016-09-02 LAB — GLUCOSE, CAPILLARY
GLUCOSE-CAPILLARY: 185 mg/dL — AB (ref 65–99)
GLUCOSE-CAPILLARY: 198 mg/dL — AB (ref 65–99)
GLUCOSE-CAPILLARY: 154 mg/dL — AB (ref 65–99)
Glucose-Capillary: 125 mg/dL — ABNORMAL HIGH (ref 65–99)

## 2016-09-02 LAB — PREPARE RBC (CROSSMATCH)

## 2016-09-02 LAB — HEMOGLOBIN A1C

## 2016-09-02 SURGERY — IMPLANTATION, AORTIC VALVE, TRANSCATHETER, FEMORAL APPROACH
Anesthesia: General | Site: Chest

## 2016-09-02 MED ORDER — SODIUM CHLORIDE 0.9% FLUSH: 3.0000 mL | INTRAVENOUS | Status: DC | PRN

## 2016-09-02 MED ORDER — SODIUM CHLORIDE 0.9% FLUSH
3.0000 mL | Freq: Two times a day (BID) | INTRAVENOUS | Status: DC
  Administered 2016-09-02 – 2016-09-04 (×2): 3 mL via INTRAVENOUS

## 2016-09-02 MED ORDER — CHLORHEXIDINE GLUCONATE 4 % EX LIQD: 30.0000 mL | CUTANEOUS | Status: DC

## 2016-09-02 MED ORDER — PSYLLIUM 95 % PO PACK
1.0000 | PACK | Freq: Every day | ORAL | Status: DC
  Filled 2016-09-02: qty 1

## 2016-09-02 MED ORDER — ACETAMINOPHEN 325 MG PO TABS
650.0000 mg | ORAL_TABLET | Freq: Four times a day (QID) | ORAL | Status: DC | PRN
Start: 2016-09-02 — End: 2016-09-04

## 2016-09-02 MED ORDER — ROCURONIUM BROMIDE 50 MG/5ML IV SOSY
PREFILLED_SYRINGE | INTRAVENOUS | Status: AC
  Filled 2016-09-02: qty 5

## 2016-09-02 MED ORDER — ONDANSETRON HCL 4 MG/2ML IJ SOLN
INTRAMUSCULAR | Status: AC
  Filled 2016-09-02: qty 2

## 2016-09-02 MED ORDER — ZOLPIDEM TARTRATE 5 MG PO TABS: 5.0000 mg | ORAL_TABLET | Freq: Every evening | ORAL | Status: DC | PRN

## 2016-09-02 MED ORDER — LISINOPRIL 40 MG PO TABS
40.0000 mg | ORAL_TABLET | Freq: Every evening | ORAL | Status: DC
  Administered 2016-09-03: 40 mg via ORAL
  Filled 2016-09-02: qty 1

## 2016-09-02 MED ORDER — SODIUM CHLORIDE 0.9% FLUSH
3.0000 mL | Freq: Two times a day (BID) | INTRAVENOUS | Status: DC
  Administered 2016-09-02 – 2016-09-03 (×2): 3 mL via INTRAVENOUS

## 2016-09-02 MED ORDER — SODIUM CHLORIDE 0.9 % IV SOLN: 250.0000 mL | INTRAVENOUS | Status: DC | PRN

## 2016-09-02 MED ORDER — ACETAMINOPHEN 650 MG RE SUPP: 650.0000 mg | Freq: Four times a day (QID) | RECTAL | Status: DC | PRN

## 2016-09-02 MED ORDER — FENTANYL CITRATE (PF) 250 MCG/5ML IJ SOLN
INTRAMUSCULAR | Status: AC
  Filled 2016-09-02: qty 5

## 2016-09-02 MED ORDER — MIDAZOLAM HCL 2 MG/2ML IJ SOLN
INTRAMUSCULAR | Status: AC
  Filled 2016-09-02: qty 2

## 2016-09-02 MED ORDER — PRAVASTATIN SODIUM 20 MG PO TABS
20.0000 mg | ORAL_TABLET | Freq: Every day | ORAL | Status: DC
  Administered 2016-09-02: 20 mg via ORAL
  Filled 2016-09-02 (×2): qty 1

## 2016-09-02 MED ORDER — INSULIN ASPART 100 UNIT/ML ~~LOC~~ SOLN
0.0000 [IU] | Freq: Three times a day (TID) | SUBCUTANEOUS | Status: DC
  Administered 2016-09-02: 3 [IU] via SUBCUTANEOUS
  Administered 2016-09-03: 2 [IU] via SUBCUTANEOUS
  Administered 2016-09-03: 5 [IU] via SUBCUTANEOUS
  Administered 2016-09-03: 3 [IU] via SUBCUTANEOUS
  Administered 2016-09-04: 2 [IU] via SUBCUTANEOUS

## 2016-09-02 MED ORDER — SODIUM CHLORIDE 0.9 % IV SOLN
Freq: Once | INTRAVENOUS | Status: DC
Start: 2016-09-02 — End: 2016-09-04

## 2016-09-02 MED ORDER — LIDOCAINE 2% (20 MG/ML) 5 ML SYRINGE
INTRAMUSCULAR | Status: AC
  Filled 2016-09-02: qty 5

## 2016-09-02 MED FILL — Magnesium Sulfate Inj 50%: INTRAMUSCULAR | Qty: 10 | Status: AC

## 2016-09-02 MED FILL — Heparin Sodium (Porcine) Inj 1000 Unit/ML: INTRAMUSCULAR | Qty: 30 | Status: AC

## 2016-09-02 MED FILL — Potassium Chloride Inj 2 mEq/ML: INTRAVENOUS | Qty: 40 | Status: AC

## 2016-09-02 NOTE — Anesthesia Preprocedure Evaluation (Signed)
Anesthesia Evaluation  Patient identified by MRN, date of birth, ID band Patient awake    Reviewed: Allergy & Precautions, NPO status , Patient's Chart, lab work & pertinent test results  Airway Mallampati: III  TM Distance: >3 FB Neck ROM: Full    Dental  (+) Teeth Intact, Dental Advisory Given   Pulmonary    breath sounds clear to auscultation       Cardiovascular hypertension,  Rhythm:Regular Rate:Normal     Neuro/Psych    GI/Hepatic   Endo/Other  diabetes  Renal/GU      Musculoskeletal   Abdominal   Peds  Hematology   Anesthesia Other Findings   Reproductive/Obstetrics                             Anesthesia Physical Anesthesia Plan  ASA: III  Anesthesia Plan: General   Post-op Pain Management:    Induction: Intravenous  Airway Management Planned: Oral ETT  Additional Equipment:   Intra-op Plan:   Post-operative Plan: Extubation in OR  Informed Consent: I have reviewed the patients History and Physical, chart, labs and discussed the procedure including the risks, benefits and alternatives for the proposed anesthesia with the patient or authorized representative who has indicated his/her understanding and acceptance.   Dental advisory given  Plan Discussed with: CRNA and Anesthesiologist  Anesthesia Plan Comments:         Anesthesia Quick Evaluation

## 2016-09-02 NOTE — Progress Notes (Signed)
Report called to [facility] on [place].  Patient transported to unit on tele monitor with 2 RNs.

## 2016-09-02 NOTE — Interval H&P Note (Signed)
History and Physical Interval Note:  09/02/2016 5:36 AM  Marvin Houston  has presented today for surgery, with the diagnosis of SEVERE AS  The various methods of treatment have been discussed with the patient and family. After consideration of risks, benefits and other options for treatment, the patient has consented to  Procedure(s): TRANSCATHETER AORTIC VALVE REPLACEMENT, TRANSFEMORAL (N/A) TRANSESOPHAGEAL ECHOCARDIOGRAM (TEE) (N/A) as a surgical intervention .  The patient's history has been reviewed, patient examined, no change in status, stable for surgery.  I have reviewed the patient's chart and labs.  Questions were answered to the patient's satisfaction.     Gaye Pollack

## 2016-09-02 NOTE — Progress Notes (Signed)
Almyra Free from blood bank called panic HGB 6.7.  (pretransfusion)  Result was called to [place].

## 2016-09-02 NOTE — Treatment Plan (Signed)
[facility] aware of consultation.  Will see patient this afternoon, once he has a hospital bed assignment. If acute recurrent bleeding in the meantime, consider transfer to ED and consider tagged RBC study.

## 2016-09-02 NOTE — Consult Note (Signed)
[facility] Gastroenterology Consultation Note  Referring Provider: Dr. Sherren Mocha Primary Care Physician:  Jani Gravel, MD Primary Gastroenterologist:  Dr. Arta Silence   Reason for Consultation:  hematochezia  HPI: BOWDEN Marvin Houston is a 83 y.o. male whom we've been asked to see for hematochezia.  Patient has history of critical aortic stenosis and was scheduled for transcatheter aortic valve replacement (TAVR) today.  Last night, after being admitted pre-procedurally, patient developed sudden onset of lower abdominal crampy discomfort, diaphoresis, near-syncope and voluminous hematochezia.  No further bleeding since last night.  Patient has longstanding, for years, intermittent episodes of blood on tissue paper with wiping, attributed to hemorrhoids.  Patient with history about one year ago for hematochezia, less dramatic than last night's episode but more significant than his "routine" blood when wiping.  At that time, he had a colonoscopy which showed large proximal colonic AVMs, medium-sized transverse and descending colon diverticulosis and moderate internal and external hemorrhoids.  Patient has no abdominal pain, hematemesis, nausea, vomiting.  No blood thinners.  No constipation or fecal straining.  No NSAIDs other than baby aspirin.  Patient with chronic anemia, slow downtrend 12--> over the past 8 months or so, with dark stools on oral iron. In light of all these developments, patient was transferred to regular hospital for admission, and TAVR was canceled.  Past Medical History:  Diagnosis Date  . Diabetes mellitus without complication ([place])   . Dyspnea   . Hypertension   . Stenosis of aorta     Past Surgical History:  Procedure Laterality Date  . CARDIAC CATHETERIZATION    . HERNIA REPAIR    . TEE WITHOUT CARDIOVERSION N/A 07/29/2016   Procedure: TRANSESOPHAGEAL ECHOCARDIOGRAM (TEE);  Surgeon: Adrian Prows, MD;  Location: [facility];  Service: Cardiovascular;  Laterality: N/A;     Prior to Admission medications   Medication Sig Start Date End Date Taking? Authorizing Provider  aspirin EC 81 MG tablet Take 81 mg by mouth every evening.    Yes Historical Provider, MD  Cyanocobalamin (VITAMIN B-12 PO) Take 1 tablet by mouth every evening.    Yes Historical Provider, MD  FeFum-FePoly-FA-B Cmp-C-Biot (FOLIVANE-PLUS PO) Take 1 tablet by mouth 2 (two) times daily.    Yes Historical Provider, MD  lisinopril (PRINIVIL,ZESTRIL) 40 MG tablet Take 40 mg by mouth every evening.    Yes Historical Provider, MD  lovastatin (MEVACOR) 40 MG tablet Take 40 mg by mouth 2 (two) times a week. Mon and Tues   Yes Historical Provider, MD  metFORMIN (GLUCOPHAGE) 500 MG tablet Take 500 mg by mouth 2 (two) times daily with a meal.    Yes Historical Provider, MD  Multiple Vitamin (MULTIVITAMIN WITH MINERALS) TABS tablet Take 1 tablet by mouth every evening.    Yes Historical Provider, MD  Multiple Vitamins-Minerals (VISION VITAMINS PO) Take 1 tablet by mouth every evening.    Yes Historical Provider, MD  psyllium (METAMUCIL) 58.6 % powder Take 1 packet by mouth daily.   Yes Historical Provider, MD  sildenafil (VIAGRA) 100 MG tablet Take 100 mg by mouth once as needed for erectile dysfunction.    Historical Provider, MD    Current Facility-Administered Medications  Medication Dose Route Frequency Provider Last Rate Last Dose  . 0.9 %  sodium chloride infusion  250 mL Intravenous PRN Sherren Mocha, MD      . 0.9 %  sodium chloride infusion   Intravenous Once Sherren Mocha, MD      . acetaminophen (TYLENOL) tablet 650  mg  650 mg Oral Q6H PRN Sherren Mocha, MD       Or  . acetaminophen (TYLENOL) suppository 650 mg  650 mg Rectal Q6H PRN Sherren Mocha, MD      . insulin aspart (novoLOG) injection 0-15 Units  0-15 Units Subcutaneous TID WC Sherren Mocha, MD      . pravastatin (PRAVACHOL) tablet 20 mg  20 mg Oral q1800 Sherren Mocha, MD      . sodium chloride flush (NS) 0.9 % injection 3 mL  3  mL Intravenous Q12H Sherren Mocha, MD      . sodium chloride flush (NS) 0.9 % injection 3 mL  3 mL Intravenous Q12H Sherren Mocha, MD      . sodium chloride flush (NS) 0.9 % injection 3 mL  3 mL Intravenous PRN Sherren Mocha, MD      . zolpidem [facility]) tablet 5 mg  5 mg Oral QHS PRN Sherren Mocha, MD        Allergies as of 08/15/2016  . (No Known Allergies)    History reviewed. No pertinent family history.  Social History   Social History  . Marital status: Married    Spouse name: N/A  . Number of children: N/A  . Years of education: N/A   Occupational History  . Not on file.   Social History Main Topics  . Smoking status: Never Smoker  . Smokeless tobacco: Never Used  . Alcohol use Yes  . Drug use: No  . Sexual activity: Not on file   Other Topics Concern  . Not on file   Social History Narrative  . No narrative on file    Review of Systems: Positive = bold Gen: Denies any fever, chills, rigors, night sweats, anorexia, fatigue, weakness, malaise, involuntary weight loss, and sleep disorder CV: Denies chest pain, angina, palpitations, syncope, orthopnea, PND, peripheral edema, and claudication. Resp: Denies dyspnea, cough, sputum, wheezing, coughing up blood. GI: Described in detail in HPI.    GU : Denies urinary burning, blood in urine, urinary frequency, urinary hesitancy, nocturnal urination, and urinary incontinence. MS: Denies joint pain or swelling.  Denies muscle weakness, cramps, atrophy.  Derm: Denies rash, itching, oral ulcerations, hives, unhealing ulcers.  Psych: Denies depression, anxiety, memory loss, suicidal ideation, hallucinations,  and confusion. Heme: Denies bruising, bleeding, and enlarged lymph nodes. Neuro:  Denies any headaches, dizziness, paresthesias. Endo:  Denies any problems with DM, thyroid, adrenal function.  Physical Exam: Vital signs in last 24 hours: Temp:  [97.7 F (36.5 C)-98.2 F (36.8 C)] 98.2 F (36.8 C) (01/23  1301) Pulse Rate:  [56-66] 59 (01/23 1301) Resp:  [9-20] 20 (01/23 1301) BP: (130-155)/(62-103) 155/73 (01/23 1301) SpO2:  [98 %-100 %] 100 % (01/23 1301) Last BM Date: 09/01/16 (was bloody per pt outpt) General:   Alert, overweight, younger-appearing than stated age, well-developed, well-nourished, pleasant and cooperative in NAD Head:  Normocephalic and atraumatic. Eyes:  Sclera clear, no icterus.   Conjunctiva pale Ears:  Normal auditory acuity. Nose:  No deformity, discharge,  or lesions. Mouth:  No deformity or lesions.  Oropharynx pale and dry Neck:  Thick but supple; no masses or thyromegaly. Lungs:  Clear throughout to auscultation.   No wheezes, crackles, or rhonchi. No acute distress. Heart:  Regular rate and rhythm; harsh IV/VI SEM RUSB, no clicks, rubs,  or gallops. Abdomen:  Soft, protuberant, nontender and nondistended. No masses, hepatosplenomegaly or hernias noted. Normal bowel sounds, without guarding, and without rebound.  Msk:  Symmetrical without gross deformities. Normal posture. Pulses:  Normal pulses noted. Extremities:  Without clubbing or edema. Neurologic:  Alert and  oriented x4; diffusely weak, otherwise grossly normal neurologically. Skin:  Intact without significant lesions or rashes. Psych:  Alert and cooperative. Normal mood and affect.   Lab Results:  Recent Labs  09/02/16 0844  WBC 5.2  HGB 6.7*  HCT 20.7*  PLT 124*   BMET  Recent Labs  09/02/16 0844  NA 139  K 4.3  CL 110  CO2 23  GLUCOSE 162*  BUN 18  CREATININE 1.00  CALCIUM 8.7*   LFT No results for input(s): PROT, ALBUMIN, AST, ALT, ALKPHOS, BILITOT, BILIDIR, IBILI in the last 72 hours. PT/INR No results for input(s): LABPROT, INR in the last 72 hours.  Studies/Results: No results found.  Impression:  1.  Chronic anemia, recent downtrend over the past several months.  This, I suspect, is at least in part due to his large proximal colonic arteriovenous  malformations. 2.  Overt hematochezia, complicated by diaphoresis and pre-syncope.  Resolved since last night.  Suspect this is most likely from colonic diverticulosis.  Colonic AVMs and hemorrhoids (both of which patient has) are not prone to cause such overt dramatic and voluminous bleeding. 3.  Critical aortic stenosis.  Plan:  1.  I had lengthy discussion, about 30 minutes, with patient and family.  I reviewed that doing colonoscopy now would be very unlikely to address why he bled last night (which was ultimately the reason that his planned TAVR was canceled).  As a result, would manage his bleeding medically (gentle volume repletion, serial CBCs) without colonoscopy at the present time. 2.  If patient's Hgb remains stable and no further bleeding over next 36-48 hours, patient could likely be discharged home.  If patient has further rebleeding, would do tagged RBC study as next step in management. 3.  Full liquid diet now, advance tomorrow as tolerated. 4.  No NSAIDs/anticoagulants, if clinical feasible. 5.  Once patient is discharged home, I feel next priority would be TAVR followed thereafter by colonoscopy a few weeks down the road for AVM obliteration.  AVM obliteration in the proximal colon carries not insignificant risk of perforation (~5%), which if occurs, would then entail emergency surgery operative risks of which would be much improved if his aortic stenosis were to be corrected/improved (than if not). 6.  Patient and family understand and are in agreement with this approach. 7.  [facility] will follow.   LOS: 0 days   Chance Karam M  09/02/2016, 2:39 PM  Pager [phone] If no answer or after 5 PM call [phone]

## 2016-09-02 NOTE — H&P (Signed)
History and Physical  Patient ID: Marvin Houston MRN: [id number], SOB: 06/15/1934 83 y.o. Date of Encounter: 09/02/2016, 7:43 AM  Primary Physician: Jani Gravel, MD Primary Cardiologist: Dr Einar Gip  Chief Complaint: Shortness of breath, weakness  HPI: 83 y.o. male w/ PMHx significant for severe aortic stenosis who presented to [facility] on 09/02/2016 for elective TAVR.   The patient has a history of hypertension, diabetes, and hyperlipidemia. He has known coronary artery disease with moderate stenosis of the right coronary artery managed medically for many years without symptoms of angina. The patient has developed progressive and now severe aortic stenosis. He has been evaluated by both Dr. Cyndia Bent and Dr. Servando Snare and presents today for TAVR.  The patient has had progressive fatigue and shortness of breath with exertion over the last 6 months. He has not had chest pain or pressure with physical exertion.  His studies included an echocardiogram demonstrating normal LV systolic function with an ejection fraction of 58%. He was found to have severe aortic stenosis with peak and mean transvalvular gradients of 68 and 41 mmHg, respectively. A TEE demonstrated a mean gradient of 49 mmHg. Cardiac catheterization demonstrated stable 60% mid RCA stenosis unchanged from his previous heart catheterization in 2007. There was no other obstructive disease present.  The patient has a history of lower GI bleeding in April 2017. He underwent EGD and colonoscopy at that time with pertinent findings including internal and sternal hemorrhoids and AVMs in the ascending colon and cecum that were not actively bleeding. Preoperative labs showed a decrease in his hemoglobin to 8.5 mg/dL. However, the patient had no symptoms of active bleeding or blood in his stools. However, last night he had a large bloody bowel movement where he described bright red blood. He became pale and diaphoretic per his wife's report. He  came over and sat down in the chair because he was very weak. He has not had any recurrent bowel movements since last night. He does feel increasingly weak today. His hemoglobin is checked this morning by [employer] and it is 5.4 mg/dL.   Past Medical History:  Diagnosis Date  . Diabetes mellitus without complication ([place])   . Dyspnea   . Hypertension   . Stenosis of aorta      Surgical History:  Past Surgical History:  Procedure Laterality Date  . CARDIAC CATHETERIZATION    . HERNIA REPAIR    . TEE WITHOUT CARDIOVERSION N/A 07/29/2016   Procedure: TRANSESOPHAGEAL ECHOCARDIOGRAM (TEE);  Surgeon: Adrian Prows, MD;  Location: [facility];  Service: Cardiovascular;  Laterality: N/A;     Home Meds: Prior to Admission medications   Medication Sig Start Date End Date Taking? Authorizing Provider  aspirin EC 81 MG tablet Take 81 mg by mouth every evening.    Yes Historical Provider, MD  Cyanocobalamin (VITAMIN B-12 PO) Take 1 tablet by mouth every evening.    Yes Historical Provider, MD  FeFum-FePoly-FA-B Cmp-C-Biot (FOLIVANE-PLUS PO) Take 1 tablet by mouth 2 (two) times daily.    Yes Historical Provider, MD  lisinopril (PRINIVIL,ZESTRIL) 40 MG tablet Take 40 mg by mouth every evening.    Yes Historical Provider, MD  lovastatin (MEVACOR) 40 MG tablet Take 40 mg by mouth 2 (two) times a week. Mon and Tues   Yes Historical Provider, MD  metFORMIN (GLUCOPHAGE) 500 MG tablet Take 500 mg by mouth 2 (two) times daily with a meal.    Yes Historical Provider, MD  Multiple Vitamin (MULTIVITAMIN WITH  MINERALS) TABS tablet Take 1 tablet by mouth every evening.    Yes Historical Provider, MD  Multiple Vitamins-Minerals (VISION VITAMINS PO) Take 1 tablet by mouth every evening.    Yes Historical Provider, MD  psyllium (METAMUCIL) 58.6 % powder Take 1 packet by mouth daily.   Yes Historical Provider, MD  sildenafil (VIAGRA) 100 MG tablet Take 100 mg by mouth once as needed for erectile dysfunction.    Historical  Provider, MD    Allergies:  Allergies  Allergen Reactions  . No Known Allergies     Social History   Social History  . Marital status: Married    Spouse name: N/A  . Number of children: N/A  . Years of education: N/A   Occupational History  . Not on file.   Social History Main Topics  . Smoking status: Never Smoker  . Smokeless tobacco: Never Used  . Alcohol use Yes  . Drug use: No  . Sexual activity: Not on file   Other Topics Concern  . Not on file   Social History Narrative  . No narrative on file     Family hx: brothers - CAD in their 64's, underwent CABG  Review of Systems: General: negative for chills, fever, night sweats or weight changes.  ENT: negative for rhinorrhea or epistaxis Cardiovascular: see HPI Dermatological: negative for rash Respiratory: negative for cough or wheezing, positive for shortness of breath GI: negative for nausea, vomiting. Positive for bright red blood in stool GU: no hematuria. Positive for urgency, frequency Neurologic: negative for visual changes, syncope, headache, or dizziness Heme: no easy bruising or bleeding Endo: negative for excessive thirst, thyroid disorder, or flushing Musculoskeletal: negative for joint pain or swelling, negative for myalgias All other systems reviewed and are otherwise negative except as noted above.  Physical Exam: Blood pressure (!) 130/103, pulse 66, temperature 98.1 F (36.7 C), temperature source Oral, SpO2 98 %. General: Well developed, well nourished, pale, elderly male, alert and oriented, in no acute distress. HEENT: Normocephalic, atraumatic, sclera anicteric Neck: Supple. Carotids 2+. JVP normal Lungs: Clear bilaterally to auscultation without wheezes, rales, or rhonchi. Breathing is unlabored. Heart: RRR with late peaking harsh 3/6 systolic murmur at the RUSB, no diastolic murmur Abdomen: Soft, non-tender, non-distended with normoactive bowel sounds. No hepatomegaly. No  rebound/guarding. No obvious abdominal masses. Back: No CVA tenderness Msk:  Strength and tone appear normal for age. Extremities: No clubbing, cyanosis, or edema.  Distal pedal pulses are 2+ and equal bilaterally. Neuro: CNII-XII intact, moves all extremities spontaneously. Psych:  Responds to questions appropriately with a normal affect. Skin: warm and dry without rash, pale   Labs:   Lab Results  Component Value Date   WBC 5.1 08/29/2016   HGB 8.5 (L) 08/29/2016   HCT 26.0 (L) 08/29/2016   MCV 95.9 08/29/2016   PLT 158 08/29/2016    Recent Labs Lab 08/29/16 0956  NA 137  K 4.2  CL 107  CO2 20*  BUN 16  CREATININE 1.21  CALCIUM 9.2  PROT 6.0*  BILITOT 1.4*  ALKPHOS 56  ALT 16*  AST 22  GLUCOSE 260*   No results for input(s): CKTOTAL, CKMB, TROPONINI in the last 72 hours. No results found for: CHOL, HDL, LDLCALC, TRIG No results found for: DDIMER  Radiology/Studies:  Dg Chest 2 View  Result Date: 08/29/2016 CLINICAL DATA:  Severe aortic stenosis.  Preop EXAM: CHEST  2 VIEW COMPARISON:  CT chest 08/25/2000 FINDINGS: Heart size within normal limits. Atherosclerotic  calcification aortic arch. Vascularity normal. Lungs are clear without infiltrate effusion or mass lesion. No skeletal abnormality. IMPRESSION: No active cardiopulmonary disease. Atherosclerotic aortic arch. Electronically Signed   By: Franchot Gallo M.D.   On: 08/29/2016 10:17   Ct Coronary Morph W/cta Cor W/score W/ca W/cm &/or Wo/cm  Addendum Date: 08/25/2016   ADDENDUM REPORT: 08/25/2016 18:18 CLINICAL DATA:  83 year old male with severe aortic stenosis. EXAM: Cardiac TAVR CT TECHNIQUE: The patient was scanned on a Philips 256 scanner. A 120 kV retrospective scan was triggered in the descending thoracic aorta at 111 HU's. Gantry rotation speed was 270 msecs and collimation was .9 mm. No beta blockade or nitro were given. The 3D data set was reconstructed in 5% intervals of the R-R cycle. Systolic and  diastolic phases were analyzed on a dedicated work station using MPR, MIP and VRT modes. The patient received 80 cc of contrast. FINDINGS: Aortic Valve: Functionally bicuspid aortic valve with non-separated left and right coronary cusp. There is severe calcification and thickening of the leaflets with almost no calcium extending into the LVOT. Aorta: Mild dilatation of the sinotubular junction and upper normal size of the ascending aorta. Mild diffuse calcifications of the aortic arch and descending aorta. Sinotubular Junction:  35 x 34 mm Ascending Thoracic Aorta:  40 x 40 mm Aortic Arch:  31 x 30 mm Descending Thoracic Aorta:  30 x 29 mm Sinus of Valsalva Measurements: Non-coronary:  39 mm Right -coronary:  38 mm Left -coronary:  39 mm Coronary Artery Height above Annulus: Left Main:  14 mm Right Coronary:  13 mm Virtual Basal Annulus Measurements: Maximum/Minimum Diameter:  32 x 26 mm Perimeter:  94 mm Area:  666 mm2 Coronary Arteries:  Normal origin.  Right dominance. Optimum Fluoroscopic Angle for Delivery:  RAO 2 CRA 1 IMPRESSION: 1. Functionally bicuspid aortic valve with non-separated left and right coronary cusp. There is severe calcification and thickening of the leaflets with almost no calcium extending into the LVOT. Annular measurements are at upper limit for the 29 mm Edwards-SAPIEN 3 valve. 2. Mild dilatation of the sinotubular junction and upper normal size of the ascending aorta measuring 40 mm. 3.  Sufficient annulus to coronary distance. 4. Optimum Fluoroscopic Angle for Delivery:  RAO 2 CRA 1 Ena Dawley Electronically Signed   By: Ena Dawley   On: 08/25/2016 18:18   Result Date: 08/25/2016 EXAM: OVER-READ INTERPRETATION  CT CHEST The following report is an over-read performed by radiologist Dr. Rebekah Chesterfield [facility] on 08/25/2016. This over-read does not include interpretation of cardiac or coronary anatomy or pathology. The coronary CTA interpretation by the  cardiologist is attached. COMPARISON:  None. FINDINGS: Extracardiac findings will be described on separate dictation for contemporaneously obtained CTA of the chest, abdomen and pelvis. IMPRESSION: Please see separate dictation for contemporaneously obtained CTA of the chest, abdomen and pelvis dated 08/25/2016 for full description of relevant extracardiac findings. Electronically Signed: By: Vinnie Langton M.D. On: 08/25/2016 11:36   Ct Angio Chest Aorta W/cm &/or Wo/cm  Result Date: 08/25/2016 CLINICAL DATA:  83 year old male with history of severe aortic stenosis. Preprocedural study prior to potential transcatheter aortic valve replacement (TAVR) procedure. EXAM: CT ANGIOGRAPHY CHEST, ABDOMEN AND PELVIS TECHNIQUE: Multidetector CT imaging through the chest, abdomen and pelvis was performed using the standard protocol during bolus administration of intravenous contrast. Multiplanar reconstructed images and MIPs were obtained and reviewed to evaluate the vascular anatomy. CONTRAST:  67 mL of Isovue 370. COMPARISON:  CT  the abdomen and pelvis 08/24/2009. FINDINGS: CTA CHEST FINDINGS Cardiovascular: Heart size is normal. There is no significant pericardial fluid, thickening or pericardial calcification. There is aortic atherosclerosis, as well as atherosclerosis of the great vessels of the mediastinum and the coronary arteries, including calcified atherosclerotic plaque in the left main, left anterior descending, left circumflex and right coronary arteries. Severe thickening calcifications of the aortic valve. Mediastinum/Lymph Nodes: No pathologically enlarged mediastinal or hilar lymph nodes. Esophagus is unremarkable in appearance. No axillary lymphadenopathy. Lungs/Pleura: There are several scattered 2-3 mm pulmonary nodules noted throughout the lungs bilaterally, highly nonspecific, but statistically likely benign. No other larger more suspicious appearing pulmonary nodules or masses are noted. No acute  consolidative airspace disease. No pleural effusions. Musculoskeletal/Soft Tissues: There are no aggressive appearing lytic or blastic lesions noted in the visualized portions of the skeleton. CTA ABDOMEN AND PELVIS FINDINGS Hepatobiliary: No suspicious cystic or solid hepatic lesions. No intra or extrahepatic biliary ductal dilatation. Small calcified gallstone measuring 5 mm lying dependently in the gallbladder. No findings to suggest an acute cholecystitis at this time. Pancreas: No pancreatic mass. No pancreatic ductal dilatation. No pancreatic or peripancreatic fluid or inflammatory changes. Spleen: Unremarkable. Adrenals/Urinary Tract: Subcentimeter low-attenuation lesions in the lower poles of both kidneys are too small to definitively characterize, but are statistically likely tiny cysts. No suspicious renal lesions. No hydroureteronephrosis. Bilateral adrenal glands are normal in appearance. Urinary bladder is thickened and heavily trabeculated with multiple small bladder diverticulae. Stomach/Bowel: The appearance of the stomach is normal. There is no pathologic dilatation of small bowel or colon. Normal appendix. Vascular/Lymphatic: Aortic atherosclerosis, with vascular findings and measurements pertinent to potential TAVR procedure, as detailed below. No aneurysm or dissection noted in the abdominal or pelvic vasculature. Celiac axis, superior mesenteric artery and inferior mesenteric artery are all widely patent without definite hemodynamically significant stenosis. Two left-sided and 1 right-sided renal arteries are all widely patent. No lymphadenopathy noted in the abdomen or pelvis. Reproductive: Prostate gland is markedly enlarged with median lobe hypertrophy, measuring 7.4 x 6.6 x 7.8 cm. Seminal vesicles are unremarkable in appearance. Other: No significant volume of ascites.  No pneumoperitoneum. Musculoskeletal: There are no aggressive appearing lytic or blastic lesions noted in the visualized  portions of the skeleton. VASCULAR MEASUREMENTS PERTINENT TO TAVR: AORTA: Minimal Aortic Diameter -  17 x 17 mm Severity of Aortic Calcification -  moderate to severe RIGHT PELVIS: Right Common Iliac Artery - Minimal Diameter - 10.6 x 8.4 mm Tortuosity - moderate Calcification - mild to moderate Right External Iliac Artery - Minimal Diameter - 9.1 x 7.2 mm Tortuosity - moderate to severe Calcification - mild Right Common Femoral Artery - Minimal Diameter - 8.5 x 6.6  mm Tortuosity - mild Calcification - mild LEFT PELVIS: Left Common Iliac Artery - Minimal Diameter - 7.4 x 6.8 mm Tortuosity - mild Calcification - mild to moderate Left External Iliac Artery - Minimal Diameter - 8.3 x 8.3 mm Tortuosity - moderate to severe Calcification - none Left Common Femoral Artery - Minimal Diameter - 9.5 x 9.4 mm Tortuosity - mild Calcification - minimal Review of the MIP images confirms the above findings. IMPRESSION: 1. Vascular findings and measurements pertinent to potential TAVR procedure, as detailed above. This patient appears to have suitable pelvic arterial access bilaterally. 2. Severe thickening and calcification of the aortic valve, compatible with the reported clinical history of severe aortic stenosis. 3. Aortic atherosclerosis, in addition to left main and 3 vessel coronary artery disease. 4.  Severely thickened and heavily trabeculated urinary bladder with multiple small bladder wall diverticulae, presumably from chronic bladder outlet obstruction related to the patient's enlarged prostate gland. 5. Multiple tiny 2-3 mm pulmonary nodules scattered throughout the lungs bilaterally, highly nonspecific but statistically likely benign. No follow-up needed if patient is low-risk (and has no known or suspected primary neoplasm). Non-contrast chest CT can be considered in 12 months if patient is high-risk. This recommendation follows the consensus statement: Guidelines for Management of Incidental Pulmonary Nodules  Detected on CT Images: From the Fleischner Society 2017; Radiology 2017; 284:228-243. 6. Additional incidental findings, as above. Electronically Signed   By: Vinnie Langton M.D.   On: 08/25/2016 12:38   Ct Angio Abd/pel W/ And/or W/o  Result Date: 08/25/2016 CLINICAL DATA:  83 year old male with history of severe aortic stenosis. Preprocedural study prior to potential transcatheter aortic valve replacement (TAVR) procedure. EXAM: CT ANGIOGRAPHY CHEST, ABDOMEN AND PELVIS TECHNIQUE: Multidetector CT imaging through the chest, abdomen and pelvis was performed using the standard protocol during bolus administration of intravenous contrast. Multiplanar reconstructed images and MIPs were obtained and reviewed to evaluate the vascular anatomy. CONTRAST:  67 mL of Isovue 370. COMPARISON:  CT the abdomen and pelvis 08/24/2009. FINDINGS: CTA CHEST FINDINGS Cardiovascular: Heart size is normal. There is no significant pericardial fluid, thickening or pericardial calcification. There is aortic atherosclerosis, as well as atherosclerosis of the great vessels of the mediastinum and the coronary arteries, including calcified atherosclerotic plaque in the left main, left anterior descending, left circumflex and right coronary arteries. Severe thickening calcifications of the aortic valve. Mediastinum/Lymph Nodes: No pathologically enlarged mediastinal or hilar lymph nodes. Esophagus is unremarkable in appearance. No axillary lymphadenopathy. Lungs/Pleura: There are several scattered 2-3 mm pulmonary nodules noted throughout the lungs bilaterally, highly nonspecific, but statistically likely benign. No other larger more suspicious appearing pulmonary nodules or masses are noted. No acute consolidative airspace disease. No pleural effusions. Musculoskeletal/Soft Tissues: There are no aggressive appearing lytic or blastic lesions noted in the visualized portions of the skeleton. CTA ABDOMEN AND PELVIS FINDINGS Hepatobiliary: No  suspicious cystic or solid hepatic lesions. No intra or extrahepatic biliary ductal dilatation. Small calcified gallstone measuring 5 mm lying dependently in the gallbladder. No findings to suggest an acute cholecystitis at this time. Pancreas: No pancreatic mass. No pancreatic ductal dilatation. No pancreatic or peripancreatic fluid or inflammatory changes. Spleen: Unremarkable. Adrenals/Urinary Tract: Subcentimeter low-attenuation lesions in the lower poles of both kidneys are too small to definitively characterize, but are statistically likely tiny cysts. No suspicious renal lesions. No hydroureteronephrosis. Bilateral adrenal glands are normal in appearance. Urinary bladder is thickened and heavily trabeculated with multiple small bladder diverticulae. Stomach/Bowel: The appearance of the stomach is normal. There is no pathologic dilatation of small bowel or colon. Normal appendix. Vascular/Lymphatic: Aortic atherosclerosis, with vascular findings and measurements pertinent to potential TAVR procedure, as detailed below. No aneurysm or dissection noted in the abdominal or pelvic vasculature. Celiac axis, superior mesenteric artery and inferior mesenteric artery are all widely patent without definite hemodynamically significant stenosis. Two left-sided and 1 right-sided renal arteries are all widely patent. No lymphadenopathy noted in the abdomen or pelvis. Reproductive: Prostate gland is markedly enlarged with median lobe hypertrophy, measuring 7.4 x 6.6 x 7.8 cm. Seminal vesicles are unremarkable in appearance. Other: No significant volume of ascites.  No pneumoperitoneum. Musculoskeletal: There are no aggressive appearing lytic or blastic lesions noted in the visualized portions of the skeleton. VASCULAR MEASUREMENTS PERTINENT TO TAVR: AORTA: Minimal Aortic  Diameter -  17 x 17 mm Severity of Aortic Calcification -  moderate to severe RIGHT PELVIS: Right Common Iliac Artery - Minimal Diameter - 10.6 x 8.4 mm  Tortuosity - moderate Calcification - mild to moderate Right External Iliac Artery - Minimal Diameter - 9.1 x 7.2 mm Tortuosity - moderate to severe Calcification - mild Right Common Femoral Artery - Minimal Diameter - 8.5 x 6.6  mm Tortuosity - mild Calcification - mild LEFT PELVIS: Left Common Iliac Artery - Minimal Diameter - 7.4 x 6.8 mm Tortuosity - mild Calcification - mild to moderate Left External Iliac Artery - Minimal Diameter - 8.3 x 8.3 mm Tortuosity - moderate to severe Calcification - none Left Common Femoral Artery - Minimal Diameter - 9.5 x 9.4 mm Tortuosity - mild Calcification - minimal Review of the MIP images confirms the above findings. IMPRESSION: 1. Vascular findings and measurements pertinent to potential TAVR procedure, as detailed above. This patient appears to have suitable pelvic arterial access bilaterally. 2. Severe thickening and calcification of the aortic valve, compatible with the reported clinical history of severe aortic stenosis. 3. Aortic atherosclerosis, in addition to left main and 3 vessel coronary artery disease. 4. Severely thickened and heavily trabeculated urinary bladder with multiple small bladder wall diverticulae, presumably from chronic bladder outlet obstruction related to the patient's enlarged prostate gland. 5. Multiple tiny 2-3 mm pulmonary nodules scattered throughout the lungs bilaterally, highly nonspecific but statistically likely benign. No follow-up needed if patient is low-risk (and has no known or suspected primary neoplasm). Non-contrast chest CT can be considered in 12 months if patient is high-risk. This recommendation follows the consensus statement: Guidelines for Management of Incidental Pulmonary Nodules Detected on CT Images: From the Fleischner Society 2017; Radiology 2017; 284:228-243. 6. Additional incidental findings, as above. Electronically Signed   By: Vinnie Langton M.D.   On: 08/25/2016 12:38     EKG: 08/12/2016: sinus brady,  otherwise normal  CARDIAC STUDIES: Transesophageal Echocardiography  Patient: Kender, Hanby MR #: [id number] Study Date: 07/29/2016 Gender: M Age: 30 Height: 177.8 cm Weight: 93 kg BSA: 2.17 m^2 Pt. Status: Room:  ATTENDING Adrian Prows, MD PERFORMING Adrian Prows, MD SONOGRAPHER Florentina Jenny, RDCS ORDERING Kela Millin R  cc:  ------------------------------------------------------------------- LV EF: 55% - 60%  ------------------------------------------------------------------- Indications: Aortic stenosis 424.1.  ------------------------------------------------------------------- History: Risk factors: Hypertension.  ------------------------------------------------------------------- Study Conclusions  - Left ventricle: There was mild concentric hypertrophy. Systolic function was normal. The estimated ejection fraction was in the range of 55% to 60%. - Aortic valve: Mildly calcified annulus. Trileaflet; severely calcified leaflets. There was moderate to severe stenosis. Mean gradient (S): 49 mm Hg. Peak gradient (S): 71 mm Hg. Valve area (VTI): 1.23 cm^2. Valve area (Vmax): 1.4 cm^2. Valve area (Vmean): 1.24 cm^2. LVOT measures 2.5 cm. - Aorta: There was mild atheromatous plaque. There was a localized ectatic segment, in the proximal ascending aorta. and measures 3.9 cm. Sino tubular junctino is normal in size. - Mitral valve: No evidence of vegetation. There was mild regurgitation. - Left atrium: The atrium was dilated. - Right atrium: No evidence of thrombus in the atrial cavity or appendage. - Atrial septum: No defect or patent foramen ovale was identified by color Doppler exam.  ------------------------------------------------------------------- Study data: Study status: Routine. Consent: The risks, benefits, and alternatives to the procedure were explained to  the patient and informed consent was obtained. Procedure: The patient reported no pain pre or post test. Initial setup. The patient was brought to the laboratory.  Surface ECG leads were monitored. Sedation. Conscious sedation was administered by cardiology staff. Transesophageal echocardiography. An adult multiplane transesophageal probe was inserted by the attending cardiologistwithout difficulty. Image quality was adequate. Study completion: The patient tolerated the procedure well. There were no complications. Administered medications: Midazolam, 2mg , IV. Fentanyl, 43mcg, IV. Diagnostic transesophageal echocardiography. 2D and color Doppler. Birthdate: Patient birthdate: 08-07-1934. Age: Patient is 83 yr old. Sex: Gender: male. BMI: 29.4 kg/m^2. Blood pressure: 158/68 Patient status: Outpatient. Study date: Study date: 07/29/2016. Study time: 12:13 PM. Location: Endoscopy.  -------------------------------------------------------------------  ------------------------------------------------------------------- Left ventricle: There was mild concentric hypertrophy. Systolic function was normal. The estimated ejection fraction was in the range of 55% to 60%.  ------------------------------------------------------------------- Aortic valve: Mildly calcified annulus. Trileaflet; severely calcified leaflets. Doppler: There was moderate to severe stenosis. Doppler assessment for regurgitation was not performed. VTI ratio of LVOT to aortic valve: 0.25. Valve area (VTI): 1.23 cm^2. Indexed valve area (VTI): 0.57 cm^2/m^2. Peak velocity ratio of LVOT to aortic valve: 0.29. Valve area (Vmax): 1.4 cm^2. Indexed valve area (Vmax): 0.65 cm^2/m^2. Mean velocity ratio of LVOT to aortic valve: 0.25. Valve area (Vmean): 1.24 cm^2. Indexed valve area (Vmean): 0.57 cm^2/m^2. Mean gradient (S): 49 mm Hg. Peak gradient (S): 71 mm  Hg.  ------------------------------------------------------------------- Aorta: There was mild atheromatous plaque. There was a localized ectatic segment, in the proximal ascending aorta. and measures 3.9 cm. Sino tubular junctino is normal in size.  ------------------------------------------------------------------- Mitral valve: Structurally normal valve. Leaflet separation was normal. No evidence of vegetation. Doppler: There was mild regurgitation.  ------------------------------------------------------------------- Left atrium: The atrium was dilated. The appendage was morphologically a left appendage.  ------------------------------------------------------------------- Atrial septum: No defect or patent foramen ovale was identified by color Doppler exam.  ------------------------------------------------------------------- Right ventricle: The cavity size was normal. Wall thickness was normal. Systolic function was normal.  ------------------------------------------------------------------- Pulmonic valve: Structurally normal valve. Cusp separation was normal. Doppler: There was trivial regurgitation.  ------------------------------------------------------------------- Tricuspid valve: Structurally normal valve. Leaflet separation was normal. Doppler: There was trivial regurgitation.  ------------------------------------------------------------------- Right atrium: The atrium was normal in size. No evidence of thrombus in the atrial cavity or appendage.  ------------------------------------------------------------------- Pericardium: The pericardium was normal in appearance. There was no pericardial effusion.  ------------------------------------------------------------------- Measurements  Left ventricle Value Stroke volume, 2D 141 ml Stroke volume/bsa, 2D  65 ml/m^2  LVOT Value LVOT ID, S 25 mm LVOT area 4.91 cm^2 LVOT peak velocity, S 120 cm/s LVOT mean velocity, S 84.1 cm/s LVOT VTI, S 28.7 cm LVOT peak gradient, S 6 mm Hg  Aortic valve Value Aortic valve peak velocity, S 421 cm/s Aortic valve mean velocity, S 334 cm/s Aortic valve VTI, S 115 cm Aortic mean gradient, S 49 mm Hg Aortic peak gradient, S 71 mm Hg VTI ratio, LVOT/AV 0.25 Aortic valve area, VTI 1.23 cm^2 Aortic valve area/bsa, VTI 0.57 cm^2/m^2 Velocity ratio, peak, LVOT/AV 0.29 Aortic valve area, peak velocity 1.4 cm^2 Aortic valve area/bsa, peak velocity 0.65 cm^2/m^2 Velocity ratio, mean, LVOT/AV 0.25 Aortic valve area, mean velocity 1.24 cm^2 Aortic valve area/bsa, mean velocity 0.57 cm^2/m^2  Legend: (L) and (H) mark values outside specified reference range.  ------------------------------------------------------------------- Prepared and Electronically Authenticated by  Adrian Prows, MD 2017-12-19T18:38:12   [name]  Cardiac catheterization  Order# [id number]  Reading physician: Adrian Prows, MD Ordering physician: Adrian Prows, MD Study date: 08/12/16  Physicians   Panel Physicians Referring Physician Case Authorizing Physician  Adrian Prows, MD (Primary)    Procedures   Right Heart Cath and Coronary  Angiography  Conclusion   1. LV gram not performed due to inability to cross the calcified aortic valve. 2. Heavy calcification of the aortic valve annulus and mitral valve annular  calcification. 3. Mild coronary calcification without high-grade stenosis in the coronary arteries involving the left system. Mid RCA has a smooth 60% stenosis, unchanged from 2007 angiography. 4. Smooth ascending, arch and descending thoracic aorta without aneurysm formation. 5. Right heart catheterization revealing mild pulmonary hypertension, preserved chronic output and cardiac index.  Recommendation: Patient will be evaluated for aortic valve replacement. He has an appointment with Dr. Ricki Miller tomorrow. A total of 80 mL contrast utilized.  Procedural Details/Technique   Technical Details Procedure performed:  Left heart catheterization including ascending aortogram, selective right and left coronary arteriography. Right heart catheterization and cannulation of cardiac output and cardiac index by Fick.  Indication: Marvin Houston is a 83 y.o. male with with history of hypertension, hyperlipidemia, Patient with known moderate coronary artery disease involving the RCA by: Angiography in 2007 with a 60% stenosis. At recent TEE has confirmed severe aortic stenosis by mean pressure gradient criteria of 49 mmHg, peak gradient 71 mmHg, but continuity aortic valve area was 1.23 cm. He has been complaining of new onset of shortness of breath and dyspnea on exertion. Hence now brought for right and left heart catheterization and for possible need for aortic valve replacement.  Right AC vein access for right heart and Right radial access for left heart catheterization was utilized for performing the procedure.   Technique:   A 5 French sheath introduced into right right AC vein for access under fluroscopic guidance. A 5 French Swan-Ganz catheter was advanced with balloon inflated under fluoroscopic guidance into first the right atrium followed by the right ventricle and into the pulmonary artery to pulmonary artery wedge position. Hemodynamics were obtained in a locations.  After hemodynamics were  completed, samples were taken for SaO2% measurement to be used in Fick Calculation of cardiac output and cardiac index. The catheter was then pulled back the balloon down and then completely out of the body. Hemostasis obtained by manual pressure over the access site.  Under sterile precautions using a 6 French right radial arterial access, a 6 French sheath was introduced into the right radial artery. A 5 [place] Tig 4 catheter was advanced into the ascending aorta selective right coronary artery and left coronary artery was cannulated and angiography was performed in multiple views. The catheter was pulled back Out of the body over exchange length J-wire.   I was unable to perform LV gram due to inability to cross the aortic valve due to heavy calcification and catheter bounce and shipping probably due to aortic stenosis.   A pigtail catheter was utilized to perform ascending aortogram. Catheter exchanged out of the body over J-Wire. NO immediate complications noted. Patient tolerated the procedure well.   Estimated blood loss <50 mL.  During this procedure the patient was administered the following to achieve and maintain moderate conscious sedation: Versed 2 mg, Dilaudid 0.5 mg, while the patient's heart rate, blood pressure, and oxygen saturation were continuously monitored. The period of conscious sedation was 35 minutes, of which I was present face-to-face 100% of this time.    Coronary Findings   Dominance: Right  Left Main  Ost LM to LM lesion, 5% stenosed. The lesion is calcified.  Right Coronary Artery  Prox RCA to Mid RCA lesion, 60% stenosed. The lesion is smooth. No change from 2007  Right  Heart   Right Heart Pressures RA 11/8, mean 6 mmHg RV 40/2, EDP 10 mmHg PA 37/13, mean 22 mmHg. PA saturation 63%. PW 16/11, mean 10 mmHg. Aortic saturation 95%. Cardiac output 5.81, cardiac index 2.78, normal.    Left Heart   Mitral Valve The annulus is calcified.    Aortic Valve The  aortic valve is calcified.    Coronary Diagrams   Diagnostic Diagram     Implants        No implant documentation for this case.  PACS Images   Show images for Cardiac catheterization   Link to Procedure Log   Procedure Log    Hemo Data   Flowsheet Row Most Recent Value  Fick Cardiac Output 5.81 L/min  Fick Cardiac Output Index 2.78 (L/min)/BSA  RA A Wave 11 mmHg  RA V Wave 8 mmHg  RA Mean 6 mmHg  RV Systolic Pressure 40 mmHg  RV Diastolic Pressure 2 mmHg  RV EDP 10 mmHg  PA Systolic Pressure 37 mmHg  PA Diastolic Pressure 13 mmHg  PA Mean 22 mmHg  PW A Wave 16 mmHg  PW V Wave 11 mmHg  PW Mean 10 mmHg  AO Systolic Pressure 123456 mmHg  AO Diastolic Pressure 68 mmHg  AO Mean 95 mmHg  QP/QS 1  TPVR Index 7.91 HRUI  TSVR Index 34.19 HRUI  PVR SVR Ratio 0.13  TPVR/TSVR Ratio 0.23    ASSESSMENT AND PLAN:  1. Severe, stage D, aortic stenosis 2. Acute on chronic anemia with symptoms of acute lower GI bleeding 3. Hypertension 4. Coronary artery disease, native vessel, without angina  The patient's TAVR surgery will be postponed. While he does have severe symptomatic aortic stenosis, he is having an acute lower GI bleed. The patient has a history of a similar episode in March 2017 at which time he was found to have colonic AVMs without active bleeding. His hemoglobin has decreased from a baseline of 12.4 down to 8.5 and now after last nights acute episode to 5.4 mg/dL. Will admit him to the hospital, transfuse 2 units of packed red blood cells, and consult GI for further evaluation. His hemodynamics are currently stable and he is having no resting symptoms of shortness of breath or angina.  [name] MD 09/02/2016, 7:43 AM

## 2016-09-02 NOTE — Anesthesia Procedure Notes (Signed)
Central Venous Catheter Insertion Performed by: Myrtie Soman, anesthesiologist Start/End1/23/2018 6:38 AM, 09/02/2016 6:53 AM Patient location: Pre-op. Preanesthetic checklist: patient identified, IV checked, site marked, risks and benefits discussed, surgical consent, monitors and equipment checked, pre-op evaluation, timeout performed and anesthesia consent Position: Trendelenburg Lidocaine 1% used for infiltration and patient sedated Hand hygiene performed , maximum sterile barriers used  and Seldinger technique used Catheter size: 8 Fr Total catheter length 16. Central line was placed.Double lumen Procedure performed using ultrasound guided technique. Ultrasound Notes:anatomy identified, needle tip was noted to be adjacent to the nerve/plexus identified, no ultrasound evidence of intravascular and/or intraneural injection and image(s) printed for medical record Attempts: 1 Following insertion, dressing applied, line sutured and Biopatch. Post procedure assessment: blood return through all ports  Patient tolerated the procedure well with no immediate complications.

## 2016-09-03 ENCOUNTER — Other Ambulatory Visit: Payer: Self-pay | Admitting: *Deleted

## 2016-09-03 LAB — CBC
HEMATOCRIT: 26.9 % — AB (ref 39.0–52.0)
Hemoglobin: 9 g/dL — ABNORMAL LOW (ref 13.0–17.0)
MCH: 30.7 pg (ref 26.0–34.0)
MCHC: 33.5 g/dL (ref 30.0–36.0)
MCV: 91.8 fL (ref 78.0–100.0)
Platelets: 133 10*3/uL — ABNORMAL LOW (ref 150–400)
RBC: 2.93 MIL/uL — ABNORMAL LOW (ref 4.22–5.81)
RDW: 17.6 % — AB (ref 11.5–15.5)
WBC: 4.7 10*3/uL (ref 4.0–10.5)

## 2016-09-03 LAB — HEMOGLOBIN A1C
HEMOGLOBIN A1C: 5 % (ref 4.8–5.6)
MEAN PLASMA GLUCOSE: 97 mg/dL

## 2016-09-03 LAB — GLUCOSE, CAPILLARY
GLUCOSE-CAPILLARY: 160 mg/dL — AB (ref 65–99)
Glucose-Capillary: 147 mg/dL — ABNORMAL HIGH (ref 65–99)
Glucose-Capillary: 236 mg/dL — ABNORMAL HIGH (ref 65–99)
Glucose-Capillary: 146 mg/dL — ABNORMAL HIGH (ref 65–99)

## 2016-09-03 MED ORDER — PRAVASTATIN SODIUM 20 MG PO TABS: 20.0000 mg | ORAL_TABLET | ORAL | Status: DC

## 2016-09-03 MED ORDER — PSYLLIUM 95 % PO PACK
1.0000 | PACK | Freq: Every day | ORAL | Status: DC
  Administered 2016-09-03: 1 via ORAL

## 2016-09-03 NOTE — Progress Notes (Signed)
Subjective: No further bleeding. No abdominal pain.  Objective: Vital signs in last 24 hours: Temp:  [98.1 F (36.7 C)-98.3 F (36.8 C)] 98.1 F (36.7 C) (01/24 0417) Pulse Rate:  [59-78] 78 (01/24 0700) Resp:  [20] 20 (01/23 1301) BP: (130-171)/(66-79) 130/75 (01/24 0700) SpO2:  [96 %-100 %] 98 % (01/24 0700) Weight:  [91.4 kg (201 lb 6.4 oz)] 91.4 kg (201 lb 6.4 oz) (01/24 0417) Weight change:  Last BM Date: 09/02/16  PE: GEN: NAD ABD:  Soft, non  Lab Results: CBC    Component Value Date/Time   WBC 4.7 09/03/2016 0526   RBC 2.93 (L) 09/03/2016 0526   HGB 9.0 (L) 09/03/2016 0526   HCT 26.9 (L) 09/03/2016 0526   PLT 133 (L) 09/03/2016 0526   MCV 91.8 09/03/2016 0526   MCH 30.7 09/03/2016 0526   MCHC 33.5 09/03/2016 0526   RDW 17.6 (H) 09/03/2016 0526   CMP     Component Value Date/Time   NA 139 09/02/2016 0844   K 4.3 09/02/2016 0844   CL 110 09/02/2016 0844   CO2 23 09/02/2016 0844   GLUCOSE 162 (H) 09/02/2016 0844   BUN 18 09/02/2016 0844   CREATININE 1.00 09/02/2016 0844   CALCIUM 8.7 (L) 09/02/2016 0844   PROT 6.0 (L) 08/29/2016 0956   ALBUMIN 3.8 08/29/2016 0956   AST 22 08/29/2016 0956   ALT 16 (L) 08/29/2016 0956   ALKPHOS 56 08/29/2016 0956   BILITOT 1.4 (H) 08/29/2016 0956   GFRNONAA >60 09/02/2016 0844   GFRAA >60 09/02/2016 0844   Assessment:  1.  Chronic anemia, recent downtrend over the past several months.  This, I suspect, is at least in part due to his large proximal colonic arteriovenous malformations. 2.  Overt hematochezia, complicated by diaphoresis and pre-syncope.  Resolved since admission.  Suspect this is most likely from colonic diverticulosis.  Colonic AVMs and hemorrhoids (both of which patient has) are not prone to cause such overt dramatic and voluminous bleeding. 3.  Critical aortic stenosis.  Plan:  1.  Advance diet as tolerated. 2.  No NSAIDs/anticoagulants for the next couple weeks, if clinically feasible (baby ASA  ok). 3.  No GI intervention planned at this time. 4.  Ideally would do TAVR first then consider colonoscopy later for possible APC; will arrange outpatient GI follow-up to discuss this. 5.  [facility] will sign-off; please call with questions; thank you for the consultation.   Landry Dyke 09/03/2016, 12:03 PM   Pager [phone] If no answer or after 5 PM call [phone]

## 2016-09-03 NOTE — Progress Notes (Signed)
Pt's rt IJ was removed earlier today. Pt tolerated it well. Vaseline gauze, with 2 x 2 gauze & clear dressing applied. Pt given education on site care. Hoover Brunette, RN

## 2016-09-03 NOTE — Progress Notes (Signed)
    Subjective:  Feels better after PRBC transfusion yesterday. No CP or dyspnea at rest.   Objective:  Vital Signs in the last 24 hours: Temp:  [97.7 F (36.5 C)-98.3 F (36.8 C)] 98.1 F (36.7 C) (01/24 0417) Pulse Rate:  [59-78] 78 (01/24 0700) Resp:  [14-20] 20 (01/23 1301) BP: (130-171)/(66-79) 130/75 (01/24 0700) SpO2:  [96 %-100 %] 98 % (01/24 0700) Weight:  [201 lb 6.4 oz (91.4 kg)] 201 lb 6.4 oz (91.4 kg) (01/24 0417)  Intake/Output from previous day: 01/23 0701 - 01/24 0700 In: 790 [P.O.:120; Blood:670] Out: 325 [Urine:325]  Physical Exam: Pt is alert and oriented, NAD HEENT: normal Neck: JVP - normal Lungs: CTA bilaterally CV: RRR with 3/6 systolic murmur at the LSB Abd: soft, NT, Positive BS, no hepatomegaly Ext: no C/C/E, distal pulses intact and equal Skin: warm/dry no rash   Lab Results:  Recent Labs  09/02/16 0844 09/02/16 1431 09/03/16 0526  WBC 5.2  --  4.7  HGB 6.7* 9.0* 9.0*  PLT 124*  --  133*    Recent Labs  09/02/16 0703 09/02/16 0844  NA 141 139  K 3.9 4.3  CL  --  110  CO2  --  23  GLUCOSE 178* 162*  BUN  --  18  CREATININE  --  1.00   No results for input(s): TROPONINI in the last 72 hours.  Invalid input(s): CK, MB  Tele: Sinus rhythm  Assessment/Plan:  1.Severe symptomatic aortic stenosis, stage 2. Acute on chronic lower GI bleeding 3. Type 2 diabetes 4. Hypertension  The patient appears to be stable this morning. He is looking better after receiving 2 units of packed red blood cells. Appreciate the GI evaluation of Dr. Paulita Fujita. General plan will be to treat his severe aortic stenosis first, then likely pursue colonoscopy and potential treatment of large AVMs. Will keep him off of aspirin at this time, and follow his hemoglobin. If his hemoglobin is stable tomorrow morning he can likely be discharged on. Will discuss timing of TAVR with Dr. Cyndia Bent. It might be reasonable to consider using aspirin alone after TAVR with  his bleeding risk taken into account.  Disposition: possible DC tomorrow if stable.   Sherren Mocha, M.D. 09/03/2016, 9:25 AM

## 2016-09-04 ENCOUNTER — Other Ambulatory Visit (HOSPITAL_COMMUNITY): Payer: Self-pay | Admitting: *Deleted

## 2016-09-04 ENCOUNTER — Other Ambulatory Visit: Payer: Self-pay | Admitting: *Deleted

## 2016-09-04 DIAGNOSIS — I350 Nonrheumatic aortic (valve) stenosis: Secondary | ICD-10-CM

## 2016-09-04 LAB — CBC
HEMATOCRIT: 26.9 % — AB (ref 39.0–52.0)
Hemoglobin: 8.7 g/dL — ABNORMAL LOW (ref 13.0–17.0)
MCH: 29.8 pg (ref 26.0–34.0)
MCHC: 32.3 g/dL (ref 30.0–36.0)
MCV: 92.1 fL (ref 78.0–100.0)
Platelets: 137 10*3/uL — ABNORMAL LOW (ref 150–400)
RBC: 2.92 MIL/uL — ABNORMAL LOW (ref 4.22–5.81)
RDW: 16.9 % — AB (ref 11.5–15.5)
WBC: 4.8 10*3/uL (ref 4.0–10.5)

## 2016-09-04 LAB — BASIC METABOLIC PANEL
Anion gap: 4 — ABNORMAL LOW (ref 5–15)
BUN: 13 mg/dL (ref 6–20)
CALCIUM: 8.8 mg/dL — AB (ref 8.9–10.3)
CO2: 26 mmol/L (ref 22–32)
CREATININE: 1.12 mg/dL (ref 0.61–1.24)
Chloride: 111 mmol/L (ref 101–111)
GFR calc Af Amer: >60 mL/min (ref >60)
GFR calc non Af Amer: >60 mL/min (ref >60)
Glucose, Bld: 158 mg/dL — ABNORMAL HIGH (ref 65–99)
Potassium: 4.3 mmol/L (ref 3.5–5.1)
Sodium: 141 mmol/L (ref 135–145)

## 2016-09-04 LAB — GLUCOSE, CAPILLARY: GLUCOSE-CAPILLARY: 149 mg/dL — AB (ref 65–99)

## 2016-09-04 MED ORDER — FERROUS SULFATE 325 (65 FE) MG PO TABS: 325.0000 mg | ORAL_TABLET | Freq: Two times a day (BID) | ORAL | 12 refills | Status: AC

## 2016-09-04 MED ORDER — FERROUS SULFATE 325 (65 FE) MG PO TABS
325.0000 mg | ORAL_TABLET | Freq: Two times a day (BID) | ORAL | Status: DC
  Administered 2016-09-04: 325 mg via ORAL
  Filled 2016-09-04: qty 1

## 2016-09-04 NOTE — Discharge Summary (Signed)
Discharge Summary    Patient ID: Marvin Houston,  MRN: [id number], DOB/AGE: 1934-05-02 83 y.o.  Admit date: 09/02/2016 Discharge date: 09/04/2016  Primary Care Provider: Jani Gravel Primary Cardiologist: Dr. Nadyne Coombes, Dr. Burt Knack did TAVR   Discharge Diagnoses    Active Problems:   Aortic stenosis, severe   Acute lower GI bleeding   Allergies Allergies  Allergen Reactions  . No Known Allergies     Diagnostic Studies/Procedures    None _____________   History of Present Illness   83 y.o. male w/ PMHx significant for severe aortic stenosis who presented to [facility] on 09/02/2016 for elective TAVR.   The patient has a history of hypertension, diabetes, and hyperlipidemia. He has known coronary artery disease with moderate stenosis of the right coronary artery managed medically for many years without symptoms of angina. The patient has developed progressive and now severe aortic stenosis. He has been evaluated by both Dr. Cyndia Bent and Dr. Servando Snare and presents for TAVR.  The patient has had progressive fatigue and shortness of breath with exertion over the last 6 months. He has not had chest pain or pressure with physical exertion.  His studies included an echocardiogram demonstrating normal LV systolic function with an ejection fraction of 58%. He was found to have severe aortic stenosis with peak and mean transvalvular gradients of 68 and 41 mmHg, respectively. A TEE demonstrated a mean gradient of 49 mmHg. Cardiac catheterization demonstrated stable 60% mid RCA stenosis unchanged from his previous heart catheterization in 2007. There was no other obstructive disease present.  The patient has a history of lower GI bleeding in April 2017. He underwent EGD and colonoscopy at that time with pertinent findings including internal and sternal hemorrhoids and AVMs in the ascending colon and cecum that were not actively bleeding. Preoperative labs showed a decrease in his hemoglobin  to 8.5 mg/dL. However, the patient had no symptoms of active bleeding or blood in his stools.   However, last night (09/01/16) he had a large bloody bowel movement where he described bright red blood. He became pale and diaphoretic per his wife's report. He came over and sat down in the chair because he was very weak. He has not had any recurrent bowel movements since last night. He does feel increasingly weak. His hemoglobin was checked by [employer] and it is 5.4 mg/dL.      Hospital Course  The patient's TAVR surgery will be postponed. While he does have severe symptomatic aortic stenosis, he is having an acute lower GI bleed. The patient has a history of a similar episode in March 2017 at which time he was found to have colonic AVMs without active bleeding. His hemoglobin has decreased from a baseline of 12.4 down to 8.5 and now after last nights acute episode to 5.4 mg/dL. Will admit him to the hospital, transfuse 2 units of packed red blood cells, and consult GI for further evaluation. His hemodynamics are currently stable and he is having no resting symptoms of shortness of breath or angina.  GI consultation was obtained and it was felt that the bleeding was likely from colonic diverticulosis. No GI intervention is planned at this time. Ideally, TAVR will be done first and then colonoscopy will be considered at a later time.   Per Dr. Burt Knack - The patient appears stable for hospital discharge. Will keep him off of aspirin. He will resume iron twice daily as he was previously doing at home. He will return  Monday for preoperative labs and if his hemoglobin is stable we will plan to proceed with TAVR next week. The patient understands to notify his physicians if there is any more bleeding in his stool and he also understands to seek immediate medical attention if he has any major bleeding, lightheadedness, or other significant change in symptoms.    _____________  Discharge Vitals Blood pressure  125/70, pulse 62, temperature 98.1 F (36.7 C), temperature source Oral, resp. rate 17, weight 199 lb 11.2 oz (90.6 kg), SpO2 97 %.  Filed Weights   09/03/16 0417 09/04/16 0552  Weight: 201 lb 6.4 oz (91.4 kg) 199 lb 11.2 oz (90.6 kg)    Labs & Radiologic Studies     CBC  Recent Labs  09/03/16 0526 09/04/16 0514  WBC 4.7 4.8  HGB 9.0* 8.7*  HCT 26.9* 26.9*  MCV 91.8 92.1  PLT 133* 0000000*   Basic Metabolic Panel  Recent Labs  09/02/16 0844 09/04/16 0514  NA 139 141  K 4.3 4.3  CL 110 111  CO2 23 26  GLUCOSE 162* 158*  BUN 18 13  CREATININE 1.00 1.12  CALCIUM 8.7* 8.8*   Hemoglobin A1C  Recent Labs  09/02/16 0606  HGBA1C 5.0    Dg Chest 2 View  Result Date: 08/29/2016 CLINICAL DATA:  Severe aortic stenosis.  Preop EXAM: CHEST  2 VIEW COMPARISON:  CT chest 08/25/2000 FINDINGS: Heart size within normal limits. Atherosclerotic calcification aortic arch. Vascularity normal. Lungs are clear without infiltrate effusion or mass lesion. No skeletal abnormality. IMPRESSION: No active cardiopulmonary disease. Atherosclerotic aortic arch. Electronically Signed   By: Franchot Gallo M.D.   On: 08/29/2016 10:17   Ct Coronary Morph W/cta Cor W/score W/ca W/cm &/or Wo/cm  Addendum Date: 08/25/2016   ADDENDUM REPORT: 08/25/2016 18:18 CLINICAL DATA:  83 year old male with severe aortic stenosis. EXAM: Cardiac TAVR CT TECHNIQUE: The patient was scanned on a Philips 256 scanner. A 120 kV retrospective scan was triggered in the descending thoracic aorta at 111 HU's. Gantry rotation speed was 270 msecs and collimation was .9 mm. No beta blockade or nitro were given. The 3D data set was reconstructed in 5% intervals of the R-R cycle. Systolic and diastolic phases were analyzed on a dedicated work station using MPR, MIP and VRT modes. The patient received 80 cc of contrast. FINDINGS: Aortic Valve: Functionally bicuspid aortic valve with non-separated left and right coronary cusp. There is  severe calcification and thickening of the leaflets with almost no calcium extending into the LVOT. Aorta: Mild dilatation of the sinotubular junction and upper normal size of the ascending aorta. Mild diffuse calcifications of the aortic arch and descending aorta. Sinotubular Junction:  35 x 34 mm Ascending Thoracic Aorta:  40 x 40 mm Aortic Arch:  31 x 30 mm Descending Thoracic Aorta:  30 x 29 mm Sinus of Valsalva Measurements: Non-coronary:  39 mm Right -coronary:  38 mm Left -coronary:  39 mm Coronary Artery Height above Annulus: Left Main:  14 mm Right Coronary:  13 mm Virtual Basal Annulus Measurements: Maximum/Minimum Diameter:  32 x 26 mm Perimeter:  94 mm Area:  666 mm2 Coronary Arteries:  Normal origin.  Right dominance. Optimum Fluoroscopic Angle for Delivery:  RAO 2 CRA 1 IMPRESSION: 1. Functionally bicuspid aortic valve with non-separated left and right coronary cusp. There is severe calcification and thickening of the leaflets with almost no calcium extending into the LVOT. Annular measurements are at upper limit for the 29 mm Edwards-SAPIEN  3 valve. 2. Mild dilatation of the sinotubular junction and upper normal size of the ascending aorta measuring 40 mm. 3.  Sufficient annulus to coronary distance. 4. Optimum Fluoroscopic Angle for Delivery:  RAO 2 CRA 1 Ena Dawley Electronically Signed   By: Ena Dawley   On: 08/25/2016 18:18   Result Date: 08/25/2016 EXAM: OVER-READ INTERPRETATION  CT CHEST The following report is an over-read performed by radiologist Dr. Rebekah Chesterfield [facility] on 08/25/2016. This over-read does not include interpretation of cardiac or coronary anatomy or pathology. The coronary CTA interpretation by the cardiologist is attached. COMPARISON:  None. FINDINGS: Extracardiac findings will be described on separate dictation for contemporaneously obtained CTA of the chest, abdomen and pelvis. IMPRESSION: Please see separate dictation for contemporaneously  obtained CTA of the chest, abdomen and pelvis dated 08/25/2016 for full description of relevant extracardiac findings. Electronically Signed: By: Vinnie Langton M.D. On: 08/25/2016 11:36   Ct Angio Chest Aorta W/cm &/or Wo/cm  Result Date: 08/25/2016 CLINICAL DATA:  83 year old male with history of severe aortic stenosis. Preprocedural study prior to potential transcatheter aortic valve replacement (TAVR) procedure. EXAM: CT ANGIOGRAPHY CHEST, ABDOMEN AND PELVIS TECHNIQUE: Multidetector CT imaging through the chest, abdomen and pelvis was performed using the standard protocol during bolus administration of intravenous contrast. Multiplanar reconstructed images and MIPs were obtained and reviewed to evaluate the vascular anatomy. CONTRAST:  67 mL of Isovue 370. COMPARISON:  CT the abdomen and pelvis 08/24/2009. FINDINGS: CTA CHEST FINDINGS Cardiovascular: Heart size is normal. There is no significant pericardial fluid, thickening or pericardial calcification. There is aortic atherosclerosis, as well as atherosclerosis of the great vessels of the mediastinum and the coronary arteries, including calcified atherosclerotic plaque in the left main, left anterior descending, left circumflex and right coronary arteries. Severe thickening calcifications of the aortic valve. Mediastinum/Lymph Nodes: No pathologically enlarged mediastinal or hilar lymph nodes. Esophagus is unremarkable in appearance. No axillary lymphadenopathy. Lungs/Pleura: There are several scattered 2-3 mm pulmonary nodules noted throughout the lungs bilaterally, highly nonspecific, but statistically likely benign. No other larger more suspicious appearing pulmonary nodules or masses are noted. No acute consolidative airspace disease. No pleural effusions. Musculoskeletal/Soft Tissues: There are no aggressive appearing lytic or blastic lesions noted in the visualized portions of the skeleton. CTA ABDOMEN AND PELVIS FINDINGS Hepatobiliary: No  suspicious cystic or solid hepatic lesions. No intra or extrahepatic biliary ductal dilatation. Small calcified gallstone measuring 5 mm lying dependently in the gallbladder. No findings to suggest an acute cholecystitis at this time. Pancreas: No pancreatic mass. No pancreatic ductal dilatation. No pancreatic or peripancreatic fluid or inflammatory changes. Spleen: Unremarkable. Adrenals/Urinary Tract: Subcentimeter low-attenuation lesions in the lower poles of both kidneys are too small to definitively characterize, but are statistically likely tiny cysts. No suspicious renal lesions. No hydroureteronephrosis. Bilateral adrenal glands are normal in appearance. Urinary bladder is thickened and heavily trabeculated with multiple small bladder diverticulae. Stomach/Bowel: The appearance of the stomach is normal. There is no pathologic dilatation of small bowel or colon. Normal appendix. Vascular/Lymphatic: Aortic atherosclerosis, with vascular findings and measurements pertinent to potential TAVR procedure, as detailed below. No aneurysm or dissection noted in the abdominal or pelvic vasculature. Celiac axis, superior mesenteric artery and inferior mesenteric artery are all widely patent without definite hemodynamically significant stenosis. Two left-sided and 1 right-sided renal arteries are all widely patent. No lymphadenopathy noted in the abdomen or pelvis. Reproductive: Prostate gland is markedly enlarged with median lobe hypertrophy, measuring 7.4 x 6.6 x  7.8 cm. Seminal vesicles are unremarkable in appearance. Other: No significant volume of ascites.  No pneumoperitoneum. Musculoskeletal: There are no aggressive appearing lytic or blastic lesions noted in the visualized portions of the skeleton. VASCULAR MEASUREMENTS PERTINENT TO TAVR: AORTA: Minimal Aortic Diameter -  17 x 17 mm Severity of Aortic Calcification -  moderate to severe RIGHT PELVIS: Right Common Iliac Artery - Minimal Diameter - 10.6 x 8.4 mm  Tortuosity - moderate Calcification - mild to moderate Right External Iliac Artery - Minimal Diameter - 9.1 x 7.2 mm Tortuosity - moderate to severe Calcification - mild Right Common Femoral Artery - Minimal Diameter - 8.5 x 6.6  mm Tortuosity - mild Calcification - mild LEFT PELVIS: Left Common Iliac Artery - Minimal Diameter - 7.4 x 6.8 mm Tortuosity - mild Calcification - mild to moderate Left External Iliac Artery - Minimal Diameter - 8.3 x 8.3 mm Tortuosity - moderate to severe Calcification - none Left Common Femoral Artery - Minimal Diameter - 9.5 x 9.4 mm Tortuosity - mild Calcification - minimal Review of the MIP images confirms the above findings. IMPRESSION: 1. Vascular findings and measurements pertinent to potential TAVR procedure, as detailed above. This patient appears to have suitable pelvic arterial access bilaterally. 2. Severe thickening and calcification of the aortic valve, compatible with the reported clinical history of severe aortic stenosis. 3. Aortic atherosclerosis, in addition to left main and 3 vessel coronary artery disease. 4. Severely thickened and heavily trabeculated urinary bladder with multiple small bladder wall diverticulae, presumably from chronic bladder outlet obstruction related to the patient's enlarged prostate gland. 5. Multiple tiny 2-3 mm pulmonary nodules scattered throughout the lungs bilaterally, highly nonspecific but statistically likely benign. No follow-up needed if patient is low-risk (and has no known or suspected primary neoplasm). Non-contrast chest CT can be considered in 12 months if patient is high-risk. This recommendation follows the consensus statement: Guidelines for Management of Incidental Pulmonary Nodules Detected on CT Images: From the Fleischner Society 2017; Radiology 2017; 284:228-243. 6. Additional incidental findings, as above. Electronically Signed   By: Vinnie Langton M.D.   On: 08/25/2016 12:38   Ct Angio Abd/pel W/ And/or  W/o  Result Date: 08/25/2016 CLINICAL DATA:  83 year old male with history of severe aortic stenosis. Preprocedural study prior to potential transcatheter aortic valve replacement (TAVR) procedure. EXAM: CT ANGIOGRAPHY CHEST, ABDOMEN AND PELVIS TECHNIQUE: Multidetector CT imaging through the chest, abdomen and pelvis was performed using the standard protocol during bolus administration of intravenous contrast. Multiplanar reconstructed images and MIPs were obtained and reviewed to evaluate the vascular anatomy. CONTRAST:  67 mL of Isovue 370. COMPARISON:  CT the abdomen and pelvis 08/24/2009. FINDINGS: CTA CHEST FINDINGS Cardiovascular: Heart size is normal. There is no significant pericardial fluid, thickening or pericardial calcification. There is aortic atherosclerosis, as well as atherosclerosis of the great vessels of the mediastinum and the coronary arteries, including calcified atherosclerotic plaque in the left main, left anterior descending, left circumflex and right coronary arteries. Severe thickening calcifications of the aortic valve. Mediastinum/Lymph Nodes: No pathologically enlarged mediastinal or hilar lymph nodes. Esophagus is unremarkable in appearance. No axillary lymphadenopathy. Lungs/Pleura: There are several scattered 2-3 mm pulmonary nodules noted throughout the lungs bilaterally, highly nonspecific, but statistically likely benign. No other larger more suspicious appearing pulmonary nodules or masses are noted. No acute consolidative airspace disease. No pleural effusions. Musculoskeletal/Soft Tissues: There are no aggressive appearing lytic or blastic lesions noted in the visualized portions of the skeleton. CTA ABDOMEN AND  PELVIS FINDINGS Hepatobiliary: No suspicious cystic or solid hepatic lesions. No intra or extrahepatic biliary ductal dilatation. Small calcified gallstone measuring 5 mm lying dependently in the gallbladder. No findings to suggest an acute cholecystitis at this time.  Pancreas: No pancreatic mass. No pancreatic ductal dilatation. No pancreatic or peripancreatic fluid or inflammatory changes. Spleen: Unremarkable. Adrenals/Urinary Tract: Subcentimeter low-attenuation lesions in the lower poles of both kidneys are too small to definitively characterize, but are statistically likely tiny cysts. No suspicious renal lesions. No hydroureteronephrosis. Bilateral adrenal glands are normal in appearance. Urinary bladder is thickened and heavily trabeculated with multiple small bladder diverticulae. Stomach/Bowel: The appearance of the stomach is normal. There is no pathologic dilatation of small bowel or colon. Normal appendix. Vascular/Lymphatic: Aortic atherosclerosis, with vascular findings and measurements pertinent to potential TAVR procedure, as detailed below. No aneurysm or dissection noted in the abdominal or pelvic vasculature. Celiac axis, superior mesenteric artery and inferior mesenteric artery are all widely patent without definite hemodynamically significant stenosis. Two left-sided and 1 right-sided renal arteries are all widely patent. No lymphadenopathy noted in the abdomen or pelvis. Reproductive: Prostate gland is markedly enlarged with median lobe hypertrophy, measuring 7.4 x 6.6 x 7.8 cm. Seminal vesicles are unremarkable in appearance. Other: No significant volume of ascites.  No pneumoperitoneum. Musculoskeletal: There are no aggressive appearing lytic or blastic lesions noted in the visualized portions of the skeleton. VASCULAR MEASUREMENTS PERTINENT TO TAVR: AORTA: Minimal Aortic Diameter -  17 x 17 mm Severity of Aortic Calcification -  moderate to severe RIGHT PELVIS: Right Common Iliac Artery - Minimal Diameter - 10.6 x 8.4 mm Tortuosity - moderate Calcification - mild to moderate Right External Iliac Artery - Minimal Diameter - 9.1 x 7.2 mm Tortuosity - moderate to severe Calcification - mild Right Common Femoral Artery - Minimal Diameter - 8.5 x 6.6  mm  Tortuosity - mild Calcification - mild LEFT PELVIS: Left Common Iliac Artery - Minimal Diameter - 7.4 x 6.8 mm Tortuosity - mild Calcification - mild to moderate Left External Iliac Artery - Minimal Diameter - 8.3 x 8.3 mm Tortuosity - moderate to severe Calcification - none Left Common Femoral Artery - Minimal Diameter - 9.5 x 9.4 mm Tortuosity - mild Calcification - minimal Review of the MIP images confirms the above findings. IMPRESSION: 1. Vascular findings and measurements pertinent to potential TAVR procedure, as detailed above. This patient appears to have suitable pelvic arterial access bilaterally. 2. Severe thickening and calcification of the aortic valve, compatible with the reported clinical history of severe aortic stenosis. 3. Aortic atherosclerosis, in addition to left main and 3 vessel coronary artery disease. 4. Severely thickened and heavily trabeculated urinary bladder with multiple small bladder wall diverticulae, presumably from chronic bladder outlet obstruction related to the patient's enlarged prostate gland. 5. Multiple tiny 2-3 mm pulmonary nodules scattered throughout the lungs bilaterally, highly nonspecific but statistically likely benign. No follow-up needed if patient is low-risk (and has no known or suspected primary neoplasm). Non-contrast chest CT can be considered in 12 months if patient is high-risk. This recommendation follows the consensus statement: Guidelines for Management of Incidental Pulmonary Nodules Detected on CT Images: From the Fleischner Society 2017; Radiology 2017; 284:228-243. 6. Additional incidental findings, as above. Electronically Signed   By: Vinnie Langton M.D.   On: 08/25/2016 12:38    Disposition   Pt is being discharged home today in good condition.  Follow-up Plans & Appointments     Discharge Instructions    Diet -  low sodium heart healthy    Complete by:  As directed    Increase activity slowly    Complete by:  As directed        Discharge Medications   Current Discharge Medication List    START taking these medications   Details  ferrous sulfate 325 (65 FE) MG tablet Take 1 tablet (325 mg total) by mouth 2 (two) times daily with a meal. Qty: 60 tablet, Refills: 12      CONTINUE these medications which have NOT CHANGED   Details  Cyanocobalamin (VITAMIN B-12 PO) Take 1 tablet by mouth every evening.     lisinopril (PRINIVIL,ZESTRIL) 40 MG tablet Take 40 mg by mouth every evening.     lovastatin (MEVACOR) 40 MG tablet Take 40 mg by mouth 2 (two) times a week. Mon and Tues    metFORMIN (GLUCOPHAGE) 500 MG tablet Take 500 mg by mouth 2 (two) times daily with a meal.     !! Multiple Vitamin (MULTIVITAMIN WITH MINERALS) TABS tablet Take 1 tablet by mouth every evening.     !! Multiple Vitamins-Minerals (VISION VITAMINS PO) Take 1 tablet by mouth every evening.     psyllium (METAMUCIL) 58.6 % powder Take 1 packet by mouth daily.    sildenafil (VIAGRA) 100 MG tablet Take 100 mg by mouth once as needed for erectile dysfunction.     !! - Potential duplicate medications found. Please discuss with provider.    STOP taking these medications     aspirin EC 81 MG tablet      FeFum-FePoly-FA-B Cmp-C-Biot (FOLIVANE-PLUS PO)             Outstanding Labs/Studies     Duration of Discharge Encounter   Greater than 30 minutes including physician time.  Signed, Arbutus Leas NP 09/04/2016, 11:26 AM

## 2016-09-04 NOTE — Progress Notes (Signed)
    Subjective:  Feels well this morning. He has been up without dizziness or shortness of breath. Had some blood streaks on the toilet paper but no more bleeding in his stool.  Objective:  Vital Signs in the last 24 hours: Temp:  [98 F (36.7 C)-98.6 F (37 C)] 98.1 F (36.7 C) (01/25 0552) Pulse Rate:  [59-62] 62 (01/24 2038) Resp:  [16-18] 17 (01/25 0552) BP: (125-156)/(70-77) 125/70 (01/25 0552) SpO2:  [97 %-100 %] 97 % (01/25 0552) Weight:  [199 lb 11.2 oz (90.6 kg)] 199 lb 11.2 oz (90.6 kg) (01/25 0552)  Intake/Output from previous day: 01/24 0701 - 01/25 0700 In: -  Out: 750 [Urine:750]  Physical Exam: Pt is alert and oriented, NAD HEENT: normal Neck: JVP - normal Lungs: CTA bilaterally CV: RRR with grade 3/6 systolic murmur at the LSB Abd: soft, NT, Positive BS, no hepatomegaly Ext: no C/C/E, distal pulses intact and equal Skin: warm/dry no rash   Lab Results:  Recent Labs  09/03/16 0526 09/04/16 0514  WBC 4.7 4.8  HGB 9.0* 8.7*  PLT 133* 137*    Recent Labs  09/02/16 0844 09/04/16 0514  NA 139 141  K 4.3 4.3  CL 110 111  CO2 23 26  GLUCOSE 162* 158*  BUN 18 13  CREATININE 1.00 1.12   No results for input(s): TROPONINI in the last 72 hours.  Invalid input(s): CK, MB  Tele: Sinus rhythm/sinus brady  Assessment/Plan:  1.Severe symptomatic aortic stenosis, stage D 2. Acute on chronic lower GI bleeding - HgB slightly down but essentially stable 3. Type 2 diabetes 4. Hypertension  The patient appears stable for hospital discharge. Will keep him off of aspirin. He will resume iron twice daily as he was previously doing at home. He will return Monday for preoperative labs and if his hemoglobin is stable we will plan to proceed with TAVR next week. The patient understands to notify his physicians if there is any more bleeding in his stool and he also understands to seek immediate medical attention if he has any major bleeding, lightheadedness, or  other significant change in symptoms.    Sherren Mocha, M.D. 09/04/2016, 7:46 AM

## 2016-09-05 ENCOUNTER — Other Ambulatory Visit (HOSPITAL_COMMUNITY): Payer: Self-pay | Admitting: *Deleted

## 2016-09-06 LAB — TYPE AND SCREEN
ABO/RH(D): O NEG
ANTIBODY SCREEN: POSITIVE
DAT, IgG: NEGATIVE
UNIT DIVISION: 0
Unit division: 0
Unit division: 0
Unit division: 0
Unit division: 0

## 2016-09-08 ENCOUNTER — Encounter (HOSPITAL_COMMUNITY): Payer: Self-pay

## 2016-09-08 ENCOUNTER — Encounter (HOSPITAL_COMMUNITY)
Admit: 2016-09-08 | Discharge: 2016-09-08 | Disposition: A | Discharge status: Home / Self Care | Payer: Medicare Other | Attending: Cardiovascular Disease | Admitting: Cardiovascular Disease

## 2016-09-08 DIAGNOSIS — Z7984 Long term (current) use of oral hypoglycemic drugs: Secondary | ICD-10-CM | POA: Diagnosis not present

## 2016-09-08 DIAGNOSIS — I10 Essential (primary) hypertension: Secondary | ICD-10-CM | POA: Diagnosis not present

## 2016-09-08 DIAGNOSIS — D696 Thrombocytopenia, unspecified: Secondary | ICD-10-CM | POA: Diagnosis not present

## 2016-09-08 DIAGNOSIS — Q231 Congenital insufficiency of aortic valve: Secondary | ICD-10-CM | POA: Diagnosis not present

## 2016-09-08 DIAGNOSIS — Z7982 Long term (current) use of aspirin: Secondary | ICD-10-CM | POA: Diagnosis not present

## 2016-09-08 DIAGNOSIS — E785 Hyperlipidemia, unspecified: Secondary | ICD-10-CM | POA: Diagnosis not present

## 2016-09-08 DIAGNOSIS — I350 Nonrheumatic aortic (valve) stenosis: Secondary | ICD-10-CM | POA: Diagnosis not present

## 2016-09-08 DIAGNOSIS — Z79899 Other long term (current) drug therapy: Secondary | ICD-10-CM | POA: Diagnosis not present

## 2016-09-08 DIAGNOSIS — D500 Iron deficiency anemia secondary to blood loss (chronic): Secondary | ICD-10-CM | POA: Diagnosis not present

## 2016-09-08 DIAGNOSIS — Z006 Encounter for examination for normal comparison and control in clinical research program: Secondary | ICD-10-CM | POA: Diagnosis not present

## 2016-09-08 DIAGNOSIS — Z01812 Encounter for preprocedural laboratory examination: Secondary | ICD-10-CM

## 2016-09-08 DIAGNOSIS — E119 Type 2 diabetes mellitus without complications: Secondary | ICD-10-CM | POA: Diagnosis not present

## 2016-09-08 DIAGNOSIS — I2510 Atherosclerotic heart disease of native coronary artery without angina pectoris: Secondary | ICD-10-CM | POA: Diagnosis not present

## 2016-09-08 HISTORY — DX: Anemia, unspecified: D64.9

## 2016-09-08 HISTORY — DX: Nonrheumatic aortic (valve) stenosis: I35.0

## 2016-09-08 HISTORY — DX: Personal history of other medical treatment: Z92.89

## 2016-09-08 HISTORY — DX: Unspecified osteoarthritis, unspecified site: M19.90

## 2016-09-08 HISTORY — DX: Gastrointestinal hemorrhage, unspecified: K92.2

## 2016-09-08 LAB — COMPREHENSIVE METABOLIC PANEL
ALT: 15 U/L — ABNORMAL LOW (ref 17–63)
ANION GAP: 7 (ref 5–15)
AST: 18 U/L (ref 15–41)
Albumin: 3.7 g/dL (ref 3.5–5.0)
Alkaline Phosphatase: 54 U/L (ref 38–126)
BUN: 13 mg/dL (ref 6–20)
CHLORIDE: 106 mmol/L (ref 101–111)
CO2: 25 mmol/L (ref 22–32)
Calcium: 9.4 mg/dL (ref 8.9–10.3)
Creatinine, Ser: 1.19 mg/dL (ref 0.61–1.24)
GFR calc non Af Amer: 56 mL/min — ABNORMAL LOW (ref >60)
Glucose, Bld: 205 mg/dL — ABNORMAL HIGH (ref 65–99)
POTASSIUM: 4.1 mmol/L (ref 3.5–5.1)
Sodium: 138 mmol/L (ref 135–145)
Total Bilirubin: 1.4 mg/dL — ABNORMAL HIGH (ref 0.3–1.2)
Total Protein: 6.3 g/dL — ABNORMAL LOW (ref 6.5–8.1)

## 2016-09-08 LAB — CBC
HCT: 30.5 % — ABNORMAL LOW (ref 39.0–52.0)
Hemoglobin: 9.9 g/dL — ABNORMAL LOW (ref 13.0–17.0)
MCH: 30.1 pg (ref 26.0–34.0)
MCHC: 32.5 g/dL (ref 30.0–36.0)
MCV: 92.7 fL (ref 78.0–100.0)
PLATELETS: 151 10*3/uL (ref 150–400)
RBC: 3.29 MIL/uL — AB (ref 4.22–5.81)
RDW: 15.8 % — ABNORMAL HIGH (ref 11.5–15.5)
WBC: 4.7 10*3/uL (ref 4.0–10.5)

## 2016-09-08 LAB — TYPE AND SCREEN
ABO/RH(D): O NEG
Antibody Screen: POSITIVE
PT AG TYPE: NEGATIVE

## 2016-09-08 LAB — BLOOD GAS, ARTERIAL
ACID-BASE DEFICIT: 0.1 mmol/L (ref 0.0–2.0)
Bicarbonate: 23.6 mmol/L (ref 20.0–28.0)
Drawn by: 449841
FIO2: 21
O2 SAT: 96.2 %
PCO2 ART: 35.8 mmHg (ref 32.0–48.0)
PO2 ART: 78.5 mmHg — AB (ref 83.0–108.0)
Patient temperature: 98.6
pH, Arterial: 7.434 (ref 7.350–7.450)

## 2016-09-08 LAB — URINALYSIS, ROUTINE W REFLEX MICROSCOPIC
Bilirubin Urine: NEGATIVE
Glucose, UA: NEGATIVE mg/dL
HGB URINE DIPSTICK: NEGATIVE
Ketones, ur: NEGATIVE mg/dL
LEUKOCYTES UA: NEGATIVE
Nitrite: NEGATIVE
PROTEIN: NEGATIVE mg/dL
Specific Gravity, Urine: 1.011 (ref 1.005–1.030)
pH: 5 (ref 5.0–8.0)

## 2016-09-08 LAB — APTT: APTT: 27 s (ref 24–36)

## 2016-09-08 LAB — PROTIME-INR
INR: 1.07
Prothrombin Time: 13.9 seconds (ref 11.4–15.2)

## 2016-09-08 LAB — GLUCOSE, CAPILLARY: Glucose-Capillary: 232 mg/dL — ABNORMAL HIGH (ref 65–99)

## 2016-09-08 MED ORDER — NITROGLYCERIN IN D5W 200-5 MCG/ML-% IV SOLN
2.0000 ug/min | INTRAVENOUS | Status: DC
  Filled 2016-09-08: qty 250

## 2016-09-08 MED ORDER — SODIUM CHLORIDE 0.9 % IV SOLN
30.0000 ug/min | INTRAVENOUS | Status: DC
  Filled 2016-09-08: qty 2

## 2016-09-08 MED ORDER — VANCOMYCIN HCL 10 G IV SOLR
1250.0000 mg | INTRAVENOUS | Status: DC
  Filled 2016-09-08: qty 1250

## 2016-09-08 MED ORDER — DEXTROSE 5 % IV SOLN
1.5000 g | INTRAVENOUS | Status: AC
  Administered 2016-09-09: 1.5 g via INTRAVENOUS
  Filled 2016-09-08: qty 1.5

## 2016-09-08 MED ORDER — SODIUM CHLORIDE 0.9 % IV SOLN
INTRAVENOUS | Status: DC
  Administered 2016-09-09: 09:00:00 via INTRAVENOUS

## 2016-09-08 MED ORDER — SODIUM CHLORIDE 0.9 % IV SOLN
INTRAVENOUS | Status: DC
  Filled 2016-09-08: qty 2.5

## 2016-09-08 MED ORDER — DEXMEDETOMIDINE HCL IN NACL 400 MCG/100ML IV SOLN
0.1000 ug/kg/h | INTRAVENOUS | Status: AC
  Administered 2016-09-09: .3 ug/kg/h via INTRAVENOUS
  Filled 2016-09-08: qty 100

## 2016-09-08 MED ORDER — SODIUM CHLORIDE 0.9 % IV SOLN
INTRAVENOUS | Status: DC
  Filled 2016-09-08: qty 30

## 2016-09-08 MED ORDER — VANCOMYCIN HCL 10 G IV SOLR
1500.0000 mg | INTRAVENOUS | Status: AC
  Administered 2016-09-09: 1500 mg via INTRAVENOUS
  Filled 2016-09-08: qty 1500

## 2016-09-08 MED ORDER — NOREPINEPHRINE BITARTRATE 1 MG/ML IV SOLN
0.0000 ug/min | INTRAVENOUS | Status: AC
  Administered 2016-09-09: 4 ug/min via INTRAVENOUS
  Filled 2016-09-08: qty 4

## 2016-09-08 MED ORDER — CHLORHEXIDINE GLUCONATE 0.12 % MT SOLN
15.0000 mL | Freq: Once | OROMUCOSAL | Status: AC
  Administered 2016-09-09: 15 mL via OROMUCOSAL
  Filled 2016-09-08: qty 15

## 2016-09-08 MED ORDER — POTASSIUM CHLORIDE 2 MEQ/ML IV SOLN
80.0000 meq | INTRAVENOUS | Status: DC
  Filled 2016-09-08: qty 40

## 2016-09-08 MED ORDER — DEXTROSE 5 % IV SOLN
0.0000 ug/min | INTRAVENOUS | Status: DC
  Filled 2016-09-08: qty 4

## 2016-09-08 MED ORDER — DOPAMINE-DEXTROSE 3.2-5 MG/ML-% IV SOLN
0.0000 ug/kg/min | INTRAVENOUS | Status: DC
  Filled 2016-09-08: qty 250

## 2016-09-08 MED ORDER — MAGNESIUM SULFATE 50 % IJ SOLN
40.0000 meq | INTRAMUSCULAR | Status: DC
  Filled 2016-09-08: qty 10

## 2016-09-08 NOTE — H&P (Signed)
[address]       [place] [address]             [phone]      Cardiothoracic Surgery Admission History and Physical   Referring Provider is Adrian Prows, MD PCP is Jani Gravel, MD      Chief Complaint  Patient presents with  . Aortic Stenosis        HPI:  The patient is an 83 year old gentleman with hypertension, diabetes, hyperlipidemia, and moderate aortic stenosis followed by echo. In April 2017 he developed severe anemia with lower GI bleeding and colonoscopy found internal and external hemorrhoids as well as several large AVM's in the ascending colon and cecum that were not bleeding. UGI was negative and it was felt that the anemia was likely secondary to chronic disease and hemorrhoids. He did require transfusion. He reports that he has felt more fatigued over the past six months when he plays golf. He has shortness of breath with exertion. He denies any , orthopnea or PND. He denies chest pain and dizziness. He underwent a repeat echo on 06/11/2016 showing moderate concentric LVH with an EF of 58%. The aortic valve was reportedly moderately calcified and severe leaflet restriction and the mean AV gradient was 41 mm Hg with a peak of 68 mm Hg. The calculated AVA was 0.8 cm2. The mean gradient on echo in 11/2015 was 31 mm Hg. He subsequently underwent a TEE on 07/29/2016 showing a mean gradient of 49 mm Hg with a peak of 71 mm Hg and an EF of 55-60%. There was mild atheromatous plaque throughout the arch and proximal descending aorta. He underwent cath on 08/13/2015 showing 60% mid RCA stenosis that was unchanged from cath in 2007.   The patient was scheduled for TAVR on 09/03/2015 but his preop Hgb on 08/29/2016 was 8.5. His last Hgb in the [facility] system was on 12/11/2015 and was 12.4. We check his Hgb on the morning of surgery and it was 5.4. He related that the night before surgery he had passed a large amount of blood per rectum and felt weak and dizzy.  Surgery was canceled and he was admitted for transfusion and further workup. He was seen by his gastroenterologist, Dr. Paulita Fujita, who felt that we should wait until after TAVR to proceed with a colonoscopy as long as the patient had no further bleeding.      Past Medical History:  Diagnosis Date  . Hypertension          Past Surgical History:  Procedure Laterality Date  . TEE WITHOUT CARDIOVERSION N/A 07/29/2016   Procedure: TRANSESOPHAGEAL ECHOCARDIOGRAM (TEE); Surgeon: Adrian Prows, MD; Location: [facility]; Service: Cardiovascular; Laterality: N/A;    No family history on file.  Social History        Social History  . Marital status: Single    Spouse name: N/A  . Number of children: N/A  . Years of education: N/A      Occupational History  . Not on file.       Social History Main Topics  . Smoking status: Never Smoker  . Smokeless tobacco: Never Used  . Alcohol use Yes  . Drug use: No  . Sexual activity: Not on file       Other Topics Concern  . Not on file      Social History Narrative  . No narrative on file  Current Outpatient Prescriptions  Medication Sig Dispense Refill  . aspirin EC 81 MG tablet Take 81 mg by mouth every evening.     . Cyanocobalamin (VITAMIN B-12 PO) Take 1 tablet by mouth daily.    Marland Kitchen FeFum-FePoly-FA-B Cmp-C-Biot (FOLIVANE-PLUS PO) Take 1 tablet by mouth 2 (two) times daily.     Marland Kitchen lisinopril (PRINIVIL,ZESTRIL) 40 MG tablet Take 40 mg by mouth daily.    Marland Kitchen lovastatin (MEVACOR) 40 MG tablet Take 40 mg by mouth 2 (two) times a week. Mon and Tues    . metFORMIN (GLUCOPHAGE) 500 MG tablet Take 500 mg by mouth 2 (two) times daily with a meal.     . Multiple Vitamin (MULTIVITAMIN WITH MINERALS) TABS tablet Take 1 tablet by mouth daily.    . Multiple Vitamins-Minerals (VISION VITAMINS PO) Take 1 tablet by mouth daily.    . sildenafil (VIAGRA) 100 MG tablet Take 100 mg by mouth  daily as needed for erectile dysfunction.     No current facility-administered medications for this visit.     No Known Allergies    Review of Systems:  General:normalappetite, decreasedenergy, noweight gain, noweight loss, nofever Cardiac:nochest pain with exertion, nochest pain at rest, moderateSOB with exertion, noresting SOB, noPND, noorthopnea, nopalpitations, noarrhythmia, noatrial fibrillation, noLE edema, nodizzy spells, nosyncope Respiratory:exertionalshortness of breath, nohome oxygen, noproductive cough, nodry cough, nobronchitis, nowheezing, nohemoptysis, noasthma, nopain with inspiration or cough, nosleep apnea, noCPAP at night [id number] swallowing, noreflux, nofrequent heartburn, nohiatal hernia, noabdominal pain, noconstipation, nodiarrhea, nohematochezia, nohematemesis, nomelena, Has BRBPR. [id number], nofrequency, hx ofurinary tract infection, nohematuria, noenlarged prostate, nokidney stones, nokidney disease Vascular:nopain suggestive of claudication, nopain in feet, nighttimeleg cramps, novaricose veins, noDVT, nonon-healing foot ulcer Neuro:nostroke, noTIA's, noseizures, noheadaches, notemporary blindness one eye, noslurred speech, noperipheral neuropathy, nochronic pain, noinstability of gait, nomemory/cognitive dysfunction Musculoskeletal:noarthritis, nojoint swelling, nomyalgias, nodifficulty walking, nomobility  Skin:norash, noitching, noskin infections, nopressure sores or ulcerations Psych:noanxiety, nodepression, nonervousness,  nounusual recent stress Eyes:noblurry vision, nofloaters, hasrecent vision changes, wears glasses  [id number] loss, noloose or painful teeth, nodentures, last saw dentist 07/2016 Hematologic:haseasy bruising, noabnormal bleeding, noclotting disorder, nofrequent epistaxis Endocrine:hasdiabetes, does check CBG's at home     Physical Exam:  BP (!) 148/74 (BP Location: Left Arm, Patient Position: Sitting, Cuff Size: Large)  Pulse (!) 55  Resp 16  Ht 5\' 10"  (1.778 m)  Wt 200 lb (90.7 kg)  SpO2 99% Comment: ON RA  BMI 28.70 kg/m  General:Elderly butwell-appearing HEENT:Unremarkable, NCAT, PERLA, EOMI, oropharynx clear Neck:no JVD, no bruits, no adenopathy or thyromegaly Chest:clear to auscultation, symmetrical breath sounds, no wheezes, no rhonchi  CV:RRR, grade III/VI crescendo/decrescendo murmur heard best at RSB, no diastolic murmur Abdomen:soft, non-tender, no masses or organomegaly Extremities:warm, well-perfused, pulses palpable in feet, no LE edema Rectal/GUDeferred Neuro:Grossly non-focal and symmetrical throughout Skin:Clean and dry, no rashes, no breakdown   Diagnostic Tests:  *Solomon* *[facility]* [address], [place]  [address] [phone]  ------------------------------------------------------------------- Transesophageal Echocardiography  Patient: Traivon, [place] MR #: [id number] Study Date: 07/29/2016 Gender: M Age: 63 Height: 177.8 cm Weight: 93 kg BSA: 2.17 m^2 Pt. Status: Room:  ATTENDING Adrian Prows, MD PERFORMING Adrian Prows, MD SONOGRAPHER Florentina Jenny, RDCS ORDERING Kela Millin R  cc:  ------------------------------------------------------------------- LV EF: 55% - 60%  ------------------------------------------------------------------- Indications: Aortic stenosis 424.1.  ------------------------------------------------------------------- History: Risk factors: Hypertension.  ------------------------------------------------------------------- Study Conclusions  - Left ventricle: There was mild concentric hypertrophy. Systolic function was normal. The estimated ejection fraction was in the range of 55% to  60%. - Aortic valve: Mildly calcified annulus. Trileaflet; severely calcified leaflets. There was moderate to severe stenosis. Mean gradient (S): 49 mm Hg. Peak gradient (S): 71 mm Hg. Valve area (VTI): 1.23 cm^2. Valve area (Vmax): 1.4 cm^2. Valve area (Vmean): 1.24 cm^2. LVOT measures 2.5 cm. - Aorta: There was mild atheromatous plaque. There was a localized ectatic segment, in the proximal ascending aorta. and measures 3.9 cm. Sino tubular junctino is normal in size. - Mitral valve: No evidence of vegetation. There was mild regurgitation. - Left atrium: The atrium was dilated. - Right atrium: No evidence of thrombus in the atrial cavity or appendage. - Atrial septum: No defect or patent foramen ovale was identified by color Doppler exam.  ------------------------------------------------------------------- Study data: Study status: Routine.  Consent: The risks, benefits, and alternatives to the procedure were explained to the patient and informed consent was obtained. Procedure: The patient reported no pain pre or post test. Initial setup. The patient was brought to the laboratory. Surface ECG leads were monitored. Sedation. Conscious sedation was administered by cardiology staff. Transesophageal echocardiography. An adult multiplane transesophageal probe was inserted by the attending cardiologistwithout difficulty. Image quality was adequate. Study completion: The patient tolerated the procedure well. There were no complications. Administered medications: Midazolam, 2mg , IV. Fentanyl, 5mcg, IV. Diagnostic transesophageal echocardiography. 2D and color Doppler. Birthdate: Patient birthdate: [birth year]. Age: Patient is 83 yr old. Sex: Gender: male. BMI: 29.4 kg/m^2. Blood pressure: 158/68 Patient status: Outpatient. Study date: Study date: 07/29/2016. Study time: 12:13 PM. Location: Endoscopy.  -------------------------------------------------------------------  ------------------------------------------------------------------- Left ventricle: There was mild concentric hypertrophy. Systolic function was normal. The estimated ejection fraction was in the range of 55% to 60%.  ------------------------------------------------------------------- Aortic valve: Mildly calcified annulus. Trileaflet; severely calcified leaflets. Doppler: There was moderate to severe stenosis. Doppler assessment for regurgitation was not performed. VTI ratio of LVOT to aortic valve: 0.25. Valve area (VTI): 1.23 cm^2. Indexed valve area (VTI): 0.57 cm^2/m^2. Peak velocity ratio of LVOT to aortic valve: 0.29. Valve area (Vmax): 1.4 cm^2. Indexed valve area (Vmax): 0.65 cm^2/m^2. Mean velocity ratio of LVOT to aortic valve: 0.25. Valve area (Vmean): 1.24 cm^2. Indexed valve area (Vmean): 0.57  cm^2/m^2. Mean gradient (S): 49 mm Hg. Peak gradient (S): 71 mm Hg.  ------------------------------------------------------------------- Aorta: There was mild atheromatous plaque. There was a localized ectatic segment, in the proximal ascending aorta. and measures 3.9 cm. Sino tubular junctino is normal in size.  ------------------------------------------------------------------- Mitral valve: Structurally normal valve. Leaflet separation was normal. No evidence of vegetation. Doppler: There was mild regurgitation.  ------------------------------------------------------------------- Left atrium: The atrium was dilated. The appendage was morphologically a left appendage.  ------------------------------------------------------------------- Atrial septum: No defect or patent foramen ovale was identified by color Doppler exam.  ------------------------------------------------------------------- Right ventricle: The cavity size was normal. Wall thickness was normal. Systolic function was normal.  ------------------------------------------------------------------- Pulmonic valve: Structurally normal valve. Cusp separation was normal. Doppler: There was trivial regurgitation.  ------------------------------------------------------------------- Tricuspid valve: Structurally normal valve. Leaflet separation was normal. Doppler: There was trivial regurgitation.  ------------------------------------------------------------------- Right atrium: The atrium was normal in size. No evidence of thrombus in the atrial cavity or appendage.  ------------------------------------------------------------------- Pericardium: The pericardium was normal in appearance. There was no pericardial effusion.  ------------------------------------------------------------------- Measurements  Left ventricle Value Stroke volume, 2D  141 ml Stroke volume/bsa, 2D 65 ml/m^2  LVOT Value LVOT ID, S 25 mm LVOT area 4.91 cm^2 LVOT peak velocity, S 120 cm/s LVOT mean velocity, S 84.1 cm/s LVOT VTI, S 28.7 cm LVOT peak gradient, S 6  mm Hg  Aortic valve Value Aortic valve peak velocity, S 421 cm/s Aortic valve mean velocity, S 334 cm/s Aortic valve VTI, S 115 cm Aortic mean gradient, S 49 mm Hg Aortic peak gradient, S 71 mm Hg VTI ratio, LVOT/AV 0.25 Aortic valve area, VTI 1.23 cm^2 Aortic valve area/bsa, VTI 0.57 cm^2/m^2 Velocity ratio, peak, LVOT/AV 0.29 Aortic valve area, peak velocity 1.4 cm^2 Aortic valve area/bsa, peak velocity 0.65 cm^2/m^2 Velocity ratio, mean, LVOT/AV 0.25 Aortic valve area, mean velocity 1.24 cm^2 Aortic valve area/bsa, mean velocity 0.57 cm^2/m^2  Legend: (L) and (H) mark values outside specified reference range.  ------------------------------------------------------------------- Prepared and Electronically Authenticated by  Adrian Prows, MD 2017-12-19T18:38:12   [name]  Cardiac catheterization  Order# [id number]  Reading physician: Adrian Prows, MD Ordering physician: Adrian Prows, MD Study date: 08/12/16  Physicians   Panel Physicians Referring Physician Case Authorizing Physician  Adrian Prows, MD (Primary)    Procedures   Right Heart Cath and Coronary Angiography  Conclusion   1. LV gram not performed due to inability to cross the calcified aortic valve. 2. Heavy calcification of the  aortic valve annulus and mitral valve annular calcification. 3. Mild coronary calcification without high-grade stenosis in the coronary arteries involving the left system. Mid RCA has a smooth 60% stenosis, unchanged from 2007 angiography. 4. Smooth ascending, arch and descending thoracic aorta without aneurysm formation. 5. Right heart catheterization revealing mild pulmonary hypertension, preserved chronic output and cardiac index.  Recommendation: Patient will be evaluated for aortic valve replacement. He has an appointment with Dr. Ricki Miller tomorrow. A total of 80 mL contrast utilized.  Procedural Details/Technique   Technical Details Procedure performed:  Left heart catheterization including ascending aortogram, selective right and left coronary arteriography. Right heart catheterization and cannulation of cardiac output and cardiac index by Fick.  Indication: LORIK BOYEA is a 83 y.o. male with with history of hypertension, hyperlipidemia, Patient with known moderate coronary artery disease involving the RCA by: Angiography in 2007 with a 60% stenosis. At recent TEE has confirmed severe aortic stenosis by mean pressure gradient criteria of 49 mmHg, peak gradient 71 mmHg, but continuity aortic valve area was 1.23 cm. He has been complaining of new onset of shortness of breath and dyspnea on exertion. Hence now brought for right and left heart catheterization and for possible need for aortic valve replacement.  Right AC vein access for right heart and Right radial access for left heart catheterization was utilized for performing the procedure.   Technique:   A 5 French sheath introduced into right right AC vein for access under fluroscopic guidance. A 5 French Swan-Ganz catheter was advanced with balloon inflated under fluoroscopic guidance into first the right atrium followed by the right ventricle and into the pulmonary artery to pulmonary artery wedge position. Hemodynamics were obtained in  a locations.  After hemodynamics were completed, samples were taken for SaO2% measurement to be used in Fick Calculation of cardiac output and cardiac index. The catheter was then pulled back the balloon down and then completely out of the body. Hemostasis obtained by manual pressure over the access site.  Under sterile precautions using a 6 French right radial arterial access, a 6 French sheath was introduced into the right radial artery. A 5 [place] Tig 4 catheter was advanced into the ascending aorta selective right coronary artery and left coronary artery was cannulated and angiography was performed in multiple views. The catheter was pulled back Out of the body over exchange  length J-wire.   I was unable to perform LV gram due to inability to cross the aortic valve due to heavy calcification and catheter bounce and shipping probably due to aortic stenosis.   A pigtail catheter was utilized to perform ascending aortogram. Catheter exchanged out of the body over J-Wire. NO immediate complications noted. Patient tolerated the procedure well.   Estimated blood loss <50 mL.  During this procedure the patient was administered the following to achieve and maintain moderate conscious sedation: Versed 2 mg, Dilaudid 0.5 mg, while the patient's heart rate, blood pressure, and oxygen saturation were continuously monitored. The period of conscious sedation was 35 minutes, of which I was present face-to-face 100% of this time.    Coronary Findings   Dominance: Right  Left Main  Ost LM to LM lesion, 5% stenosed. The lesion is calcified.  Right Coronary Artery  Prox RCA to Mid RCA lesion, 60% stenosed. The lesion is smooth. No change from 2007  Right Heart   Right Heart Pressures RA 11/8, mean 6 mmHg RV 40/2, EDP 10 mmHg PA 37/13, mean 22 mmHg. PA saturation 63%. PW 16/11, mean 10 mmHg. Aortic saturation 95%. Cardiac output 5.81, cardiac index 2.78, normal.    Left Heart   Mitral Valve The  annulus is calcified.    Aortic Valve The aortic valve is calcified.    Coronary Diagrams   Diagnostic Diagram     Implants     No implant documentation for this case.  PACS Images   Show images for Cardiac catheterization   Link to Procedure Log   Procedure Log    Hemo Data   Flowsheet Row Most Recent Value  Fick Cardiac Output 5.81 L/min  Fick Cardiac Output Index 2.78 (L/min)/BSA  RA A Wave 11 mmHg  RA V Wave 8 mmHg  RA Mean 6 mmHg  RV Systolic Pressure 40 mmHg  RV Diastolic Pressure 2 mmHg  RV EDP 10 mmHg  PA Systolic Pressure 37 mmHg  PA Diastolic Pressure 13 mmHg  PA Mean 22 mmHg  PW A Wave 16 mmHg  PW V Wave 11 mmHg  PW Mean 10 mmHg  AO Systolic Pressure 123456 mmHg  AO Diastolic Pressure 68 mmHg  AO Mean 95 mmHg  QP/QS 1  TPVR Index 7.91 HRUI  TSVR Index 34.19 HRUI  PVR SVR Ratio 0.13  TPVR/TSVR Ratio 0.23   ADDENDUM REPORT: 08/25/2016 18:18  CLINICAL DATA: 83 year old male with severe aortic stenosis.  EXAM: Cardiac TAVR CT  TECHNIQUE: The patient was scanned on a Philips 256 scanner. A 120 kV retrospective scan was triggered in the descending thoracic aorta at 111 HU's. Gantry rotation speed was 270 msecs and collimation was .9 mm. No beta blockade or nitro were given. The 3D data set was reconstructed in 5% intervals of the R-R cycle. Systolic and diastolic phases were analyzed on a dedicated work station using MPR, MIP and VRT modes. The patient received 80 cc of contrast.  FINDINGS: Aortic Valve: Functionally bicuspid aortic valve with non-separated left and right coronary cusp. There is severe calcification and thickening of the leaflets with almost no calcium extending into the LVOT.  Aorta: Mild dilatation of the sinotubular junction and upper normal size of the ascending aorta. Mild diffuse calcifications of the aortic arch and descending aorta.  Sinotubular Junction: 35 x 34 mm  Ascending Thoracic Aorta:  40 x 40 mm  Aortic Arch: 31 x 30 mm  Descending Thoracic Aorta: 30 x 29 mm  Sinus  of Valsalva Measurements:  Non-coronary: 39 mm  Right -coronary: 38 mm  Left -coronary: 39 mm  Coronary Artery Height above Annulus:  Left Main: 14 mm  Right Coronary: 13 mm  Virtual Basal Annulus Measurements:  Maximum/Minimum Diameter: 32 x 26 mm  Perimeter: 94 mm  Area: 666 mm2  Coronary Arteries: Normal origin. Right dominance.  Optimum Fluoroscopic Angle for Delivery: RAO 2 CRA 1  IMPRESSION: 1. Functionally bicuspid aortic valve with non-separated left and right coronary cusp. There is severe calcification and thickening of the leaflets with almost no calcium extending into the LVOT.  Annular measurements are at upper limit for the 29 mm Edwards-SAPIEN 3 valve.  2. Mild dilatation of the sinotubular junction and upper normal size of the ascending aorta measuring 40 mm.  3. Sufficient annulus to coronary distance.  4. Optimum Fluoroscopic Angle for Delivery: RAO 2 CRA 1  Ena Dawley   Electronically Signed By: Ena Dawley On: 08/25/2016 18:18   CT ANGIO CHEST AORTA W/CM &/OR WO/CM (Accession [id number]) (Order [id number])  Imaging  Date: 08/25/2016 Department: [facility] CT IMAGING Released By: Selmer Dominion Authorizing: Gaye Pollack, MD  Exam Information   Status Exam Begun  Exam Ended   Final [99] 08/25/2016 10:47 AM 08/25/2016 11:33 AM  PACS Images   Show images for CT ANGIO CHEST AORTA W/CM &/OR WO/CM  Study Result   CLINICAL DATA: 83 year old male with history of severe aortic stenosis. Preprocedural study prior to potential transcatheter aortic valve replacement (TAVR) procedure.  EXAM: CT ANGIOGRAPHY CHEST, ABDOMEN AND PELVIS  TECHNIQUE: Multidetector CT imaging through the chest, abdomen and pelvis was performed using the standard protocol during bolus administration  of intravenous contrast. Multiplanar reconstructed images and MIPs were obtained and reviewed to evaluate the vascular anatomy.  CONTRAST: 67 mL of Isovue 370.  COMPARISON: CT the abdomen and pelvis 08/24/2009.  FINDINGS: CTA CHEST FINDINGS  Cardiovascular: Heart size is normal. There is no significant pericardial fluid, thickening or pericardial calcification. There is aortic atherosclerosis, as well as atherosclerosis of the great vessels of the mediastinum and the coronary arteries, including calcified atherosclerotic plaque in the left main, left anterior descending, left circumflex and right coronary arteries. Severe thickening calcifications of the aortic valve.  Mediastinum/Lymph Nodes: No pathologically enlarged mediastinal or hilar lymph nodes. Esophagus is unremarkable in appearance. No axillary lymphadenopathy.  Lungs/Pleura: There are several scattered 2-3 mm pulmonary nodules noted throughout the lungs bilaterally, highly nonspecific, but statistically likely benign. No other larger more suspicious appearing pulmonary nodules or masses are noted. No acute consolidative airspace disease. No pleural effusions.  Musculoskeletal/Soft Tissues: There are no aggressive appearing lytic or blastic lesions noted in the visualized portions of the skeleton.  CTA ABDOMEN AND PELVIS FINDINGS  Hepatobiliary: No suspicious cystic or solid hepatic lesions. No intra or extrahepatic biliary ductal dilatation. Small calcified gallstone measuring 5 mm lying dependently in the gallbladder. No findings to suggest an acute cholecystitis at this time.  Pancreas: No pancreatic mass. No pancreatic ductal dilatation. No pancreatic or peripancreatic fluid or inflammatory changes.  Spleen: Unremarkable.  Adrenals/Urinary Tract: Subcentimeter low-attenuation lesions in the lower poles of both kidneys are too small to definitively characterize, but are statistically likely  tiny cysts. No suspicious renal lesions. No hydroureteronephrosis. Bilateral adrenal glands are normal in appearance. Urinary bladder is thickened and heavily trabeculated with multiple small bladder diverticulae.  Stomach/Bowel: The appearance of the stomach is normal. There is no pathologic dilatation of small bowel or colon.  Normal appendix.  Vascular/Lymphatic: Aortic atherosclerosis, with vascular findings and measurements pertinent to potential TAVR procedure, as detailed below. No aneurysm or dissection noted in the abdominal or pelvic vasculature. Celiac axis, superior mesenteric artery and inferior mesenteric artery are all widely patent without definite hemodynamically significant stenosis. Two left-sided and 1 right-sided renal arteries are all widely patent. No lymphadenopathy noted in the abdomen or pelvis.  Reproductive: Prostate gland is markedly enlarged with median lobe hypertrophy, measuring 7.4 x 6.6 x 7.8 cm. Seminal vesicles are unremarkable in appearance.  Other: No significant volume of ascites. No pneumoperitoneum.  Musculoskeletal: There are no aggressive appearing lytic or blastic lesions noted in the visualized portions of the skeleton.  VASCULAR MEASUREMENTS PERTINENT TO TAVR:  AORTA:  Minimal Aortic Diameter - 17 x 17 mm  Severity of Aortic Calcification - moderate to severe  RIGHT PELVIS:  Right Common Iliac Artery -  Minimal Diameter - 10.6 x 8.4 mm  Tortuosity - moderate  Calcification - mild to moderate  Right External Iliac Artery -  Minimal Diameter - 9.1 x 7.2 mm  Tortuosity - moderate to severe  Calcification - mild  Right Common Femoral Artery -  Minimal Diameter - 8.5 x 6.6 mm  Tortuosity - mild  Calcification - mild  LEFT PELVIS:  Left Common Iliac Artery -  Minimal Diameter - 7.4 x 6.8 mm  Tortuosity - mild  Calcification - mild to moderate  Left External Iliac Artery  -  Minimal Diameter - 8.3 x 8.3 mm  Tortuosity - moderate to severe  Calcification - none  Left Common Femoral Artery -  Minimal Diameter - 9.5 x 9.4 mm  Tortuosity - mild  Calcification - minimal  Review of the MIP images confirms the above findings.  IMPRESSION: 1. Vascular findings and measurements pertinent to potential TAVR procedure, as detailed above. This patient appears to have suitable pelvic arterial access bilaterally. 2. Severe thickening and calcification of the aortic valve, compatible with the reported clinical history of severe aortic stenosis. 3. Aortic atherosclerosis, in addition to left main and 3 vessel coronary artery disease. 4. Severely thickened and heavily trabeculated urinary bladder with multiple small bladder wall diverticulae, presumably from chronic bladder outlet obstruction related to the patient's enlarged prostate gland. 5. Multiple tiny 2-3 mm pulmonary nodules scattered throughout the lungs bilaterally, highly nonspecific but statistically likely benign. No follow-up needed if patient is low-risk (and has no known or suspected primary neoplasm). Non-contrast chest CT can be considered in 12 months if patient is high-risk. This recommendation follows the consensus statement: Guidelines for Management of Incidental Pulmonary Nodules Detected on CT Images: From the Fleischner Society 2017; Radiology 2017; 284:228-243. 6. Additional incidental findings, as above.   Electronically Signed By: Vinnie Langton M.D. On: 08/25/2016 12:38      RISK SCORES About the STS Risk Calculator Procedure: AV Replacement + CAB  Risk of Mortality: 2.51%  Morbidity or Mortality: 18.142%  Long Length of Stay: 9.28%  Short Length of Stay: 29.072%  Permanent Stroke: 1.734%  Prolonged Ventilation: 10.188%  DSW Infection: 0.397%  Renal Failure: 6.182%  Reoperation: 7.746%   Impression:  This 83 year old gentleman has stage  D, severe, symptomatic aortic stenosis with fatigue and shortness of breath with moderate exertion, NYHA class II. I have personally reviewed his 2D echo, TEE and cath films. He has a trileaflet aortic valve that is severely calcified with severe aortic stenosis and a mean gradient of 49 mm Hg by TEE and 41 mm  Hg by 2D echo. LV function is normal. He has a 60% mid RCA stenosis but has had no chest pain. I think aortic valve replacement is indicated in this patient with progressive symptoms and severe AS. He would be a candidate for open surgical AVR and CABG to the RCA but given his age he would like to avoid open surgery if possible. I think TAVR may be a reasonable alternative for him given his age. His RCA stenosis could be treated medically for now and has been stable for a long time. His STS PROM score puts him in the low risk group for open surgery but given his age I think recovery from open surgery would be longer and more difficult for him. He does have significant atheromatous plaque in his aortic arch and descending aorta on TEE which may increase his risk of stroke with aortic cannulation for open surgery. He has also had recent GI bleeding and will require further workup of that after valve replacement. Therefore I think TAVR is a better option for him.  I have personally reviewed his gated cardiac CT and CTA of the chest, abdomen and pelvis. He has a functionally bicuspid aortic valve with a raphe between the left and right cusps. There is severe calcification and thickening of the leaflets. The annular measurement of 666 mm2 appears adequate for a 29 mm Sapien 3 valve. His pelvic arterial anatomy is suitable for a transfemoral approach.   The patient and his wife were counseled at length regarding treatment alternatives for management of severe symptomatic aortic stenosis. Alternative approaches such as conventional aortic valve replacement, transcatheter aortic valve replacement, and palliative  medical therapy were compared and contrasted at length. The risks associated with conventional surgical aortic valve replacement were been discussed in detail, as were expectations for post-operative convalescence. Long-term prognosis with medical therapy was discussed.  We discussed complications that might develop including but not limited to risks of death, stroke, paravalvular leak, aortic dissection or other major vascular complications, aortic annulus rupture, device embolization, cardiac rupture or perforation, mitral regurgitation, acute myocardial infarction, arrhythmia, heart block or bradycardia requiring permanent pacemaker placement, congestive heart failure, respiratory failure, renal failure, pneumonia, infection, other late complications related to structural valve deterioration or migration, or other complications that might ultimately cause a temporary or permanent loss of functional independence or other long term morbidity. The patient provides full informed consent for the procedure as described and all questions were answered.    Plan:  Transfemoral TAVR using a 29 mm Sapien 3 valve.  Gaye Pollack, MD

## 2016-09-08 NOTE — Pre-Procedure Instructions (Signed)
THAYNE OKIN  09/08/2016      [facility], [place] [address] Phone: [phone] Fax: [phone]    Your procedure is scheduled on [id number]   Tuesday   Report to [facility] Admitting at 5:30  A.M.   Call this number if you have problems the morning of surgery:  [phone]   Remember:  Do not eat food or drink liquids after midnight.   Take these medicines the morning of surgery with A SIP OF WATER none              How to Manage Your Diabetes Before and After Surgery  Why is it important to control my blood sugar before and after surgery? . Improving blood sugar levels before and after surgery helps healing and can limit problems. . A way of improving blood sugar control is eating a healthy diet by: o  Eating less sugar and carbohydrates o  Increasing activity/exercise o  Talking with your doctor about reaching your blood sugar goals . High blood sugars (greater than 180 mg/dL) can raise your risk of infections and slow your recovery, so you will need to focus on controlling your diabetes during the weeks before surgery. . Make sure that the doctor who takes care of your diabetes knows about your planned surgery including the date and location.  How do I manage my blood sugar before surgery? . Check your blood sugar at least 4 times a day, starting 2 days before surgery, to make sure that the level is not too high or low. o Check your blood sugar the morning of your surgery when you wake up and every 2 hours until you get to the Short Stay unit. . If your blood sugar is less than 70 mg/dL, you will need to treat for low blood sugar: o Do not take insulin. o Treat a low blood sugar (less than 70 mg/dL) with  cup of clear juice (cranberry or apple), 4 glucose tablets, OR glucose gel. o Recheck blood sugar in 15 minutes after treatment (to make sure it is greater  than 70 mg/dL). If your blood sugar is not greater than 70 mg/dL on recheck, call [phone] for further instructions. . Report your blood sugar to the short stay nurse when you get to Short Stay.  . If you are admitted to the hospital after surgery: o Your blood sugar will be checked by the staff and you will probably be given insulin after surgery (instead of oral diabetes medicines) to make sure you have good blood sugar levels. o The goal for blood sugar control after surgery is 80-180 mg/dL.              WHAT DO I DO ABOUT MY DIABETES MEDICATION?   Marland Kitchen Do not take oral diabetes medicines (pills) the morning of surgery      Reviewed and Endorsed by [facility] Patient Education Committee, August 2015    Do not wear jewelry,   Do not wear lotions, powders, or perfumes, or deoderant.  Do not shave 48 hours prior to surgery.  Men may shave face and neck.   Do not bring valuables to the hospital.  [facility] is not responsible for any belongings or valuables.  Contacts, dentures or bridgework may not be worn into surgery.  Leave your suitcase in the car.  After surgery it may be brought to your  room.  For patients admitted to the hospital, discharge time will be determined by your treatment team.  Patients discharged the day of surgery will not be allowed to drive home.    Special instructions:  See attached Sheet for instructions on CHG showers

## 2016-09-09 ENCOUNTER — Inpatient Hospital Stay (HOSPITAL_COMMUNITY): Payer: Medicare Other

## 2016-09-09 ENCOUNTER — Other Ambulatory Visit: Payer: Self-pay

## 2016-09-09 ENCOUNTER — Inpatient Hospital Stay (HOSPITAL_COMMUNITY): Payer: Medicare Other | Admitting: Certified Registered"

## 2016-09-09 ENCOUNTER — Encounter (HOSPITAL_COMMUNITY)
Admission: RE | Disposition: A | Discharge status: Home / Self Care | Payer: Self-pay | Source: Ambulatory Visit | Attending: Cardiovascular Disease

## 2016-09-09 ENCOUNTER — Inpatient Hospital Stay (HOSPITAL_COMMUNITY)
Admission: RE | Admit: 2016-09-09 | Discharge: 2016-09-11 | DRG: 267 | Disposition: A | Discharge status: Home / Self Care | Payer: Medicare Other | Source: Ambulatory Visit | Attending: Cardiovascular Disease | Admitting: Cardiovascular Disease

## 2016-09-09 ENCOUNTER — Encounter (HOSPITAL_COMMUNITY): Payer: Self-pay | Admitting: *Deleted

## 2016-09-09 DIAGNOSIS — E785 Hyperlipidemia, unspecified: Secondary | ICD-10-CM | POA: Diagnosis present

## 2016-09-09 DIAGNOSIS — Z7984 Long term (current) use of oral hypoglycemic drugs: Secondary | ICD-10-CM

## 2016-09-09 DIAGNOSIS — R079 Chest pain, unspecified: Secondary | ICD-10-CM | POA: Diagnosis not present

## 2016-09-09 DIAGNOSIS — I10 Essential (primary) hypertension: Secondary | ICD-10-CM | POA: Diagnosis present

## 2016-09-09 DIAGNOSIS — Q231 Congenital insufficiency of aortic valve: Secondary | ICD-10-CM

## 2016-09-09 DIAGNOSIS — I350 Nonrheumatic aortic (valve) stenosis: Principal | ICD-10-CM

## 2016-09-09 DIAGNOSIS — Z954 Presence of other heart-valve replacement: Secondary | ICD-10-CM | POA: Diagnosis not present

## 2016-09-09 DIAGNOSIS — I5032 Chronic diastolic (congestive) heart failure: Secondary | ICD-10-CM | POA: Diagnosis not present

## 2016-09-09 DIAGNOSIS — Z952 Presence of prosthetic heart valve: Secondary | ICD-10-CM

## 2016-09-09 DIAGNOSIS — E119 Type 2 diabetes mellitus without complications: Secondary | ICD-10-CM | POA: Diagnosis not present

## 2016-09-09 DIAGNOSIS — D696 Thrombocytopenia, unspecified: Secondary | ICD-10-CM | POA: Diagnosis not present

## 2016-09-09 DIAGNOSIS — Z79899 Other long term (current) drug therapy: Secondary | ICD-10-CM | POA: Diagnosis not present

## 2016-09-09 DIAGNOSIS — Z006 Encounter for examination for normal comparison and control in clinical research program: Secondary | ICD-10-CM

## 2016-09-09 DIAGNOSIS — I2510 Atherosclerotic heart disease of native coronary artery without angina pectoris: Secondary | ICD-10-CM | POA: Diagnosis present

## 2016-09-09 DIAGNOSIS — Z7982 Long term (current) use of aspirin: Secondary | ICD-10-CM | POA: Diagnosis not present

## 2016-09-09 DIAGNOSIS — K922 Gastrointestinal hemorrhage, unspecified: Secondary | ICD-10-CM | POA: Diagnosis not present

## 2016-09-09 DIAGNOSIS — D500 Iron deficiency anemia secondary to blood loss (chronic): Secondary | ICD-10-CM | POA: Diagnosis not present

## 2016-09-09 HISTORY — PX: TEE WITHOUT CARDIOVERSION: SHX5443

## 2016-09-09 HISTORY — PX: TRANSCATHETER AORTIC VALVE REPLACEMENT, TRANSFEMORAL: SHX6400

## 2016-09-09 LAB — POCT I-STAT 4, (NA,K, GLUC, HGB,HCT)
GLUCOSE: 146 mg/dL — AB (ref 65–99)
HCT: 25 % — ABNORMAL LOW (ref 39.0–52.0)
Hemoglobin: 8.5 g/dL — ABNORMAL LOW (ref 13.0–17.0)
POTASSIUM: 4.1 mmol/L (ref 3.5–5.1)
Sodium: 143 mmol/L (ref 135–145)

## 2016-09-09 LAB — POCT I-STAT 3, ART BLOOD GAS (G3+)
Acid-base deficit: 3 mmol/L — ABNORMAL HIGH (ref 0.0–2.0)
BICARBONATE: 21.6 mmol/L (ref 20.0–28.0)
Bicarbonate: 25 mmol/L (ref 20.0–28.0)
O2 Saturation: 100 %
O2 Saturation: 99 %
PCO2 ART: 36.4 mmHg (ref 32.0–48.0)
PCO2 ART: 44 mmHg (ref 32.0–48.0)
PH ART: 7.363 (ref 7.350–7.450)
PO2 ART: 220 mmHg — AB (ref 83.0–108.0)
TCO2: 23 mmol/L (ref 0–100)
TCO2: 26 mmol/L (ref 0–100)
pH, Arterial: 7.382 (ref 7.350–7.450)
pO2, Arterial: 143 mmHg — ABNORMAL HIGH (ref 83.0–108.0)

## 2016-09-09 LAB — POCT I-STAT, CHEM 8
BUN: 15 mg/dL (ref 6–20)
BUN: 14 mg/dL (ref 6–20)
BUN: 14 mg/dL (ref 6–20)
CALCIUM ION: 1.26 mmol/L (ref 1.15–1.40)
CALCIUM ION: 1.3 mmol/L (ref 1.15–1.40)
CHLORIDE: 106 mmol/L (ref 101–111)
CHLORIDE: 105 mmol/L (ref 101–111)
Calcium, Ion: 1.21 mmol/L (ref 1.15–1.40)
Chloride: 108 mmol/L (ref 101–111)
Creatinine, Ser: 0.9 mg/dL (ref 0.61–1.24)
Creatinine, Ser: 0.9 mg/dL (ref 0.61–1.24)
Creatinine, Ser: 0.9 mg/dL (ref 0.61–1.24)
GLUCOSE: 168 mg/dL — AB (ref 65–99)
Glucose, Bld: 177 mg/dL — ABNORMAL HIGH (ref 65–99)
Glucose, Bld: 187 mg/dL — ABNORMAL HIGH (ref 65–99)
HCT: 25 % — ABNORMAL LOW (ref 39.0–52.0)
HEMATOCRIT: 23 % — AB (ref 39.0–52.0)
HEMATOCRIT: 25 % — AB (ref 39.0–52.0)
HEMOGLOBIN: 8.5 g/dL — AB (ref 13.0–17.0)
HEMOGLOBIN: 8.5 g/dL — AB (ref 13.0–17.0)
Hemoglobin: 7.8 g/dL — ABNORMAL LOW (ref 13.0–17.0)
POTASSIUM: 4.2 mmol/L (ref 3.5–5.1)
POTASSIUM: 4.3 mmol/L (ref 3.5–5.1)
Potassium: 4.3 mmol/L (ref 3.5–5.1)
SODIUM: 140 mmol/L (ref 135–145)
SODIUM: 140 mmol/L (ref 135–145)
SODIUM: 141 mmol/L (ref 135–145)
TCO2: 28 mmol/L (ref 0–100)
TCO2: 23 mmol/L (ref 0–100)
TCO2: 25 mmol/L (ref 0–100)

## 2016-09-09 LAB — CBC
HEMATOCRIT: 27.6 % — AB (ref 39.0–52.0)
Hemoglobin: 9.1 g/dL — ABNORMAL LOW (ref 13.0–17.0)
MCH: 30.4 pg (ref 26.0–34.0)
MCHC: 33 g/dL (ref 30.0–36.0)
MCV: 92.3 fL (ref 78.0–100.0)
Platelets: 106 10*3/uL — ABNORMAL LOW (ref 150–400)
RBC: 2.99 MIL/uL — AB (ref 4.22–5.81)
RDW: 15.9 % — AB (ref 11.5–15.5)
WBC: 5.3 10*3/uL (ref 4.0–10.5)

## 2016-09-09 LAB — GLUCOSE, CAPILLARY
GLUCOSE-CAPILLARY: 103 mg/dL — AB (ref 65–99)
Glucose-Capillary: 133 mg/dL — ABNORMAL HIGH (ref 65–99)
Glucose-Capillary: 159 mg/dL — ABNORMAL HIGH (ref 65–99)

## 2016-09-09 LAB — PROTIME-INR
INR: 1.27
Prothrombin Time: 16 seconds — ABNORMAL HIGH (ref 11.4–15.2)

## 2016-09-09 LAB — PREPARE RBC (CROSSMATCH)

## 2016-09-09 LAB — HEMOGLOBIN A1C
HEMOGLOBIN A1C: 4.8 % (ref 4.8–5.6)
MEAN PLASMA GLUCOSE: 91 mg/dL

## 2016-09-09 LAB — APTT: APTT: 37 s — AB (ref 24–36)

## 2016-09-09 SURGERY — IMPLANTATION, AORTIC VALVE, TRANSCATHETER, FEMORAL APPROACH
Anesthesia: General | Site: Groin

## 2016-09-09 MED ORDER — ROCURONIUM BROMIDE 10 MG/ML (PF) SYRINGE
PREFILLED_SYRINGE | INTRAVENOUS | Status: DC | PRN
  Administered 2016-09-09: 50 mg via INTRAVENOUS
  Administered 2016-09-09: 10 mg via INTRAVENOUS

## 2016-09-09 MED ORDER — METOPROLOL TARTRATE 5 MG/5ML IV SOLN: 2.5000 mg | INTRAVENOUS | Status: DC | PRN

## 2016-09-09 MED ORDER — LACTATED RINGERS IV SOLN
500.0000 mL | Freq: Once | INTRAVENOUS | Status: AC | PRN
  Administered 2016-09-09: 12:00:00 via INTRAVENOUS

## 2016-09-09 MED ORDER — OXYCODONE HCL 5 MG PO TABS: 5.0000 mg | ORAL_TABLET | ORAL | Status: DC | PRN

## 2016-09-09 MED ORDER — CHLORHEXIDINE GLUCONATE 4 % EX LIQD: 60.0000 mL | Freq: Once | CUTANEOUS | Status: DC

## 2016-09-09 MED ORDER — LIDOCAINE 2% (20 MG/ML) 5 ML SYRINGE
INTRAMUSCULAR | Status: DC | PRN
  Administered 2016-09-09: 100 mg via INTRAVENOUS

## 2016-09-09 MED ORDER — FENTANYL CITRATE (PF) 250 MCG/5ML IJ SOLN
INTRAMUSCULAR | Status: AC
  Filled 2016-09-09: qty 5

## 2016-09-09 MED ORDER — TRAMADOL HCL 50 MG PO TABS: 50.0000 mg | ORAL_TABLET | ORAL | Status: DC | PRN

## 2016-09-09 MED ORDER — DEXMEDETOMIDINE HCL IN NACL 200 MCG/50ML IV SOLN: 0.1000 ug/kg/h | INTRAVENOUS | Status: DC

## 2016-09-09 MED ORDER — PROTAMINE SULFATE 10 MG/ML IV SOLN
INTRAVENOUS | Status: DC | PRN
  Administered 2016-09-09 (×2): 30 mg via INTRAVENOUS
  Administered 2016-09-09: 10 mg via INTRAVENOUS
  Administered 2016-09-09: 20 mg via INTRAVENOUS

## 2016-09-09 MED ORDER — PROPOFOL 10 MG/ML IV BOLUS
INTRAVENOUS | Status: DC | PRN
  Administered 2016-09-09: 100 mg via INTRAVENOUS
  Administered 2016-09-09: 20 mg via INTRAVENOUS

## 2016-09-09 MED ORDER — MORPHINE SULFATE (PF) 2 MG/ML IV SOLN: 1.0000 mg | INTRAVENOUS | Status: AC | PRN

## 2016-09-09 MED ORDER — 0.9 % SODIUM CHLORIDE (POUR BTL) OPTIME
TOPICAL | Status: DC | PRN
  Administered 2016-09-09: 3000 mL

## 2016-09-09 MED ORDER — VANCOMYCIN HCL IN DEXTROSE 1-5 GM/200ML-% IV SOLN
1000.0000 mg | Freq: Once | INTRAVENOUS | Status: AC
  Administered 2016-09-09: 1000 mg via INTRAVENOUS
  Filled 2016-09-09: qty 200

## 2016-09-09 MED ORDER — SODIUM CHLORIDE 0.9 % IV SOLN: 250.0000 mL | INTRAVENOUS | Status: DC | PRN

## 2016-09-09 MED ORDER — FERROUS SULFATE 325 (65 FE) MG PO TABS
325.0000 mg | ORAL_TABLET | Freq: Two times a day (BID) | ORAL | Status: DC
  Administered 2016-09-09 – 2016-09-11 (×4): 325 mg via ORAL
  Filled 2016-09-09 (×4): qty 1

## 2016-09-09 MED ORDER — ASPIRIN 81 MG PO CHEW: 324.0000 mg | CHEWABLE_TABLET | Freq: Every day | ORAL | Status: DC

## 2016-09-09 MED ORDER — ALBUMIN HUMAN 5 % IV SOLN: 250.0000 mL | INTRAVENOUS | Status: DC | PRN

## 2016-09-09 MED ORDER — ONDANSETRON HCL 4 MG/2ML IJ SOLN: 4.0000 mg | Freq: Once | INTRAMUSCULAR | Status: DC | PRN

## 2016-09-09 MED ORDER — CHLORHEXIDINE GLUCONATE 4 % EX LIQD: 30.0000 mL | CUTANEOUS | Status: DC

## 2016-09-09 MED ORDER — LACTATED RINGERS IV SOLN: INTRAVENOUS | Status: DC

## 2016-09-09 MED ORDER — FAMOTIDINE IN NACL 20-0.9 MG/50ML-% IV SOLN
20.0000 mg | Freq: Two times a day (BID) | INTRAVENOUS | Status: DC
  Administered 2016-09-09: 20 mg via INTRAVENOUS
  Filled 2016-09-09: qty 50

## 2016-09-09 MED ORDER — MEPERIDINE HCL 25 MG/ML IJ SOLN: 6.2500 mg | INTRAMUSCULAR | Status: DC | PRN

## 2016-09-09 MED ORDER — PHENYLEPHRINE HCL 10 MG/ML IJ SOLN
0.0000 ug/min | INTRAMUSCULAR | Status: DC
  Filled 2016-09-09: qty 2

## 2016-09-09 MED ORDER — PRAVASTATIN SODIUM 80 MG PO TABS
80.0000 mg | ORAL_TABLET | Freq: Every day | ORAL | Status: DC
  Administered 2016-09-09 – 2016-09-10 (×2): 80 mg via ORAL
  Filled 2016-09-09 (×2): qty 1

## 2016-09-09 MED ORDER — FENTANYL CITRATE (PF) 100 MCG/2ML IJ SOLN
INTRAMUSCULAR | Status: DC | PRN
  Administered 2016-09-09 (×3): 50 ug via INTRAVENOUS
  Administered 2016-09-09: 100 ug via INTRAVENOUS

## 2016-09-09 MED ORDER — GLYCOPYRROLATE 0.2 MG/ML IV SOSY
PREFILLED_SYRINGE | INTRAVENOUS | Status: DC | PRN
  Administered 2016-09-09 (×2): .1 mg via INTRAVENOUS

## 2016-09-09 MED ORDER — ONDANSETRON HCL 4 MG/2ML IJ SOLN
INTRAMUSCULAR | Status: DC | PRN
  Administered 2016-09-09: 4 mg via INTRAVENOUS

## 2016-09-09 MED ORDER — ASPIRIN EC 325 MG PO TBEC
325.0000 mg | DELAYED_RELEASE_TABLET | Freq: Every day | ORAL | Status: DC
  Filled 2016-09-09: qty 1

## 2016-09-09 MED ORDER — METOPROLOL TARTRATE 12.5 MG HALF TABLET: 12.5000 mg | ORAL_TABLET | Freq: Two times a day (BID) | ORAL | Status: DC

## 2016-09-09 MED ORDER — SODIUM CHLORIDE 0.9 % IV SOLN
1.0000 mL/kg/h | INTRAVENOUS | Status: AC
  Administered 2016-09-09: 1 mL/kg/h via INTRAVENOUS

## 2016-09-09 MED ORDER — ACETAMINOPHEN 650 MG RE SUPP: 650.0000 mg | Freq: Once | RECTAL | Status: DC

## 2016-09-09 MED ORDER — CHLORHEXIDINE GLUCONATE 0.12 % MT SOLN
15.0000 mL | OROMUCOSAL | Status: DC
  Filled 2016-09-09: qty 15

## 2016-09-09 MED ORDER — DEXTROSE 5 % IV SOLN
1.5000 g | Freq: Two times a day (BID) | INTRAVENOUS | Status: DC
  Administered 2016-09-09 – 2016-09-10 (×3): 1.5 g via INTRAVENOUS
  Filled 2016-09-09 (×4): qty 1.5

## 2016-09-09 MED ORDER — MORPHINE SULFATE (PF) 2 MG/ML IV SOLN: 2.0000 mg | INTRAVENOUS | Status: DC | PRN

## 2016-09-09 MED ORDER — SODIUM CHLORIDE 0.9% FLUSH: 3.0000 mL | INTRAVENOUS | Status: DC | PRN

## 2016-09-09 MED ORDER — NITROGLYCERIN IN D5W 200-5 MCG/ML-% IV SOLN
0.0000 ug/min | INTRAVENOUS | Status: DC
  Administered 2016-09-09: 10 ug/min via INTRAVENOUS

## 2016-09-09 MED ORDER — PANTOPRAZOLE SODIUM 40 MG PO TBEC
40.0000 mg | DELAYED_RELEASE_TABLET | Freq: Every day | ORAL | Status: DC
  Administered 2016-09-10: 40 mg via ORAL
  Filled 2016-09-09: qty 1

## 2016-09-09 MED ORDER — HEPARIN SODIUM (PORCINE) 1000 UNIT/ML IJ SOLN
INTRAMUSCULAR | Status: DC | PRN
  Administered 2016-09-09: 9000 [IU] via INTRAVENOUS

## 2016-09-09 MED ORDER — HYDROMORPHONE HCL 1 MG/ML IJ SOLN: 0.2500 mg | INTRAMUSCULAR | Status: DC | PRN

## 2016-09-09 MED ORDER — HEPARIN SODIUM (PORCINE) 1000 UNIT/ML IJ SOLN
INTRAMUSCULAR | Status: AC
  Filled 2016-09-09: qty 1

## 2016-09-09 MED ORDER — INSULIN ASPART 100 UNIT/ML ~~LOC~~ SOLN
0.0000 [IU] | Freq: Three times a day (TID) | SUBCUTANEOUS | Status: DC
  Administered 2016-09-10 (×2): 2 [IU] via SUBCUTANEOUS

## 2016-09-09 MED ORDER — HEPARIN SODIUM (PORCINE) 5000 UNIT/ML IJ SOLN
INTRAMUSCULAR | Status: DC | PRN
  Administered 2016-09-09: 07:00:00 1500 mL

## 2016-09-09 MED ORDER — SODIUM CHLORIDE 0.9% FLUSH: 3.0000 mL | Freq: Two times a day (BID) | INTRAVENOUS | Status: DC

## 2016-09-09 MED ORDER — PROPOFOL 10 MG/ML IV BOLUS
INTRAVENOUS | Status: AC
  Filled 2016-09-09: qty 20

## 2016-09-09 MED ORDER — SUGAMMADEX SODIUM 200 MG/2ML IV SOLN
INTRAVENOUS | Status: DC | PRN
  Administered 2016-09-09: 200 mg via INTRAVENOUS

## 2016-09-09 MED ORDER — ACETAMINOPHEN 500 MG PO TABS
1000.0000 mg | ORAL_TABLET | Freq: Four times a day (QID) | ORAL | Status: DC
  Administered 2016-09-09 – 2016-09-10 (×4): 1000 mg via ORAL
  Filled 2016-09-09 (×4): qty 2

## 2016-09-09 MED ORDER — LACTATED RINGERS IV SOLN
INTRAVENOUS | Status: DC | PRN
  Administered 2016-09-09 (×2): via INTRAVENOUS

## 2016-09-09 MED ORDER — MIDAZOLAM HCL 2 MG/2ML IJ SOLN: 2.0000 mg | INTRAMUSCULAR | Status: DC | PRN

## 2016-09-09 MED ORDER — IODIXANOL 320 MG/ML IV SOLN
INTRAVENOUS | Status: DC | PRN
  Administered 2016-09-09: 60.7 mL via INTRAVENOUS

## 2016-09-09 MED ORDER — METOPROLOL TARTRATE 25 MG/10 ML ORAL SUSPENSION: 12.5000 mg | Freq: Two times a day (BID) | ORAL | Status: DC

## 2016-09-09 MED ORDER — ONDANSETRON HCL 4 MG/2ML IJ SOLN: 4.0000 mg | Freq: Four times a day (QID) | INTRAMUSCULAR | Status: DC | PRN

## 2016-09-09 MED ORDER — ACETAMINOPHEN 160 MG/5ML PO SOLN: 650.0000 mg | Freq: Once | ORAL | Status: DC

## 2016-09-09 MED ORDER — ACETAMINOPHEN 160 MG/5ML PO SOLN: 1000.0000 mg | Freq: Four times a day (QID) | ORAL | Status: DC

## 2016-09-09 MED FILL — Magnesium Sulfate Inj 50%: INTRAMUSCULAR | Qty: 10 | Status: AC

## 2016-09-09 MED FILL — Potassium Chloride Inj 2 mEq/ML: INTRAVENOUS | Qty: 40 | Status: AC

## 2016-09-09 MED FILL — Heparin Sodium (Porcine) Inj 1000 Unit/ML: INTRAMUSCULAR | Qty: 30 | Status: AC

## 2016-09-09 SURGICAL SUPPLY — 105 items
ADAPTER UNIV SWAN GANZ BIP (ADAPTER) ×2 IMPLANT
ADAPTER UNV SWAN GANZ BIP (ADAPTER) ×2
ADH SKN CLS APL DERMABOND .7 (GAUZE/BANDAGES/DRESSINGS) ×2
ADPR CATH UNV NS SG CATH (ADAPTER) ×2
ATTRACTOMAT 16X20 MAGNETIC DRP (DRAPES) IMPLANT
BAG BANDED W/RUBBER/TAPE 36X54 (MISCELLANEOUS) ×4 IMPLANT
BAG DECANTER FOR FLEXI CONT (MISCELLANEOUS) IMPLANT
BAG EQP BAND 135X91 W/RBR TAPE (MISCELLANEOUS) ×2
BAG SNAP BAND KOVER 36X36 (MISCELLANEOUS) ×8 IMPLANT
BLADE 10 SAFETY STRL DISP (BLADE) ×4 IMPLANT
BLADE STERNUM SYSTEM 6 (BLADE) ×4 IMPLANT
BLADE SURG ROTATE 9660 (MISCELLANEOUS) ×4 IMPLANT
CABLE PACING FASLOC BIEGE (MISCELLANEOUS) ×4 IMPLANT
CABLE PACING FASLOC BLUE (MISCELLANEOUS) ×4 IMPLANT
CANISTER SUCTION 2500CC (MISCELLANEOUS) IMPLANT
CANNULA FEM VENOUS REMOTE 22FR (CANNULA) IMPLANT
CANNULA OPTISITE PERFUSION 16F (CANNULA) IMPLANT
CANNULA OPTISITE PERFUSION 18F (CANNULA) IMPLANT
CATH DIAG EXPO 6F VENT PIG 145 (CATHETERS) ×8 IMPLANT
CATH EXPO 5FR AL1 (CATHETERS) ×4 IMPLANT
CATH S G BIP PACING (SET/KITS/TRAYS/PACK) ×8 IMPLANT
CLIP TI MEDIUM 24 (CLIP) ×4 IMPLANT
CLIP TI WIDE RED SMALL 24 (CLIP) ×4 IMPLANT
CONT SPEC 4OZ CLIKSEAL STRL BL (MISCELLANEOUS) ×12 IMPLANT
COVER DOME SNAP 22 D (MISCELLANEOUS) ×4 IMPLANT
COVER MAYO STAND STRL (DRAPES) ×4 IMPLANT
COVER TABLE BACK 60X90 (DRAPES) ×4 IMPLANT
CRADLE DONUT ADULT HEAD (MISCELLANEOUS) ×4 IMPLANT
DERMABOND ADVANCED (GAUZE/BANDAGES/DRESSINGS) ×2
DERMABOND ADVANCED .7 DNX12 (GAUZE/BANDAGES/DRESSINGS) ×2 IMPLANT
DEVICE CLOSURE PERCLS PRGLD 6F (VASCULAR PRODUCTS) IMPLANT
DRAPE INCISE IOBAN 66X45 STRL (DRAPES) IMPLANT
DRAPE SLUSH MACHINE 52X66 (DRAPES) ×4 IMPLANT
DRAPE TABLE COVER HEAVY DUTY (DRAPES) ×4 IMPLANT
DRSG TEGADERM 2-3/8X2-3/4 SM (GAUZE/BANDAGES/DRESSINGS) ×4 IMPLANT
DRSG TEGADERM 4X4.75 (GAUZE/BANDAGES/DRESSINGS) ×4 IMPLANT
ELECT REM PT RETURN 9FT ADLT (ELECTROSURGICAL) ×8
ELECTRODE REM PT RTRN 9FT ADLT (ELECTROSURGICAL) ×4 IMPLANT
FELT TEFLON 6X6 (MISCELLANEOUS) ×4 IMPLANT
FEMORAL VENOUS CANN RAP (CANNULA) IMPLANT
GAUZE SPONGE 2X2 8PLY STRL LF (GAUZE/BANDAGES/DRESSINGS) ×2 IMPLANT
GAUZE SPONGE 4X4 12PLY STRL (GAUZE/BANDAGES/DRESSINGS) ×4 IMPLANT
GLOVE BIO SURGEON STRL SZ7.5 (GLOVE) ×4 IMPLANT
GLOVE BIO SURGEON STRL SZ8 (GLOVE) ×8 IMPLANT
GLOVE BIOGEL PI IND STRL 6.5 (GLOVE) ×2 IMPLANT
GLOVE BIOGEL PI INDICATOR 6.5 (GLOVE) ×2
GLOVE EUDERMIC 7 POWDERFREE (GLOVE) ×4 IMPLANT
GLOVE ORTHO TXT STRL SZ7.5 (GLOVE) ×4 IMPLANT
GOWN STRL REUS W/ TWL LRG LVL3 (GOWN DISPOSABLE) ×6 IMPLANT
GOWN STRL REUS W/ TWL XL LVL3 (GOWN DISPOSABLE) ×12 IMPLANT
GOWN STRL REUS W/TWL LRG LVL3 (GOWN DISPOSABLE) ×12
GOWN STRL REUS W/TWL XL LVL3 (GOWN DISPOSABLE) ×24
GUIDEWIRE SAF TJ AMPL .035X180 (WIRE) ×4 IMPLANT
GUIDEWIRE SAFE TJ AMPLATZ EXST (WIRE) ×4 IMPLANT
GUIDEWIRE STRAIGHT .035 260CM (WIRE) ×4 IMPLANT
INSERT FOGARTY 61MM (MISCELLANEOUS) ×4 IMPLANT
INSERT FOGARTY SM (MISCELLANEOUS) IMPLANT
INSERT FOGARTY XLG (MISCELLANEOUS) IMPLANT
KIT BASIN OR (CUSTOM PROCEDURE TRAY) ×4 IMPLANT
KIT DILATOR VASC 18G NDL (KITS) IMPLANT
KIT HEART LEFT (KITS) ×4 IMPLANT
KIT ROOM TURNOVER OR (KITS) ×4 IMPLANT
KIT SUCTION CATH 14FR (SUCTIONS) ×8 IMPLANT
NEEDLE PERC 18GX7CM (NEEDLE) ×4 IMPLANT
NS IRRIG 1000ML POUR BTL (IV SOLUTION) ×12 IMPLANT
PACK AORTA (CUSTOM PROCEDURE TRAY) ×4 IMPLANT
PAD ARMBOARD 7.5X6 YLW CONV (MISCELLANEOUS) ×8 IMPLANT
PAD ELECT DEFIB RADIOL ZOLL (MISCELLANEOUS) ×4 IMPLANT
PATCH TACHOSII LRG 9.5X4.8 (VASCULAR PRODUCTS) IMPLANT
PERCLOSE PROGLIDE 6F (VASCULAR PRODUCTS)
SET MICROPUNCTURE 5F STIFF (MISCELLANEOUS) ×4 IMPLANT
SHEATH AVANTI 11CM 8FR (MISCELLANEOUS) ×4 IMPLANT
SHEATH PINNACLE 6F 10CM (SHEATH) ×8 IMPLANT
SLEEVE REPOSITIONING LENGTH 30 (MISCELLANEOUS) ×4 IMPLANT
SPONGE GAUZE 2X2 STER 10/PKG (GAUZE/BANDAGES/DRESSINGS) ×2
SPONGE LAP 4X18 X RAY DECT (DISPOSABLE) ×4 IMPLANT
STOPCOCK MORSE 400PSI 3WAY (MISCELLANEOUS) ×8 IMPLANT
SUT ETHIBOND X763 2 0 SH 1 (SUTURE) IMPLANT
SUT GORETEX CV 4 TH 22 36 (SUTURE) IMPLANT
SUT GORETEX CV4 TH-18 (SUTURE) IMPLANT
SUT GORETEX TH-18 36 INCH (SUTURE) IMPLANT
SUT MNCRL AB 3-0 PS2 18 (SUTURE) IMPLANT
SUT PROLENE 3 0 SH1 36 (SUTURE) IMPLANT
SUT PROLENE 4 0 RB 1 (SUTURE)
SUT PROLENE 4-0 RB1 .5 CRCL 36 (SUTURE) IMPLANT
SUT PROLENE 5 0 C 1 36 (SUTURE) IMPLANT
SUT PROLENE 6 0 C 1 30 (SUTURE) IMPLANT
SUT SILK  1 MH (SUTURE) ×2
SUT SILK 1 MH (SUTURE) ×2 IMPLANT
SUT SILK 2 0 SH CR/8 (SUTURE) IMPLANT
SUT VIC AB 2-0 CT1 27 (SUTURE)
SUT VIC AB 2-0 CT1 TAPERPNT 27 (SUTURE) IMPLANT
SUT VIC AB 2-0 CTX 36 (SUTURE) IMPLANT
SUT VIC AB 3-0 SH 8-18 (SUTURE) IMPLANT
SYR 30ML LL (SYRINGE) ×8 IMPLANT
SYR 50ML LL SCALE MARK (SYRINGE) ×4 IMPLANT
SYRINGE 10CC LL (SYRINGE) ×12 IMPLANT
TOWEL OR 17X26 10 PK STRL BLUE (TOWEL DISPOSABLE) ×8 IMPLANT
TRANSDUCER W/STOPCOCK (MISCELLANEOUS) ×8 IMPLANT
TRAY FOLEY IC TEMP SENS 14FR (CATHETERS) ×4 IMPLANT
TUBE SUCT INTRACARD DLP 20F (MISCELLANEOUS) IMPLANT
TUBING HIGH PRESSURE 120CM (CONNECTOR) ×4 IMPLANT
VALVE HEART TRANSCATH SZ3 29MM (Prosthesis & Implant Heart) ×4 IMPLANT
WIRE AMPLATZ SS-J .035X180CM (WIRE) ×4 IMPLANT
WIRE BENTSON .035X145CM (WIRE) ×4 IMPLANT

## 2016-09-09 NOTE — Transfer of Care (Signed)
Immediate Anesthesia Transfer of Care Note  Patient: Marvin Houston  Procedure(s) Performed: Procedure(s): TRANSCATHETER AORTIC VALVE REPLACEMENT, TRANSFEMORAL (Bilateral) TRANSESOPHAGEAL ECHOCARDIOGRAM (TEE) (N/A)  Patient Location: SICU  Anesthesia Type:General  Level of Consciousness: awake, alert  and oriented  Airway & Oxygen Therapy: Patient Spontanous Breathing and Patient connected to nasal cannula oxygen  Post-op Assessment: Report given to RN and Post -op Vital signs reviewed and stable  Post vital signs: Reviewed and stable  Last Vitals:  Vitals:   09/09/16 0917 09/09/16 0922  BP:    Pulse: 93 90  Resp:    Temp:     HR 46, BP 124/62, RR 11, Sats 100% on 2L [place] Last Pain:  Vitals:   09/09/16 0616  TempSrc: Oral         Complications: No apparent anesthesia complications

## 2016-09-09 NOTE — Progress Notes (Signed)
  Echocardiogram Echocardiogram Transesophageal has been performed.  Aadan Chenier L Androw 09/09/2016, 1:41 PM

## 2016-09-09 NOTE — Interval H&P Note (Signed)
History and Physical Interval Note:  09/09/2016 7:05 AM  Marvin Houston  has presented today for surgery, with the diagnosis of SEVERE AS  The various methods of treatment have been discussed with the patient and family. After consideration of risks, benefits and other options for treatment, the patient has consented to  Procedure(s): TRANSCATHETER AORTIC VALVE REPLACEMENT, TRANSFEMORAL (N/A) TRANSESOPHAGEAL ECHOCARDIOGRAM (TEE) (N/A) as a surgical intervention .  The patient's history has been reviewed, patient examined, no change in status, stable for surgery.  I have reviewed the patient's chart and labs.  Questions were answered to the patient's satisfaction.     Gaye Pollack

## 2016-09-09 NOTE — Anesthesia Preprocedure Evaluation (Signed)
Anesthesia Evaluation  Patient identified by MRN, date of birth, ID band Patient awake    Reviewed: Allergy & Precautions, NPO status , Patient's Chart, lab work & pertinent test results  Airway Mallampati: I  TM Distance: >3 FB Neck ROM: Full    Dental   Pulmonary    Pulmonary exam normal        Cardiovascular hypertension, Normal cardiovascular exam     Neuro/Psych    GI/Hepatic   Endo/Other  diabetes, Type 2, Oral Hypoglycemic Agents  Renal/GU      Musculoskeletal   Abdominal   Peds  Hematology   Anesthesia Other Findings   Reproductive/Obstetrics                             Anesthesia Physical Anesthesia Plan  ASA: III  Anesthesia Plan: General   Post-op Pain Management:    Induction: Intravenous  Airway Management Planned: Oral ETT  Additional Equipment: Arterial line, CVP and Ultrasound Guidance Line Placement  Intra-op Plan:   Post-operative Plan: Extubation in OR  Informed Consent: I have reviewed the patients History and Physical, chart, labs and discussed the procedure including the risks, benefits and alternatives for the proposed anesthesia with the patient or authorized representative who has indicated his/her understanding and acceptance.     Plan Discussed with: CRNA and Surgeon  Anesthesia Plan Comments:         Anesthesia Quick Evaluation

## 2016-09-09 NOTE — Progress Notes (Signed)
  Echocardiogram 2D Echocardiogram has been performed.  Helmer Dull L Androw 09/09/2016, 9:24 AM

## 2016-09-09 NOTE — Anesthesia Postprocedure Evaluation (Signed)
Anesthesia Post Note  Patient: Marvin Houston  Procedure(s) Performed: Procedure(s) (LRB): TRANSCATHETER AORTIC VALVE REPLACEMENT, TRANSFEMORAL (Bilateral) TRANSESOPHAGEAL ECHOCARDIOGRAM (TEE) (N/A)  Patient location during evaluation: PACU Anesthesia Type: General Level of consciousness: awake and alert Pain management: pain level controlled Vital Signs Assessment: post-procedure vital signs reviewed and stable Respiratory status: spontaneous breathing, nonlabored ventilation, respiratory function stable and patient connected to nasal cannula oxygen Cardiovascular status: blood pressure returned to baseline and stable Postop Assessment: no signs of nausea or vomiting Anesthetic complications: no       Last Vitals:  Vitals:   09/09/16 1530 09/09/16 1545  BP:    Pulse: (!) 51 (!) 53  Resp: 12 15  Temp:      Last Pain:  Vitals:   09/09/16 1228  TempSrc: Oral                 [name] DAVID

## 2016-09-09 NOTE — Op Note (Signed)
[facility]   MULTIDISCIPLINARY HEART VALVE TEAM   TAVR OPERATIVE NOTE   Date of Procedure:  09/09/2016  Preoperative Diagnosis: Severe Aortic Stenosis   Postoperative Diagnosis: Same   Procedure:    Transcatheter Aortic Valve Replacement - Percutaneous  Transfemoral Approach  Edwards Sapien 3 THV (size 29 mm, model # 9600TFX, serial # [id number])   Co-Surgeons:  Gaye Pollack, MD and Sherren Mocha, MD  Anesthesiologist:  Lillia Abed, MD  Echocardiographer:  Jenkins Rouge, MD  Pre-operative Echo Findings:  Severe aortic stenosis  Normal left ventricular systolic function  Mild MR  Post-operative Echo Findings:  No paravalvular leak  Normal left ventricular systolic function  Mild MR (unchanged)  BRIEF CLINICAL NOTE AND INDICATIONS FOR SURGERY  83 yo gentleman presenting today for TAVR surgery. He has DM, HTN, severe aortic stenosis, and recurrent GI bleeding from large colonic AVM's. He has developed progressive and now severe symptomatic aortic stenosis and presents for TAVR after undergoing extensive evaluation with imaging studies, cardiac cath, echo, CTA, and cardiac surgical evaluation.   During the course of the patient's preoperative work up they have been evaluated comprehensively by a multidisciplinary team of specialists coordinated through the [facility] in the [facility].  They have been demonstrated to suffer from symptomatic severe aortic stenosis as noted above. The patient has been counseled extensively as to the relative risks and benefits of all options for the treatment of severe aortic stenosis including long term medical therapy, conventional surgery for aortic valve replacement, and transcatheter aortic valve replacement.  The patient has been independently evaluated by two cardiac surgeons including Dr. Cyndia Bent and Dr. Servando Snare, and they are felt to be at moderate risk for conventional  surgical aortic valve replacement. Based upon review of all of the patient's preoperative diagnostic tests they are felt to be candidate for transcatheter aortic valve replacement using the transfemoral approach as an alternative to high risk conventional surgery.    Following the decision to proceed with transcatheter aortic valve replacement, a discussion has been held regarding what types of management strategies would be attempted intraoperatively in the event of life-threatening complications, including whether or not the patient would be considered a candidate for the use of cardiopulmonary bypass and/or conversion to open sternotomy for attempted surgical intervention.  The patient has been advised of a variety of complications that might develop peculiar to this approach including but not limited to risks of death, stroke, paravalvular leak, aortic dissection or other major vascular complications, aortic annulus rupture, device embolization, cardiac rupture or perforation, acute myocardial infarction, arrhythmia, heart block or bradycardia requiring permanent pacemaker placement, congestive heart failure, respiratory failure, renal failure, pneumonia, infection, other late complications related to structural valve deterioration or migration, or other complications that might ultimately cause a temporary or permanent loss of functional independence or other long term morbidity.  The patient provides full informed consent for the procedure as described and all questions were answered preoperatively.    DETAILS OF THE OPERATIVE PROCEDURE  PREPARATION:   The patient is brought to the operating room on the above mentioned date and central monitoring was established by the anesthesia team including placement of Swan-Ganz catheter and radial arterial line. The patient is placed in the supine position on the operating table.  Intravenous antibiotics are administered.  General endotracheal anesthesia is  induced uneventfully.  A Foley catheter is placed.  Baseline transesophageal echocardiogram was performed. The patient's chest, abdomen, both groins,  and both lower extremities are prepared and draped in a sterile manner. A time out procedure is performed.   PERIPHERAL ACCESS:    Using the modified Seldinger technique, femoral arterial and venous access was obtained with placement of 6 Fr sheaths on the left side.  A pigtail diagnostic catheter was passed through the left femoral arterial sheath under fluoroscopic guidance into the aortic root.  A temporary transvenous pacemaker catheter was passed through the left femoral venous sheath under fluoroscopic guidance into the right ventricle.  The pacemaker was tested to ensure stable lead placement and pacemaker capture. Aortic root angiography was performed in order to determine the optimal angiographic angle for valve deployment.   TRANSFEMORAL ACCESS:  A micropuncture technique is used to access the right femoral artery under fluoroscopic guidance.  Femoral angiography is performed to verify access in the common femoral artery. 2 Perclose devices are deployed at 10' and 2' positions to 'PreClose' the femoral artery. An 8 French sheath is placed and then an Amplatz Superstiff wire is advanced through the sheath. This is changed out for a 16 French transfemoral E-Sheath after progressively dilating over the Superstiff wire.  An AL-1 catheter was used to direct a straight-tip exchange length wire across the native aortic valve into the left ventricle. This was exchanged out for a pigtail catheter and position was confirmed in the LV apex. Simultaneous LV and Ao pressures were recorded.  The pigtail catheter was exchanged for an Amplatz Extra-stiff wire in the LV apex.  Echocardiography was utilized to confirm appropriate wire position and no sign of entanglement in the mitral subvalvular apparatus.  TRANSCATHETER HEART VALVE DEPLOYMENT:  An Edwards  Sapien 3 transcatheter heart valve (size 29 mm, model #9600TFX, serial [id number]) was prepared and crimped per manufacturer's guidelines, and the proper orientation of the valve is confirmed on the [employer] delivery system. The valve was advanced through the introducer sheath using normal technique until in an appropriate position in the abdominal aorta beyond the sheath tip. The balloon was then retracted and using the fine-tuning wheel was centered on the valve. The valve was then advanced across the aortic arch using appropriate flexion of the catheter. The valve was carefully positioned across the aortic valve annulus. The Commander catheter was retracted using normal technique. Once final position of the valve has been confirmed by angiographic assessment, the valve is deployed while temporarily holding ventilation and during rapid ventricular pacing to maintain systolic blood pressure < 50 mmHg and pulse pressure < 10 mmHg. The balloon inflation is held for >3 seconds after reaching full deployment volume. Once the balloon has fully deflated the balloon is retracted into the ascending aorta and valve function is assessed using echocardiography. There is felt to be no paravalvular leak and no central aortic insufficiency.  The patient's hemodynamic recovery following valve deployment is good.  The deployment balloon and guidewire are both removed. Echo demostrated acceptable post-procedural gradients, stable mitral valve function, and no aortic insufficiency.   PROCEDURE COMPLETION:  The sheath was removed and femoral artery closure is performed using the 2 previously deployed Perclose devices.  Protamine was administered once femoral arterial repair was complete. The temporary pacemaker, pigtail catheters and femoral sheaths were removed with manual pressure used for hemostasis.   The patient tolerated the procedure well and is transported to the surgical intensive care in stable condition. There  were no immediate intraoperative complications. All sponge instrument and needle counts are verified correct at completion of the operation.  The patient received a total of 60 mL of intravenous contrast during the procedure.  The patient who has baseline anemia received one unit of PRBC's during the procedure.   Sherren Mocha, MD 09/09/2016 10:03 AM

## 2016-09-09 NOTE — Anesthesia Procedure Notes (Signed)
Central Venous Catheter Insertion Performed by: Roberts Gaudy, anesthesiologist, attending Start/End1/30/2018 6:40 AM, 09/09/2016 6:45 AM Patient location: Pre-op. Preanesthetic checklist: patient identified, IV checked, site marked, risks and benefits discussed, surgical consent, monitors and equipment checked, pre-op evaluation and timeout performed Position: supine Lidocaine 1% used for infiltration Catheter size: 8 Fr Central line was placed.Double lumen Ultrasound Notes:anatomy identified and image(s) printed for medical record Attempts: 1 Following insertion, line sutured, dressing applied and Biopatch. Post procedure assessment: blood return through all ports, free fluid flow and no air  Patient tolerated the procedure well with no immediate complications.

## 2016-09-09 NOTE — Anesthesia Procedure Notes (Signed)
Procedure Name: Intubation Date/Time: 09/09/2016 7:48 AM Performed by: Myna Bright Pre-anesthesia Checklist: Emergency Drugs available, Patient identified, Suction available and Patient being monitored Patient Re-evaluated:Patient Re-evaluated prior to inductionOxygen Delivery Method: Circle system utilized Preoxygenation: Pre-oxygenation with 100% oxygen Intubation Type: IV induction Ventilation: Mask ventilation without difficulty Laryngoscope Size: Mac and 4 Grade View: Grade II Tube type: Oral Tube size: 7.5 mm Number of attempts: 1 Airway Equipment and Method: Stylet Placement Confirmation: ETT inserted through vocal cords under direct vision,  positive ETCO2 and breath sounds checked- equal and bilateral Secured at: 22 cm Tube secured with: Tape Dental Injury: Teeth and Oropharynx as per pre-operative assessment

## 2016-09-09 NOTE — Op Note (Signed)
[facility]   MULTIDISCIPLINARY HEART VALVE TEAM   TAVR OPERATIVE NOTE   Date of Procedure:  09/09/2016  Preoperative Diagnosis: Severe Aortic Stenosis   Postoperative Diagnosis: Same   Procedure:    Transcatheter Aortic Valve Replacement - Percutaneous Right Transfemoral Approach  Edwards Sapien 3 THV (size 29 mm, model # 9600TFX, serial # [id number])   Co-Surgeons:  Gaye Pollack and Sherren Mocha, MD   Anesthesiologist:  Lillia Abed, MD  Echocardiographer:  Jenkins Rouge, MD  Pre-operative Echo Findings:   Severe aortic stenosis   Normal left ventricular systolic function   Mild MR  Post-operative Echo Findings:  NO paravalvular leak  Normal left ventricular systolic function  Unchanged mild MR  BRIEF CLINICAL NOTE AND INDICATIONS FOR SURGERY  The patient is an 83 year old gentleman with hypertension, diabetes, hyperlipidemia, and moderate aortic stenosis followed by echo. In April 2017 he developed severe anemia with lower GI bleeding and colonoscopy found internal and external hemorrhoids as well as several large AVM's in the ascending colon and cecum that were not bleeding. UGI was negative and it was felt that the anemia was likely secondary to chronic disease and hemorrhoids. He did require transfusion. He reports that he has felt more fatigued over the past six months when he plays golf. He has shortness of breath with exertion. He denies any , orthopnea or PND. He denies chest pain and dizziness. He underwent a repeat echo on 06/11/2016 showing moderate concentric LVH with an EF of 58%. The aortic valve was reportedly moderately calcified and severe leaflet restriction and the mean AV gradient was 41 mm Hg with a peak of 68 mm Hg. The calculated AVA was 0.8 cm2. The mean gradient on echo in 11/2015 was 31 mm Hg. He subsequently underwent a TEE on 07/29/2016 showing a mean gradient of 49 mm Hg with a peak of 71 mm Hg and an EF of 55-60%. There was mild  atheromatous plaque throughout the arch and proximal descending aorta. He underwent cath on 08/13/2015 showing 60% mid RCA stenosis that was unchanged from cath in 2007.   The patient was scheduled for TAVR on 09/03/2015 but his preop Hgb on 08/29/2016 was 8.5. His last Hgb in the [facility] system was on 12/11/2015 and was 12.4. We check his Hgb on the morning of surgery and it was 5.4. He related that the night before surgery he had passed a large amount of blood per rectum and felt weak and dizzy. Surgery was canceled and he was admitted for transfusion and further workup. He was seen by his gastroenterologist, Dr. Paulita Fujita, who felt that we should wait until after TAVR to proceed with a colonoscopy as long as the patient had no further bleeding.  His STS PROM score puts him in the low risk group for open surgery but given his age I think recovery from open surgery would be longer and more difficult for him. He does have significant atheromatous plaque in his aortic arch and descending aorta on TEE which may increase his risk of stroke with aortic cannulation for open surgery. He has also had recent GI bleeding and will require further workup of that after valve replacement. Therefore I think TAVR is a better option for him.  I have personally reviewed his gated cardiac CT and CTA of the chest, abdomen and pelvis. He has a functionally bicuspid aortic valve with a raphe between the left and right cusps. There is severe calcification and thickening of the  leaflets. The annular measurement of 666 mm2 appears adequate for a 29 mm Sapien 3 valve. His pelvic arterial anatomy is suitable for a transfemoral approach.   The patient and his wife were counseled at length regarding treatment alternatives for management of severe symptomatic aortic stenosis. Alternative approaches such as conventional aortic valve replacement, transcatheter aortic valve replacement, and palliative medical therapy were compared and contrasted at  length. The risks associated with conventional surgical aortic valve replacement were been discussed in detail, as were expectations for post-operative convalescence. Long-term prognosis with medical therapy was discussed.  We discussed complications that might develop including but not limited to risks of death, stroke, paravalvular leak, aortic dissection or other major vascular complications, aortic annulus rupture, device embolization, cardiac rupture or perforation, mitral regurgitation, acute myocardial infarction, arrhythmia, heart block or bradycardia requiring permanent pacemaker placement, congestive heart failure, respiratory failure, renal failure, pneumonia, infection, other late complications related to structural valve deterioration or migration, or other complications that might ultimately cause a temporary or permanent loss of functional independence or other long term morbidity. The patient provides full informed consent for the procedure as described and all questions were answered.    DETAILS OF THE OPERATIVE PROCEDURE  PREPARATION:    The patient is brought to the operating room on the above mentioned date and central monitoring was established by the anesthesia team including placement of Swan-Ganz catheter and radial arterial line. The patient is placed in the supine position on the operating table.  Intravenous antibiotics are administered.  General endotracheal anesthesia is induced uneventfully. A Foley catheter is placed.  Baseline transesophageal echocardiogram was performed. The patient's chest, abdomen, both groins, and both lower extremities are prepared and draped in a sterile manner. A time out procedure is performed.   Peripheral and transfemoral access were performed by Dr. Burt Knack and will be included in his note.  BALLOON AORTIC VALVULOPLASTY:   Not performed    TRANSCATHETER HEART VALVE DEPLOYMENT:   An Edwards Sapien 3 transcatheter heart valve (size  29 mm, model #9600TFX, serial [id number]) was prepared and crimped per manufacturer's guidelines, and the proper orientation of the valve is confirmed on the [employer] delivery system. The valve was advanced through the introducer sheath using normal technique until in an appropriate position in the abdominal aorta beyond the sheath tip. The balloon was then retracted and using the fine-tuning wheel was centered on the valve. The valve was then advanced across the aortic arch using appropriate flexion of the catheter. The valve was carefully positioned across the aortic valve annulus. The Commander catheter was retracted using normal technique. Once final position of the valve has been confirmed by angiographic assessment, the valve is deployed while temporarily holding ventilation and during rapid ventricular pacing to maintain systolic blood pressure < 50 mmHg and pulse pressure < 10 mmHg. The balloon inflation is held for >3 seconds after reaching full deployment volume. Once the balloon has fully deflated the balloon is retracted into the ascending aorta and valve function is assessed using echocardiography. There is felt to be no paravalvular leak and no central aortic insufficiency.  The patient's hemodynamic recovery following valve deployment is good.  The deployment balloon and guidewire are both removed. Echo demostrated acceptable post-procedural gradients, stable mitral valve function, and no aortic insufficiency.   PROCEDURE COMPLETION:   The sheath was removed and femoral artery closure performed by Dr Burt Knack. Please see his separate report for details.  Protamine was administered once femoral arterial repair was complete.  The temporary pacemaker, pigtail catheters and femoral sheaths were removed with manual pressure used for hemostasis.   The patient tolerated the procedure well and is transported to the surgical intensive care in stable condition. There were no immediate intraoperative  complications. All sponge instrument and needle counts are verified correct at completion of the operation.   One unit of PRBC's was administered during the operation due to his baseline anemia and recent GI bleeding.  The patient received a total of 60 mL of intravenous contrast during the procedure.   Gaye Pollack, MD 09/09/2016 11:28 AM

## 2016-09-10 ENCOUNTER — Inpatient Hospital Stay (HOSPITAL_COMMUNITY): Payer: Medicare Other

## 2016-09-10 ENCOUNTER — Encounter (HOSPITAL_COMMUNITY): Payer: Self-pay | Admitting: Cardiovascular Disease

## 2016-09-10 DIAGNOSIS — I5032 Chronic diastolic (congestive) heart failure: Secondary | ICD-10-CM

## 2016-09-10 DIAGNOSIS — I350 Nonrheumatic aortic (valve) stenosis: Secondary | ICD-10-CM

## 2016-09-10 DIAGNOSIS — Z954 Presence of other heart-valve replacement: Secondary | ICD-10-CM

## 2016-09-10 LAB — BASIC METABOLIC PANEL
Anion gap: 7 (ref 5–15)
BUN: 11 mg/dL (ref 6–20)
CO2: 24 mmol/L (ref 22–32)
CREATININE: 1.11 mg/dL (ref 0.61–1.24)
Calcium: 8.3 mg/dL — ABNORMAL LOW (ref 8.9–10.3)
Chloride: 106 mmol/L (ref 101–111)
Glucose, Bld: 164 mg/dL — ABNORMAL HIGH (ref 65–99)
Potassium: 3.5 mmol/L (ref 3.5–5.1)
SODIUM: 137 mmol/L (ref 135–145)

## 2016-09-10 LAB — CBC
HCT: 27.9 % — ABNORMAL LOW (ref 39.0–52.0)
HEMOGLOBIN: 8.8 g/dL — AB (ref 13.0–17.0)
MCH: 29.3 pg (ref 26.0–34.0)
MCHC: 31.5 g/dL (ref 30.0–36.0)
MCV: 93 fL (ref 78.0–100.0)
PLATELETS: 114 10*3/uL — AB (ref 150–400)
RBC: 3 MIL/uL — AB (ref 4.22–5.81)
RDW: 15.9 % — ABNORMAL HIGH (ref 11.5–15.5)
WBC: 6.7 10*3/uL (ref 4.0–10.5)

## 2016-09-10 LAB — GLUCOSE, CAPILLARY
GLUCOSE-CAPILLARY: 100 mg/dL — AB (ref 65–99)
Glucose-Capillary: 147 mg/dL — ABNORMAL HIGH (ref 65–99)
Glucose-Capillary: 149 mg/dL — ABNORMAL HIGH (ref 65–99)
Glucose-Capillary: 130 mg/dL — ABNORMAL HIGH (ref 65–99)

## 2016-09-10 LAB — ECHOCARDIOGRAM COMPLETE
Height: 70 in
WEIGHTICAEL: 3236.8 [oz_av]

## 2016-09-10 LAB — MAGNESIUM: MAGNESIUM: 2 mg/dL (ref 1.7–2.4)

## 2016-09-10 MED ORDER — ONDANSETRON HCL 4 MG PO TABS: 4.0000 mg | ORAL_TABLET | Freq: Four times a day (QID) | ORAL | Status: DC | PRN

## 2016-09-10 MED ORDER — SODIUM CHLORIDE 0.9% FLUSH
3.0000 mL | Freq: Two times a day (BID) | INTRAVENOUS | Status: DC
  Administered 2016-09-10: 3 mL via INTRAVENOUS

## 2016-09-10 MED ORDER — BISACODYL 5 MG PO TBEC: 10.0000 mg | DELAYED_RELEASE_TABLET | Freq: Every day | ORAL | Status: DC | PRN

## 2016-09-10 MED ORDER — BISACODYL 10 MG RE SUPP: 10.0000 mg | Freq: Every day | RECTAL | Status: DC | PRN

## 2016-09-10 MED ORDER — ACETAMINOPHEN 325 MG PO TABS: 650.0000 mg | ORAL_TABLET | Freq: Four times a day (QID) | ORAL | Status: DC | PRN

## 2016-09-10 MED ORDER — LISINOPRIL 20 MG PO TABS
20.0000 mg | ORAL_TABLET | Freq: Every evening | ORAL | Status: DC
  Administered 2016-09-10: 20 mg via ORAL
  Filled 2016-09-10: qty 1

## 2016-09-10 MED ORDER — POTASSIUM CHLORIDE 20 MEQ/15ML (10%) PO SOLN: 20.0000 meq | Freq: Four times a day (QID) | ORAL | Status: AC

## 2016-09-10 MED ORDER — SODIUM CHLORIDE 0.9 % IV SOLN: 250.0000 mL | INTRAVENOUS | Status: DC | PRN

## 2016-09-10 MED ORDER — ASPIRIN EC 81 MG PO TBEC: 81.0000 mg | DELAYED_RELEASE_TABLET | Freq: Every day | ORAL | Status: DC

## 2016-09-10 MED ORDER — ONDANSETRON HCL 4 MG/2ML IJ SOLN: 4.0000 mg | Freq: Four times a day (QID) | INTRAMUSCULAR | Status: DC | PRN

## 2016-09-10 MED ORDER — INSULIN ASPART 100 UNIT/ML ~~LOC~~ SOLN
0.0000 [IU] | Freq: Three times a day (TID) | SUBCUTANEOUS | Status: DC
  Administered 2016-09-10: 2 [IU] via SUBCUTANEOUS

## 2016-09-10 MED ORDER — POTASSIUM CHLORIDE CRYS ER 20 MEQ PO TBCR
20.0000 meq | EXTENDED_RELEASE_TABLET | Freq: Four times a day (QID) | ORAL | Status: AC
  Administered 2016-09-10 (×2): 20 meq via ORAL
  Filled 2016-09-10 (×2): qty 1

## 2016-09-10 MED ORDER — FAMOTIDINE 20 MG PO TABS
20.0000 mg | ORAL_TABLET | Freq: Two times a day (BID) | ORAL | Status: DC
  Administered 2016-09-10: 20 mg via ORAL
  Filled 2016-09-10 (×2): qty 1

## 2016-09-10 MED ORDER — DOCUSATE SODIUM 100 MG PO CAPS
200.0000 mg | ORAL_CAPSULE | Freq: Every day | ORAL | Status: DC
  Administered 2016-09-10: 200 mg via ORAL
  Filled 2016-09-10 (×2): qty 2

## 2016-09-10 MED ORDER — MOVING RIGHT ALONG BOOK
Freq: Once | Status: AC
  Administered 2016-09-10: 19:00:00
  Filled 2016-09-10: qty 1

## 2016-09-10 MED ORDER — TRAMADOL HCL 50 MG PO TABS: 50.0000 mg | ORAL_TABLET | ORAL | Status: DC | PRN

## 2016-09-10 MED ORDER — SODIUM CHLORIDE 0.9% FLUSH: 3.0000 mL | INTRAVENOUS | Status: DC | PRN

## 2016-09-10 MED ORDER — ASPIRIN EC 81 MG PO TBEC
81.0000 mg | DELAYED_RELEASE_TABLET | Freq: Every day | ORAL | Status: DC
  Administered 2016-09-10 – 2016-09-11 (×2): 81 mg via ORAL
  Filled 2016-09-10 (×2): qty 1

## 2016-09-10 MED FILL — Phenylephrine HCl Inj 10 MG/ML: INTRAMUSCULAR | Qty: 2 | Status: AC

## 2016-09-10 MED FILL — Sodium Chloride IV Soln 0.9%: INTRAVENOUS | Qty: 250 | Status: AC

## 2016-09-10 MED FILL — Insulin Regular (Human) Inj 100 Unit/ML: INTRAMUSCULAR | Qty: 250 | Status: AC

## 2016-09-10 NOTE — Plan of Care (Signed)
Problem: Activity: Goal: Risk for activity intolerance will decrease Outcome: Progressing Pt to be OOB today  Problem: Bowel/Gastric: Goal: Gastrointestinal status for postoperative course will improve Outcome: Progressing Hypoactive BS  Problem: Cardiac: Goal: Hemodynamic stability will improve Outcome: Progressing Within parameters Goal: Ability to maintain an adequate cardiac output will improve Outcome: Progressing Within parameters  Problem: Respiratory: Goal: Respiratory status will improve Outcome: Progressing On room air Goal: Ability to tolerate decreased levels of ventilator support will improve Outcome: Completed/Met Date Met: 09/10/16 Extubated 1/30

## 2016-09-10 NOTE — Progress Notes (Signed)
Progress Note  Patient Name: Marvin Houston Date of Encounter: 09/10/2016  Primary Cardiologist: Dr Einar Gip  Subjective   No CP or dyspnea. Feels well.   Inpatient Medications    Scheduled Meds: . acetaminophen  1,000 mg Oral Q6H   Or  . acetaminophen (TYLENOL) oral liquid 160 mg/5 mL  1,000 mg Per Tube Q6H  . acetaminophen (TYLENOL) oral liquid 160 mg/5 mL  650 mg Per Tube Once   Or  . acetaminophen  650 mg Rectal Once  . aspirin EC  81 mg Oral Daily  . cefUROXime (ZINACEF)  IV  1.5 g Intravenous Q12H  . ferrous sulfate  325 mg Oral BID WC  . insulin aspart  0-15 Units Subcutaneous TID WC  . pantoprazole  40 mg Oral Daily  . pravastatin  80 mg Oral q1800  . sodium chloride flush  3 mL Intravenous Q12H   Continuous Infusions: . dexmedetomidine    . lactated ringers Stopped (09/10/16 0800)  . nitroGLYCERIN Stopped (09/09/16 1710)  . phenylephrine (NEO-SYNEPHRINE) Adult infusion     PRN Meds: sodium chloride, morphine injection, ondansetron (ZOFRAN) IV, oxyCODONE, sodium chloride flush, traMADol   Vital Signs    Vitals:   09/10/16 0800 09/10/16 0900 09/10/16 1000 09/10/16 1100  BP: 132/75 135/66    Pulse: (!) 56 (!) 54 61   Resp: 14 16 (!) 21   Temp:    97.8 F (36.6 C)  TempSrc:    Oral  SpO2: 98% 97% 99%   Weight:      Height:        Intake/Output Summary (Last 24 hours) at 09/10/16 1354 Last data filed at 09/10/16 1000  Gross per 24 hour  Intake           1666.5 ml  Output             2840 ml  Net          -1173.5 ml   Filed Weights   09/09/16 0609 09/10/16 0500  Weight: 203 lb (92.1 kg) 202 lb 4.8 oz (91.8 kg)    Telemetry    Normal sinus rhythm - Personally Reviewed  ECG    Sinus brady, HR 49 bpm, otherwise WNL - Personally Reviewed  Physical Exam   GEN: No acute distress.   Neck: No JVD Cardiac: RRR, soft SEM at the RUSB Respiratory: Clear to auscultation bilaterally. GI: Soft, nontender, non-distended  MS: trace pretibial edema; No  deformity. Groin sites clear Neuro:  Nonfocal  Psych: Normal affect   Labs    Chemistry Recent Labs Lab 09/04/16 0514 09/08/16 [id number]  09/09/16 0848 09/09/16 0920 09/09/16 1124 09/10/16 0355  NA 141 138  < > 140 141 143 137  K 4.3 4.1  < > 4.3 4.3 4.1 3.5  CL 111 106  < > 105 108  --  106  CO2 26 25  --   --   --   --  24  GLUCOSE 158* 205*  < > 177* 187* 146* 164*  BUN 13 13  < > 14 14  --  11  CREATININE 1.12 1.19  < > 0.90 0.90  --  1.11  CALCIUM 8.8* 9.4  --   --   --   --  8.3*  PROT  --  6.3*  --   --   --   --   --   ALBUMIN  --  3.7  --   --   --   --   --  AST  --  18  --   --   --   --   --   ALT  --  15*  --   --   --   --   --   ALKPHOS  --  54  --   --   --   --   --   BILITOT  --  1.4*  --   --   --   --   --   GFRNONAA >60 56*  --   --   --   --  >60  GFRAA >60 >60  --   --   --   --  >60  ANIONGAP 4* 7  --   --   --   --  7  < > = values in this interval not displayed.   Hematology Recent Labs Lab 09/08/16 0838  09/09/16 1124 09/09/16 1125 09/10/16 0355  WBC 4.7  --   --  5.3 6.7  RBC 3.29*  --   --  2.99* 3.00*  HGB 9.9*  < > 8.5* 9.1* 8.8*  HCT 30.5*  < > 25.0* 27.6* 27.9*  MCV 92.7  --   --  92.3 93.0  MCH 30.1  --   --  30.4 29.3  MCHC 32.5  --   --  33.0 31.5  RDW 15.8*  --   --  15.9* 15.9*  PLT 151  --   --  106* 114*  < > = values in this interval not displayed.  Cardiac EnzymesNo results for input(s): TROPONINI in the last 168 hours. No results for input(s): TROPIPOC in the last 168 hours.   BNPNo results for input(s): BNP, PROBNP in the last 168 hours.   DDimer No results for input(s): DDIMER in the last 168 hours.   Radiology    Dg Chest Port 1 View  Result Date: 09/10/2016 CLINICAL DATA:  Chest pain EXAM: PORTABLE CHEST 1 VIEW COMPARISON:  09/09/2016 FINDINGS: Cardiac shadow is stable. Changes of prior valve replacement are again identified and stable. Left jugular central line is again seen in overlying the proximal SVC. The  lungs are well aerated bilaterally. No pneumothorax or focal infiltrate is seen. No bony abnormality is noted. IMPRESSION: No acute abnormality noted. Electronically Signed   By: Inez Catalina M.D.   On: 09/10/2016 08:11   Dg Chest Port 1 View  Result Date: 09/09/2016 CLINICAL DATA:  Postop severe aortic stenosis. EXAM: PORTABLE CHEST 1 VIEW COMPARISON:  08/29/2016 FINDINGS: Changes of aortic valve repair. Cardiomegaly. Left central line is in place with the tip at the confluence of the innominate veins. No effusions or pneumothorax. Lungs are clear. IMPRESSION: Left central line tip in the upper SVC at the confluence of the innominate veins. No pneumothorax. Mild cardiomegaly. Electronically Signed   By: Rolm Baptise M.D.   On: 09/09/2016 12:08    Cardiac Studies   POD #1 echo pending  Patient Profile     83 y.o. male POD#1 from TAVR  Assessment & Plan    1. Severe aortic stenosis, postoperative day #1 from TAVR: The patient is progressing well, he will be transferred to a stepdown bed with anticipated discharge tomorrow. Plan to treat him with aspirin alone in the setting of recurrent GI bleeding.  2. Anemia, acute on chronic: Patient received 1 unit of packed red blood cells in the operating room yesterday. His hemoglobin is stable this morning. No signs of active bleeding.  3. Type  2 diabetes: Treated with sliding scale insulin.  4. GI bleeding: Patient with colonic AVMs. He will undergo colonoscopy and treatment of his AVMs once he has recovered from TAVR.  Marvin James, MD  09/10/2016, 1:54 PM

## 2016-09-10 NOTE — Progress Notes (Signed)
1 Day Post-Op Procedure(s) (LRB): TRANSCATHETER AORTIC VALVE REPLACEMENT, TRANSFEMORAL (Bilateral) TRANSESOPHAGEAL ECHOCARDIOGRAM (TEE) (N/A) Subjective: No complaints  Objective: Vital signs in last 24 hours: Temp:  [97.2 F (36.2 C)-99.5 F (37.5 C)] 98.3 F (36.8 C) (01/31 0700) Pulse Rate:  [26-113] 56 (01/31 0800) Cardiac Rhythm: Sinus bradycardia;Normal sinus rhythm (01/31 0745) Resp:  [11-28] 14 (01/31 0800) BP: (111-153)/(58-75) 132/75 (01/31 0800) SpO2:  [95 %-100 %] 98 % (01/31 0800) Arterial Line BP: (148-165)/(41-72) 156/54 (01/30 1545) Weight:  [91.8 kg (202 lb 4.8 oz)] 91.8 kg (202 lb 4.8 oz) (01/31 0500)  Hemodynamic parameters for last 24 hours:    Intake/Output from previous day: 01/30 0701 - 01/31 0700 In: 3882 [P.O.:480; I.V.:2917; Blood:335; IV Piggyback:150] Out: 3090 [Urine:3015; Blood:75] Intake/Output this shift: Total I/O In: 40 [I.V.:40] Out: 35 [Urine:35]  General appearance: alert and cooperative Neurologic: intact Heart: regular rate and rhythm, S1, S2 normal, no murmur, click, rub or gallop Lungs: clear to auscultation bilaterally Wound: groin sites ok  Lab Results:  Recent Labs  09/09/16 1125 09/10/16 0355  WBC 5.3 6.7  HGB 9.1* 8.8*  HCT 27.6* 27.9*  PLT 106* 114*   BMET:  Recent Labs  09/08/16 0838  09/09/16 0920 09/09/16 1124 09/10/16 0355  NA 138  < > 141 143 137  K 4.1  < > 4.3 4.1 3.5  CL 106  < > 108  --  106  CO2 25  --   --   --  24  GLUCOSE 205*  < > 187* 146* 164*  BUN 13  < > 14  --  11  CREATININE 1.19  < > 0.90  --  1.11  CALCIUM 9.4  --   --   --  8.3*  < > = values in this interval not displayed.  PT/INR:  Recent Labs  09/09/16 1125  LABPROT 16.0*  INR 1.27   ABG    Component Value Date/Time   PHART 7.363 09/09/2016 1128   HCO3 25.0 09/09/2016 1128   TCO2 26 09/09/2016 1128   ACIDBASEDEF 3.0 (H) 09/09/2016 0927   O2SAT 99.0 09/09/2016 1128   CBG (last 3)   Recent Labs  09/09/16 1615  09/09/16 2140 09/10/16 0743  GLUCAP 103* 159* 147*   CLINICAL DATA:  Chest pain  EXAM: PORTABLE CHEST 1 VIEW  COMPARISON:  09/09/2016  FINDINGS: Cardiac shadow is stable. Changes of prior valve replacement are again identified and stable. Left jugular central line is again seen in overlying the proximal SVC. The lungs are well aerated bilaterally. No pneumothorax or focal infiltrate is seen. No bony abnormality is noted.  IMPRESSION: No acute abnormality noted.   Electronically Signed   By: Inez Catalina M.D.   On: 09/10/2016 08:11   Assessment/Plan: S/P Procedure(s) (LRB): TRANSCATHETER AORTIC VALVE REPLACEMENT, TRANSFEMORAL (Bilateral) TRANSESOPHAGEAL ECHOCARDIOGRAM (TEE) (N/A)  He is hemodynamically stable in sinus rhythm.  HR 50's so will not use beta blocker. Resume lisinopril for HTN.  Recent GI bleed suspected due to colonic AVM's. Will plan to use ASA 81 mg alone for valve.  Expected acute on chronic postop blood loss anemia: observe.  DM: glucose under good control. Continue SSI for now and resume metformin at discharge.  Plan 2D echo today   Transfer to 2W, mobilize and plan home tomorrow.   LOS: 1 day    Gaye Pollack 09/10/2016

## 2016-09-10 NOTE — Care Management Note (Signed)
Case Management Note  Patient Details  Name: ARBER DOOLING MRN: [id number] Date of Birth: [birth year]  Subjective/Objective:   S/p TAVR                 Action/Plan:  PTA independent from home with wife.  CM will continue to follow for discharge needs   Expected Discharge Date:                  Expected Discharge Plan:     In-House Referral:     Discharge planning Services  CM Consult  Post Acute Care Choice:    Choice offered to:     DME Arranged:    DME Agency:     HH Arranged:    HH Agency:     Status of Service:  In process, will continue to follow  If discussed at Long Length of Stay Meetings, dates discussed:    Additional Comments:  Maryclare Labrador, RN 09/10/2016, 9:52 AM

## 2016-09-10 NOTE — Progress Notes (Signed)
Echocardiogram 2D Echocardiogram has been performed.  Aggie Cosier 09/10/2016, 2:58 PM

## 2016-09-11 LAB — BASIC METABOLIC PANEL
Anion gap: 5 (ref 5–15)
BUN: 11 mg/dL (ref 6–20)
CHLORIDE: 111 mmol/L (ref 101–111)
CO2: 26 mmol/L (ref 22–32)
Calcium: 8.9 mg/dL (ref 8.9–10.3)
Creatinine, Ser: 1.2 mg/dL (ref 0.61–1.24)
GFR calc Af Amer: >60 mL/min (ref >60)
GFR calc non Af Amer: 55 mL/min — ABNORMAL LOW (ref >60)
GLUCOSE: 124 mg/dL — AB (ref 65–99)
POTASSIUM: 4.4 mmol/L (ref 3.5–5.1)
SODIUM: 142 mmol/L (ref 135–145)

## 2016-09-11 LAB — CBC
HEMATOCRIT: 29.1 % — AB (ref 39.0–52.0)
HEMOGLOBIN: 9.3 g/dL — AB (ref 13.0–17.0)
MCH: 29.7 pg (ref 26.0–34.0)
MCHC: 32 g/dL (ref 30.0–36.0)
MCV: 93 fL (ref 78.0–100.0)
Platelets: 102 10*3/uL — ABNORMAL LOW (ref 150–400)
RBC: 3.13 MIL/uL — AB (ref 4.22–5.81)
RDW: 15.9 % — ABNORMAL HIGH (ref 11.5–15.5)
WBC: 5.6 10*3/uL (ref 4.0–10.5)

## 2016-09-11 LAB — GLUCOSE, CAPILLARY: GLUCOSE-CAPILLARY: 113 mg/dL — AB (ref 65–99)

## 2016-09-11 MED ORDER — ASPIRIN 81 MG PO TBEC: 81.0000 mg | DELAYED_RELEASE_TABLET | Freq: Every day | ORAL | Status: DC

## 2016-09-11 NOTE — Progress Notes (Signed)
DC instructions reviewed with patient and spouse at bedside, no questions from patient or spouse. Walked patient to [facility]	[place] entrance.   Rowe Pavy, RN

## 2016-09-11 NOTE — Discharge Summary (Signed)
Discharge Summary    Patient ID: Marvin Houston,  MRN: [id number], DOB/AGE: [birth year] 83 y.o.  Admit date: 09/09/2016 Discharge date: 09/11/2016  Primary Care Provider: Jani Gravel Primary Cardiologist: Dr. Nadyne Coombes, Dr. Burt Knack preformed TAVR   Discharge Diagnoses    Active Problems:   Severe aortic stenosis   Allergies Allergies  Allergen Reactions  . No Known Allergies     Diagnostic Studies/Procedures    TRANSCATHETER HEART VALVE DEPLOYMENT:  An Edwards Sapien 3 transcatheter heart valve (size 29 mm, model #9600TFX, serial [id number]) was prepared and crimped per manufacturer's guidelines, and the proper orientation of the valve is confirmed on the [employer] delivery system. The valve was advanced through the introducer sheath using normal technique until in an appropriate position in the abdominal aorta beyond the sheath tip. The balloon was then retracted and using the fine-tuning wheel was centered on the valve. The valve was then advanced across the aortic arch using appropriate flexion of the catheter. The valve was carefully positioned across the aortic valve annulus. The Commander catheter was retracted using normal technique. Once final position of the valve has been confirmed by angiographic assessment, the valve is deployed while temporarily holding ventilation and during rapid ventricular pacing to maintain systolic blood pressure < 50 mmHg and pulse pressure < 10 mmHg. The balloon inflation is held for >3 seconds after reaching full deployment volume. Once the balloon has fully deflated the balloon is retracted into the ascending aorta and valve function is assessed using echocardiography. There is felt to be no paravalvular leak and no central aortic insufficiency.  The patient's hemodynamic recovery following valve deployment is good.  The deployment balloon and guidewire are both removed. Echo demostrated acceptable post-procedural gradients, stable mitral valve function,  and no aortic insufficiency.   PROCEDURE COMPLETION:  The sheath was removed and femoral artery closure is performed using the 2 previously deployed Perclose devices.  Protamine was administered once femoral arterial repair was complete. The temporary pacemaker, pigtail catheters and femoral sheaths were removed with manual pressure used for hemostasis.   The patient tolerated the procedure well and is transported to the surgical intensive care in stable condition. There were no immediate intraoperative complications. All sponge instrument and needle counts are verified correct at completion of the operation.   The patient received a total of 60 mL of intravenous contrast during the procedure.  The patient who has baseline anemia received one unit of PRBC's during the procedure.   _____________   History of Present Illness     83 yo gentleman presenting today for TAVR surgery. He has DM, HTN, severe aortic stenosis, and recurrent GI bleeding from large colonic AVM's. He has developed progressive and now severe symptomatic aortic stenosis and presents for TAVR after undergoing extensive evaluation with imaging studies, cardiac cath, echo, CTA, and cardiac surgical evaluation.   During the course of the patient's preoperative work up they have been evaluated comprehensively by a multidisciplinary team of specialists coordinated through the [facility] in the [facility].  They have been demonstrated to suffer from symptomatic severe aortic stenosis as noted above. The patient has been counseled extensively as to the relative risks and benefits of all options for the treatment of severe aortic stenosis including long term medical therapy, conventional surgery for aortic valve replacement, and transcatheter aortic valve replacement.  The patient has been independently evaluated by two cardiac surgeons including Dr. Cyndia Bent and Dr. Servando Snare, and  they are felt to be at moderate risk for conventional surgical aortic valve replacement. Based upon review of all of the patient's preoperative diagnostic tests they are felt to be candidate for transcatheter aortic valve replacement using the transfemoral approach as an alternative to high risk conventional surgery.    Following the decision to proceed with transcatheter aortic valve replacement, a discussion has been held regarding what types of management strategies would be attempted intraoperatively in the event of life-threatening complications, including whether or not the patient would be considered a candidate for the use of cardiopulmonary bypass and/or conversion to open sternotomy for attempted surgical intervention.  The patient has been advised of a variety of complications that might develop peculiar to this approach including but not limited to risks of death, stroke, paravalvular leak, aortic dissection or other major vascular complications, aortic annulus rupture, device embolization, cardiac rupture or perforation, acute myocardial infarction, arrhythmia, heart block or bradycardia requiring permanent pacemaker placement, congestive heart failure, respiratory failure, renal failure, pneumonia, infection, other late complications related to structural valve deterioration or migration, or other complications that might ultimately cause a temporary or permanent loss of functional independence or other long term morbidity.  The patient provides full informed consent for the procedure as described and all questions were answered preoperatively.   Hospital Course     1. Severe aortic stenosis, postoperative day #2 from TAVR: echo reviewed - transcatheter heart valve with good hemodynamic profile based on doppler findings, and no paravalvular AI. Continue ASA alone in setting of recurrent GI bleeding.  2. Anemia, acute on chronic: HgB stable at 9.3 mg/dL this am. Continue iron. Outpatient GI  evaluation for treatment of colonic AVM's.   3. Type 2 diabetes: Treated with sliding scale insulin. CBG's 100-149 on SSI. Resume metformin at discharge.   4. GI bleeding: Patient with colonic AVMs. He will undergo colonoscopy and treatment of his AVMs once he has recovered from TAVR.  5. Thrombocytopenia, mild: expected post-TAVR.  _____________  Discharge Vitals Blood pressure (!) 147/74, pulse (!) 53, temperature 97.3 F (36.3 C), temperature source Oral, resp. rate 18, height 5\' 10"  (1.778 m), weight 201 lb 8 oz (91.4 kg), SpO2 98 %.  Filed Weights   09/09/16 0609 09/10/16 0500 09/11/16 0500  Weight: 203 lb (92.1 kg) 202 lb 4.8 oz (91.8 kg) 201 lb 8 oz (91.4 kg)    Labs & Radiologic Studies     CBC  Recent Labs  09/10/16 0355 09/11/16 0337  WBC 6.7 5.6  HGB 8.8* 9.3*  HCT 27.9* 29.1*  MCV 93.0 93.0  PLT 114* [id number]*   Basic Metabolic Panel  Recent Labs  09/10/16 0355 09/11/16 0337  NA 137 142  K 3.5 4.4  CL 106 111  CO2 24 26  GLUCOSE 164* 124*  BUN 11 11  CREATININE 1.11 1.20  CALCIUM 8.3* 8.9  MG 2.0  --     Dg Chest 2 View  Result Date: 08/29/2016 CLINICAL DATA:  Severe aortic stenosis.  Preop EXAM: CHEST  2 VIEW COMPARISON:  CT chest 08/25/2000 FINDINGS: Heart size within normal limits. Atherosclerotic calcification aortic arch. Vascularity normal. Lungs are clear without infiltrate effusion or mass lesion. No skeletal abnormality. IMPRESSION: No active cardiopulmonary disease. Atherosclerotic aortic arch. Electronically Signed   By: Franchot Gallo M.D.   On: 08/29/2016 10:17   Ct Coronary Morph W/cta Cor W/score W/ca W/cm &/or Wo/cm  Addendum Date: 08/25/2016   ADDENDUM REPORT: 08/25/2016 18:18 CLINICAL DATA:  83 year old male with  severe aortic stenosis. EXAM: Cardiac TAVR CT TECHNIQUE: The patient was scanned on a Philips 256 scanner. A 120 kV retrospective scan was triggered in the descending thoracic aorta at 111 HU's. Gantry rotation speed was  270 msecs and collimation was .9 mm. No beta blockade or nitro were given. The 3D data set was reconstructed in 5% intervals of the R-R cycle. Systolic and diastolic phases were analyzed on a dedicated work station using MPR, MIP and VRT modes. The patient received 80 cc of contrast. FINDINGS: Aortic Valve: Functionally bicuspid aortic valve with non-separated left and right coronary cusp. There is severe calcification and thickening of the leaflets with almost no calcium extending into the LVOT. Aorta: Mild dilatation of the sinotubular junction and upper normal size of the ascending aorta. Mild diffuse calcifications of the aortic arch and descending aorta. Sinotubular Junction:  35 x 34 mm Ascending Thoracic Aorta:  40 x 40 mm Aortic Arch:  31 x 30 mm Descending Thoracic Aorta:  30 x 29 mm Sinus of Valsalva Measurements: Non-coronary:  39 mm Right -coronary:  38 mm Left -coronary:  39 mm Coronary Artery Height above Annulus: Left Main:  14 mm Right Coronary:  13 mm Virtual Basal Annulus Measurements: Maximum/Minimum Diameter:  32 x 26 mm Perimeter:  94 mm Area:  666 mm2 Coronary Arteries:  Normal origin.  Right dominance. Optimum Fluoroscopic Angle for Delivery:  RAO 2 CRA 1 IMPRESSION: 1. Functionally bicuspid aortic valve with non-separated left and right coronary cusp. There is severe calcification and thickening of the leaflets with almost no calcium extending into the LVOT. Annular measurements are at upper limit for the 29 mm Edwards-SAPIEN 3 valve. 2. Mild dilatation of the sinotubular junction and upper normal size of the ascending aorta measuring 40 mm. 3.  Sufficient annulus to coronary distance. 4. Optimum Fluoroscopic Angle for Delivery:  RAO 2 CRA 1 Ena Dawley Electronically Signed   By: Ena Dawley   On: 08/25/2016 18:18   Result Date: 08/25/2016 EXAM: OVER-READ INTERPRETATION  CT CHEST The following report is an over-read performed by radiologist Dr. Rebekah [facility]field [facility] on 08/25/2016. This over-read does not include interpretation of cardiac or coronary anatomy or pathology. The coronary CTA interpretation by the cardiologist is attached. COMPARISON:  None. FINDINGS: Extracardiac findings will be described on separate dictation for contemporaneously obtained CTA of the chest, abdomen and pelvis. IMPRESSION: Please see separate dictation for contemporaneously obtained CTA of the chest, abdomen and pelvis dated 08/25/2016 for full description of relevant extracardiac findings. Electronically Signed: By: Vinnie Langton M.D. On: 08/25/2016 11:36   Dg Chest Port 1 View  Result Date: 09/10/2016 CLINICAL DATA:  Chest pain EXAM: PORTABLE CHEST 1 VIEW COMPARISON:  09/09/2016 FINDINGS: Cardiac shadow is stable. Changes of prior valve replacement are again identified and stable. Left jugular central line is again seen in overlying the proximal SVC. The lungs are well aerated bilaterally. No pneumothorax or focal infiltrate is seen. No bony abnormality is noted. IMPRESSION: No acute abnormality noted. Electronically Signed   By: Inez Catalina M.D.   On: 09/10/2016 08:11   Dg Chest Port 1 View  Result Date: 09/09/2016 CLINICAL DATA:  Postop severe aortic stenosis. EXAM: PORTABLE CHEST 1 VIEW COMPARISON:  08/29/2016 FINDINGS: Changes of aortic valve repair. Cardiomegaly. Left central line is in place with the tip at the confluence of the innominate veins. No effusions or pneumothorax. Lungs are clear. IMPRESSION: Left central line tip in the upper SVC at the  confluence of the innominate veins. No pneumothorax. Mild cardiomegaly. Electronically Signed   By: Rolm Baptise M.D.   On: 09/09/2016 12:08   Ct Angio Chest Aorta W/cm &/or Wo/cm  Result Date: 08/25/2016 CLINICAL DATA:  83 year old male with history of severe aortic stenosis. Preprocedural study prior to potential transcatheter aortic valve replacement (TAVR) procedure. EXAM: CT ANGIOGRAPHY CHEST, ABDOMEN AND  PELVIS TECHNIQUE: Multidetector CT imaging through the chest, abdomen and pelvis was performed using the standard protocol during bolus administration of intravenous contrast. Multiplanar reconstructed images and MIPs were obtained and reviewed to evaluate the vascular anatomy. CONTRAST:  67 mL of Isovue 370. COMPARISON:  CT the abdomen and pelvis 08/24/2009. FINDINGS: CTA CHEST FINDINGS Cardiovascular: Heart size is normal. There is no significant pericardial fluid, thickening or pericardial calcification. There is aortic atherosclerosis, as well as atherosclerosis of the great vessels of the mediastinum and the coronary arteries, including calcified atherosclerotic plaque in the left main, left anterior descending, left circumflex and right coronary arteries. Severe thickening calcifications of the aortic valve. Mediastinum/Lymph Nodes: No pathologically enlarged mediastinal or hilar lymph nodes. Esophagus is unremarkable in appearance. No axillary lymphadenopathy. Lungs/Pleura: There are several scattered 2-3 mm pulmonary nodules noted throughout the lungs bilaterally, highly nonspecific, but statistically likely benign. No other larger more suspicious appearing pulmonary nodules or masses are noted. No acute consolidative airspace disease. No pleural effusions. Musculoskeletal/Soft Tissues: There are no aggressive appearing lytic or blastic lesions noted in the visualized portions of the skeleton. CTA ABDOMEN AND PELVIS FINDINGS Hepatobiliary: No suspicious cystic or solid hepatic lesions. No intra or extrahepatic biliary ductal dilatation. Small calcified gallstone measuring 5 mm lying dependently in the gallbladder. No findings to suggest an acute cholecystitis at this time. Pancreas: No pancreatic mass. No pancreatic ductal dilatation. No pancreatic or peripancreatic fluid or inflammatory changes. Spleen: Unremarkable. Adrenals/Urinary Tract: Subcentimeter low-attenuation lesions in the lower poles of both  kidneys are too small to definitively characterize, but are statistically likely tiny cysts. No suspicious renal lesions. No hydroureteronephrosis. Bilateral adrenal glands are normal in appearance. Urinary bladder is thickened and heavily trabeculated with multiple small bladder diverticulae. Stomach/Bowel: The appearance of the stomach is normal. There is no pathologic dilatation of small bowel or colon. Normal appendix. Vascular/Lymphatic: Aortic atherosclerosis, with vascular findings and measurements pertinent to potential TAVR procedure, as detailed below. No aneurysm or dissection noted in the abdominal or pelvic vasculature. Celiac axis, superior mesenteric artery and inferior mesenteric artery are all widely patent without definite hemodynamically significant stenosis. Two left-sided and 1 right-sided renal arteries are all widely patent. No lymphadenopathy noted in the abdomen or pelvis. Reproductive: Prostate gland is markedly enlarged with median lobe hypertrophy, measuring 7.4 x 6.6 x 7.8 cm. Seminal vesicles are unremarkable in appearance. Other: No significant volume of ascites.  No pneumoperitoneum. Musculoskeletal: There are no aggressive appearing lytic or blastic lesions noted in the visualized portions of the skeleton. VASCULAR MEASUREMENTS PERTINENT TO TAVR: AORTA: Minimal Aortic Diameter -  17 x 17 mm Severity of Aortic Calcification -  moderate to severe RIGHT PELVIS: Right Common Iliac Artery - Minimal Diameter - 10.6 x 8.4 mm Tortuosity - moderate Calcification - mild to moderate Right External Iliac Artery - Minimal Diameter - 9.1 x 7.2 mm Tortuosity - moderate to severe Calcification - mild Right Common Femoral Artery - Minimal Diameter - 8.5 x 6.6  mm Tortuosity - mild Calcification - mild LEFT PELVIS: Left Common Iliac Artery - Minimal Diameter - 7.4 x 6.8 mm Tortuosity -  mild Calcification - mild to moderate Left External Iliac Artery - Minimal Diameter - 8.3 x 8.3 mm Tortuosity -  moderate to severe Calcification - none Left Common Femoral Artery - Minimal Diameter - 9.5 x 9.4 mm Tortuosity - mild Calcification - minimal Review of the MIP images confirms the above findings. IMPRESSION: 1. Vascular findings and measurements pertinent to potential TAVR procedure, as detailed above. This patient appears to have suitable pelvic arterial access bilaterally. 2. Severe thickening and calcification of the aortic valve, compatible with the reported clinical history of severe aortic stenosis. 3. Aortic atherosclerosis, in addition to left main and 3 vessel coronary artery disease. 4. Severely thickened and heavily trabeculated urinary bladder with multiple small bladder wall diverticulae, presumably from chronic bladder outlet obstruction related to the patient's enlarged prostate gland. 5. Multiple tiny 2-3 mm pulmonary nodules scattered throughout the lungs bilaterally, highly nonspecific but statistically likely benign. No follow-up needed if patient is low-risk (and has no known or suspected primary neoplasm). Non-contrast chest CT can be considered in 12 months if patient is high-risk. This recommendation follows the consensus statement: Guidelines for Management of Incidental Pulmonary Nodules Detected on CT Images: From the Fleischner Society 2017; Radiology 2017; 284:228-243. 6. Additional incidental findings, as above. Electronically Signed   By: Vinnie Langton M.D.   On: 08/25/2016 12:38   Ct Angio Abd/pel W/ And/or W/o  Result Date: 08/25/2016 CLINICAL DATA:  83 year old male with history of severe aortic stenosis. Preprocedural study prior to potential transcatheter aortic valve replacement (TAVR) procedure. EXAM: CT ANGIOGRAPHY CHEST, ABDOMEN AND PELVIS TECHNIQUE: Multidetector CT imaging through the chest, abdomen and pelvis was performed using the standard protocol during bolus administration of intravenous contrast. Multiplanar reconstructed images and MIPs were obtained and  reviewed to evaluate the vascular anatomy. CONTRAST:  67 mL of Isovue 370. COMPARISON:  CT the abdomen and pelvis 08/24/2009. FINDINGS: CTA CHEST FINDINGS Cardiovascular: Heart size is normal. There is no significant pericardial fluid, thickening or pericardial calcification. There is aortic atherosclerosis, as well as atherosclerosis of the great vessels of the mediastinum and the coronary arteries, including calcified atherosclerotic plaque in the left main, left anterior descending, left circumflex and right coronary arteries. Severe thickening calcifications of the aortic valve. Mediastinum/Lymph Nodes: No pathologically enlarged mediastinal or hilar lymph nodes. Esophagus is unremarkable in appearance. No axillary lymphadenopathy. Lungs/Pleura: There are several scattered 2-3 mm pulmonary nodules noted throughout the lungs bilaterally, highly nonspecific, but statistically likely benign. No other larger more suspicious appearing pulmonary nodules or masses are noted. No acute consolidative airspace disease. No pleural effusions. Musculoskeletal/Soft Tissues: There are no aggressive appearing lytic or blastic lesions noted in the visualized portions of the skeleton. CTA ABDOMEN AND PELVIS FINDINGS Hepatobiliary: No suspicious cystic or solid hepatic lesions. No intra or extrahepatic biliary ductal dilatation. Small calcified gallstone measuring 5 mm lying dependently in the gallbladder. No findings to suggest an acute cholecystitis at this time. Pancreas: No pancreatic mass. No pancreatic ductal dilatation. No pancreatic or peripancreatic fluid or inflammatory changes. Spleen: Unremarkable. Adrenals/Urinary Tract: Subcentimeter low-attenuation lesions in the lower poles of both kidneys are too small to definitively characterize, but are statistically likely tiny cysts. No suspicious renal lesions. No hydroureteronephrosis. Bilateral adrenal glands are normal in appearance. Urinary bladder is thickened and  heavily trabeculated with multiple small bladder diverticulae. Stomach/Bowel: The appearance of the stomach is normal. There is no pathologic dilatation of small bowel or colon. Normal appendix. Vascular/Lymphatic: Aortic atherosclerosis, with vascular findings and measurements pertinent to  potential TAVR procedure, as detailed below. No aneurysm or dissection noted in the abdominal or pelvic vasculature. Celiac axis, superior mesenteric artery and inferior mesenteric artery are all widely patent without definite hemodynamically significant stenosis. Two left-sided and 1 right-sided renal arteries are all widely patent. No lymphadenopathy noted in the abdomen or pelvis. Reproductive: Prostate gland is markedly enlarged with median lobe hypertrophy, measuring 7.4 x 6.6 x 7.8 cm. Seminal vesicles are unremarkable in appearance. Other: No significant volume of ascites.  No pneumoperitoneum. Musculoskeletal: There are no aggressive appearing lytic or blastic lesions noted in the visualized portions of the skeleton. VASCULAR MEASUREMENTS PERTINENT TO TAVR: AORTA: Minimal Aortic Diameter -  17 x 17 mm Severity of Aortic Calcification -  moderate to severe RIGHT PELVIS: Right Common Iliac Artery - Minimal Diameter - 10.6 x 8.4 mm Tortuosity - moderate Calcification - mild to moderate Right External Iliac Artery - Minimal Diameter - 9.1 x 7.2 mm Tortuosity - moderate to severe Calcification - mild Right Common Femoral Artery - Minimal Diameter - 8.5 x 6.6  mm Tortuosity - mild Calcification - mild LEFT PELVIS: Left Common Iliac Artery - Minimal Diameter - 7.4 x 6.8 mm Tortuosity - mild Calcification - mild to moderate Left External Iliac Artery - Minimal Diameter - 8.3 x 8.3 mm Tortuosity - moderate to severe Calcification - none Left Common Femoral Artery - Minimal Diameter - 9.5 x 9.4 mm Tortuosity - mild Calcification - minimal Review of the MIP images confirms the above findings. IMPRESSION: 1. Vascular findings and  measurements pertinent to potential TAVR procedure, as detailed above. This patient appears to have suitable pelvic arterial access bilaterally. 2. Severe thickening and calcification of the aortic valve, compatible with the reported clinical history of severe aortic stenosis. 3. Aortic atherosclerosis, in addition to left main and 3 vessel coronary artery disease. 4. Severely thickened and heavily trabeculated urinary bladder with multiple small bladder wall diverticulae, presumably from chronic bladder outlet obstruction related to the patient's enlarged prostate gland. 5. Multiple tiny 2-3 mm pulmonary nodules scattered throughout the lungs bilaterally, highly nonspecific but statistically likely benign. No follow-up needed if patient is low-risk (and has no known or suspected primary neoplasm). Non-contrast chest CT can be considered in 12 months if patient is high-risk. This recommendation follows the consensus statement: Guidelines for Management of Incidental Pulmonary Nodules Detected on CT Images: From the Fleischner Society 2017; Radiology 2017; 284:228-243. 6. Additional incidental findings, as above. Electronically Signed   By: Vinnie Langton M.D.   On: 08/25/2016 12:38    Disposition   Pt is being discharged home today in good condition.  Follow-up Plans & Appointments    Follow-up Information    Sherren Mocha, MD Follow up on 10/03/2016.   Specialty:  Cardiology Why:  at 8:30am for echocardiogram, and 9:30 am for an appt. with Dr. Burt Knack.  Contact information: [id number] [place] [address] [phone]        Adrian Prows, MD Follow up on 11/05/2016.   Specialty:  Cardiology Why:  at 3:45pm for follow up with Dr. Einar Gip.  Contact information: [address] [place] [address] [phone]          Discharge Instructions    Diet - low sodium heart healthy    Complete by:  As directed    Increase activity slowly    Complete by:  As  directed       Discharge Medications   Current Discharge Medication  List    START taking these medications   Details  aspirin EC 81 MG EC tablet Take 1 tablet (81 mg total) by mouth daily.      CONTINUE these medications which have NOT CHANGED   Details  Cyanocobalamin (VITAMIN B-12 PO) Take 1 tablet by mouth every evening.     ferrous sulfate 325 (65 FE) MG tablet Take 1 tablet (325 mg total) by mouth 2 (two) times daily with a meal. Qty: 60 tablet, Refills: 12    lisinopril (PRINIVIL,ZESTRIL) 40 MG tablet Take 40 mg by mouth every evening.     lovastatin (MEVACOR) 40 MG tablet Take 40 mg by mouth 2 (two) times a week. Mon and Tues    metFORMIN (GLUCOPHAGE) 500 MG tablet Take 500 mg by mouth 2 (two) times daily with a meal.     !! Multiple Vitamin (MULTIVITAMIN WITH MINERALS) TABS tablet Take 1 tablet by mouth every evening.     !! Multiple Vitamins-Minerals (VISION VITAMINS PO) Take 1 tablet by mouth every evening.     psyllium (METAMUCIL) 58.6 % powder Take 1 packet by mouth daily.    sildenafil (VIAGRA) 100 MG tablet Take 100 mg by mouth once as needed for erectile dysfunction.     !! - Potential duplicate medications found. Please discuss with provider.          Outstanding Labs/Studies   Duration of Discharge Encounter   Greater than 30 minutes including physician time.  Signed, Arbutus Leas NP 09/11/2016, 10:51 AM

## 2016-09-11 NOTE — Care Management Note (Signed)
Case Management Note  Patient Details  Name: KYLLIAN ERRANTE MRN: [id number] Date of Birth: [birth year]  Subjective/Objective:   S/p TAVR                 Action/Plan:  PTA independent from home with wife.  CM will continue to follow for discharge needs   Expected Discharge Date:  09/11/16               Expected Discharge Plan:     In-House Referral:     Discharge planning Services  CM Consult  Post Acute Care Choice:    Choice offered to:     DME Arranged:    DME Agency:     HH Arranged:    HH Agency:     Status of Service:  In process, will continue to follow  If discussed at Long Length of Stay Meetings, dates discussed:   Pt to discharge home in the care of wife.  NO CM needs identified prior to discharge Additional Comments:  Maryclare Labrador, RN 09/11/2016, 11:57 AM

## 2016-09-11 NOTE — Progress Notes (Signed)
CARDIAC REHAB PHASE I   PRE:  Rate/Rhythm: 62SR  BP:  Supine:   Sitting: 144/76  Standing:    SaO2: 98%RA  MODE:  Ambulation: 600 ft   POST:  Rate/Rhythm: 94  BP:  Supine:   Sitting: 146/64  Standing:    SaO2: 98%RA 1100-1145 Pt walked 600 ft on RA with hand held asst with steady gait. Tolerated well. Education completed with pt with wife present. Reviewed ex ed, IS, CRP 2 and watching sodium. Gave heart healthy and low sodium diets. Referring to [place] Phase 2.   Graylon Good, RN BSN  09/11/2016 11:41 AM

## 2016-09-11 NOTE — Progress Notes (Signed)
Progress Note  Patient Name: Marvin Houston Date of Encounter: 09/11/2016  Primary Cardiologist: Dr Einar Gip  Subjective   Feels well. No chest pain or dyspnea. No complaints this am.   Inpatient Medications    Scheduled Meds: . aspirin EC  81 mg Oral Daily  . docusate sodium  200 mg Oral Daily  . famotidine  20 mg Oral BID  . ferrous sulfate  325 mg Oral BID WC  . insulin aspart  0-24 Units Subcutaneous TID AC & HS  . lisinopril  20 mg Oral QPM  . pravastatin  80 mg Oral q1800  . sodium chloride flush  3 mL Intravenous Q12H   Continuous Infusions:  PRN Meds: sodium chloride, acetaminophen, bisacodyl **OR** bisacodyl, ondansetron **OR** ondansetron (ZOFRAN) IV, sodium chloride flush, traMADol   Vital Signs    Vitals:   09/11/16 0300 09/11/16 0400 09/11/16 0500 09/11/16 0600  BP: (!) 150/72 138/61 (!) 148/75 122/62  Pulse: (!) 56 (!) 55 (!) 58 (!) 55  Resp:  11 15 16   Temp:  98.6 F (37 C)    TempSrc:  Oral    SpO2: 95% 98% 95% 97%  Weight:   201 lb 8 oz (91.4 kg)   Height:        Intake/Output Summary (Last 24 hours) at 09/11/16 0715 Last data filed at 09/11/16 0600  Gross per 24 hour  Intake              510 ml  Output             2485 ml  Net            -1975 ml   Filed Weights   09/09/16 0609 09/10/16 0500 09/11/16 0500  Weight: 203 lb (92.1 kg) 202 lb 4.8 oz (91.8 kg) 201 lb 8 oz (91.4 kg)    Telemetry    Sinus rhythm - Personally Reviewed  ECG    Physical Exam   GEN: No acute distress.   Neck: No JVD Cardiac: RRR, 2/6 SEM at the RUSB  Respiratory: Clear to auscultation bilaterally. GI: Soft, nontender, non-distended  MS: No edema; No deformity. Bilateral groin sites clear Neuro:  Nonfocal  Psych: Normal affect   Labs    Chemistry Recent Labs Lab 09/08/16 0838  09/09/16 0920 09/09/16 1124 09/10/16 0355 09/11/16 0337  NA 138  < > 141 143 137 142  K 4.1  < > 4.3 4.1 3.5 4.4  CL 106  < > 108  --  106 111  CO2 25  --   --   --  24 26    GLUCOSE 205*  < > 187* 146* 164* 124*  BUN 13  < > 14  --  11 11  CREATININE 1.19  < > 0.90  --  1.11 1.20  CALCIUM 9.4  --   --   --  8.3* 8.9  PROT 6.3*  --   --   --   --   --   ALBUMIN 3.7  --   --   --   --   --   AST 18  --   --   --   --   --   ALT 15*  --   --   --   --   --   ALKPHOS 54  --   --   --   --   --   BILITOT 1.4*  --   --   --   --   --  GFRNONAA 56*  --   --   --  >60 55*  GFRAA >60  --   --   --  >60 >60  ANIONGAP 7  --   --   --  7 5  < > = values in this interval not displayed.   Hematology Recent Labs Lab 09/09/16 1125 09/10/16 0355 09/11/16 0337  WBC 5.3 6.7 5.6  RBC 2.99* 3.00* 3.13*  HGB 9.1* 8.8* 9.3*  HCT 27.6* 27.9* 29.1*  MCV 92.3 93.0 93.0  MCH 30.4 29.3 29.7  MCHC 33.0 31.5 32.0  RDW 15.9* 15.9* 15.9*  PLT 106* 114* 102*    Cardiac EnzymesNo results for input(s): TROPONINI in the last 168 hours. No results for input(s): TROPIPOC in the last 168 hours.   BNPNo results for input(s): BNP, PROBNP in the last 168 hours.   DDimer No results for input(s): DDIMER in the last 168 hours.   Radiology    Dg Chest Port 1 View  Result Date: 09/10/2016 CLINICAL DATA:  Chest pain EXAM: PORTABLE CHEST 1 VIEW COMPARISON:  09/09/2016 FINDINGS: Cardiac shadow is stable. Changes of prior valve replacement are again identified and stable. Left jugular central line is again seen in overlying the proximal SVC. The lungs are well aerated bilaterally. No pneumothorax or focal infiltrate is seen. No bony abnormality is noted. IMPRESSION: No acute abnormality noted. Electronically Signed   By: Inez Catalina M.D.   On: 09/10/2016 08:11   Dg Chest Port 1 View  Result Date: 09/09/2016 CLINICAL DATA:  Postop severe aortic stenosis. EXAM: PORTABLE CHEST 1 VIEW COMPARISON:  08/29/2016 FINDINGS: Changes of aortic valve repair. Cardiomegaly. Left central line is in place with the tip at the confluence of the innominate veins. No effusions or pneumothorax. Lungs are  clear. IMPRESSION: Left central line tip in the upper SVC at the confluence of the innominate veins. No pneumothorax. Mild cardiomegaly. Electronically Signed   By: Rolm Baptise M.D.   On: 09/09/2016 12:08    Cardiac Studies   POD #1 Echo: Left ventricle:  The cavity size was normal. Wall thickness was normal. Systolic function was normal. The estimated ejection fraction was in the range of 60% to 65%. Wall motion was normal; there were no regional wall motion abnormalities. Features are consistent with a pseudonormal left ventricular filling pattern, with concomitant abnormal relaxation and increased filling pressure (grade 2 diastolic dysfunction).  ------------------------------------------------------------------- Aortic valve:  A bioprosthesis was present.  Doppler:   There was no stenosis.   There was no regurgitation.    VTI ratio of LVOT to aortic valve: 0.39. Valve area (VTI): 1.36 cm^2. Indexed valve area (VTI): 0.63 cm^2/m^2. Peak velocity ratio of LVOT to aortic valve: 0.4. Valve area (Vmax): 1.39 cm^2. Indexed valve area (Vmax): 0.65 cm^2/m^2. Mean velocity ratio of LVOT to aortic valve: 0.4. Valve area (Vmean): 1.37 cm^2. Indexed valve area (Vmean): 0.64 cm^2/m^2.    Mean gradient (S): 9 mm Hg. Peak gradient (S): 18 mm Hg.  ------------------------------------------------------------------- Aorta:  Aortic root: The aortic root was normal in size. Ascending aorta: The ascending aorta was normal in size.  ------------------------------------------------------------------- Mitral valve:   Structurally normal valve.   Leaflet separation was normal.  Doppler:  Transvalvular velocity was within the normal range. There was no evidence for stenosis. There was trivial regurgitation.    Peak gradient (D): 4 mm Hg.  ------------------------------------------------------------------- Left atrium:  The atrium was mildly  dilated.  ------------------------------------------------------------------- Right ventricle:  The cavity size was normal. Systolic  function was normal.  ------------------------------------------------------------------- Pulmonic valve:    The valve appears to be grossly normal. Doppler:  There was trivial regurgitation.  ------------------------------------------------------------------- Tricuspid valve:   Structurally normal valve.   Leaflet separation was normal.  Doppler:  Transvalvular velocity was within the normal range. There was mild regurgitation.  ------------------------------------------------------------------- Right atrium:  The atrium was normal in size.  ------------------------------------------------------------------- Pericardium:  There was no pericardial effusion.   Patient Profile     83 y.o. male with aortic valve disease now POD#2 from TAVR  Assessment & Plan    1. Severe aortic stenosis, postoperative day #2 from TAVR: echo reviewed - transcatheter heart valve with good hemodynamic profile based on doppler findings, and no paravalvular AI. Continue ASA alone in setting of recurrent GI bleeding.  2. Anemia, acute on chronic: HgB stable at 9.3 mg/dL this am. Continue iron. Outpatient GI evaluation for treatment of colonic AVM's.   3. Type 2 diabetes: Treated with sliding scale insulin. CBG's 100-149 on SSI. Resume metformin at discharge.   4. GI bleeding: Patient with colonic AVMs. He will undergo colonoscopy and treatment of his AVMs once he has recovered from TAVR.  5. Thrombocytopenia, mild: expected post-TAVR.  [name], MD  09/11/2016, 7:15 AM

## 2016-09-11 NOTE — Plan of Care (Signed)
Problem: Activity: Goal: Risk for activity intolerance will decrease Outcome: Completed/Met Date Met: 09/11/16 Pt walking multiple laps around unit x4 a day  Problem: Bowel/Gastric: Goal: Gastrointestinal status for postoperative course will improve Outcome: Progressing Pt with active BS. Passing gas  Problem: Cardiac: Goal: Hemodynamic stability will improve Outcome: Progressing Within parameters Goal: Will show no signs and symptoms of excessive bleeding Outcome: Completed/Met Date Met: 09/11/16 [facility]

## 2016-09-12 LAB — TYPE AND SCREEN
ABO/RH(D): O NEG
Antibody Screen: POSITIVE
DAT, IgG: NEGATIVE
Donor AG Type: NEGATIVE
Donor AG Type: NEGATIVE
Donor AG Type: NEGATIVE
Donor AG Type: NEGATIVE
Unit division: 0
Unit division: 0
Unit division: 0
Unit division: 0
Unit division: 0

## 2016-09-27 NOTE — Progress Notes (Signed)
Got it. [facility] did TAVR 2 weeks ago.

## 2016-10-03 ENCOUNTER — Other Ambulatory Visit: Payer: Self-pay

## 2016-10-03 ENCOUNTER — Encounter: Payer: Self-pay | Admitting: Cardiovascular Disease

## 2016-10-03 ENCOUNTER — Ambulatory Visit (INDEPENDENT_AMBULATORY_CARE_PROVIDER_SITE_OTHER): Payer: Medicare Other | Admitting: Cardiovascular Disease

## 2016-10-03 ENCOUNTER — Ambulatory Visit (HOSPITAL_COMMUNITY): Payer: Medicare Other | Attending: Cardiology

## 2016-10-03 VITALS — BP 136/64 | HR 48 | Ht 70.0 in | Wt 205.2 lb

## 2016-10-03 DIAGNOSIS — R001 Bradycardia, unspecified: Secondary | ICD-10-CM | POA: Diagnosis not present

## 2016-10-03 DIAGNOSIS — I350 Nonrheumatic aortic (valve) stenosis: Secondary | ICD-10-CM

## 2016-10-03 DIAGNOSIS — I10 Essential (primary) hypertension: Secondary | ICD-10-CM | POA: Insufficient documentation

## 2016-10-03 DIAGNOSIS — E119 Type 2 diabetes mellitus without complications: Secondary | ICD-10-CM | POA: Diagnosis not present

## 2016-10-03 DIAGNOSIS — Z952 Presence of prosthetic heart valve: Secondary | ICD-10-CM | POA: Diagnosis not present

## 2016-10-03 DIAGNOSIS — E785 Hyperlipidemia, unspecified: Secondary | ICD-10-CM | POA: Diagnosis not present

## 2016-10-03 DIAGNOSIS — I351 Nonrheumatic aortic (valve) insufficiency: Secondary | ICD-10-CM | POA: Insufficient documentation

## 2016-10-03 MED ORDER — AMOXICILLIN 500 MG PO TABS: ORAL_TABLET | ORAL | 1 refills | Status: DC

## 2016-10-03 NOTE — Progress Notes (Signed)
Cardiology Office Note Date:  10/03/2016   ID:  Waino, Lessa February 29, 1934, MRN [id number]  PCP:  Jani Gravel, MD  Cardiologist:  Sherren Mocha, MD    Chief Complaint  Patient presents with  . Aortic Stenosis    History of Present Illness: Marvin Houston is a 83 y.o. male who presents for TAVR follow-up evaluation (30-day visit). The patient developed severe symptomatic aortic stenosis and was ultimately treated with a 29 mm transcatheter heart valve 09/09/2016. The patient initially was scheduled to have his procedure done a week before he developed an acute lower GI bleed. He was evaluated by Dr. Paulita Fujita and decision was made to proceed with TAVR prior to endoscopic evaluation. He hasn't a known colonic AVM.  The patient is here alone today. He is doing very well. He is back to walking on a regular basis with no shortness of breath. He denies heart palpitations, chest pain, or leg swelling. He's had no blood in his stool.  Past Medical History:  Diagnosis Date  . Anemia   . Aortic stenosis   . Arthritis   . Diabetes mellitus without complication ([place])   . Dyspnea   . GI bleed    not yet determined cause  . History of recent blood transfusion 09/02/2016  . Hypertension   . Stenosis of aorta     Past Surgical History:  Procedure Laterality Date  . CARDIAC CATHETERIZATION    . HERNIA REPAIR    . TEE WITHOUT CARDIOVERSION N/A 07/29/2016   Procedure: TRANSESOPHAGEAL ECHOCARDIOGRAM (TEE);  Surgeon: Adrian Prows, MD;  Location: [facility];  Service: Cardiovascular;  Laterality: N/A;  . TEE WITHOUT CARDIOVERSION N/A 09/09/2016   Procedure: TRANSESOPHAGEAL ECHOCARDIOGRAM (TEE);  Surgeon: Sherren Mocha, MD;  Location: [facility];  Service: Open Heart Surgery;  Laterality: N/A;  . TRANSCATHETER AORTIC VALVE REPLACEMENT, TRANSFEMORAL Bilateral 09/09/2016   Procedure: TRANSCATHETER AORTIC VALVE REPLACEMENT, TRANSFEMORAL;  Surgeon: Sherren Mocha, MD;  Location[facility];  Service: Open Heart  Surgery;  Laterality: Bilateral;    Current Outpatient Prescriptions  Medication Sig Dispense Refill  . aspirin EC 81 MG EC tablet Take 1 tablet (81 mg total) by mouth daily.    . Cyanocobalamin (VITAMIN B-12 PO) Take 1 tablet by mouth every evening.     . ferrous sulfate 325 (65 FE) MG tablet Take 1 tablet (325 mg total) by mouth 2 (two) times daily with a meal. 60 tablet 12  . lisinopril (PRINIVIL,ZESTRIL) 40 MG tablet Take 40 mg by mouth every evening.     . lovastatin (MEVACOR) 40 MG tablet Take 40 mg by mouth 2 (two) times a week. Mon and Tues    . metFORMIN (GLUCOPHAGE) 500 MG tablet Take 500 mg by mouth 2 (two) times daily with a meal.     . Multiple Vitamin (MULTIVITAMIN WITH MINERALS) TABS tablet Take 1 tablet by mouth every evening.     . Multiple Vitamins-Minerals (VISION VITAMINS PO) Take 1 tablet by mouth every evening.     . psyllium (METAMUCIL) 58.6 % powder Take 1 packet by mouth daily.    . sildenafil (VIAGRA) 100 MG tablet Take 100 mg by mouth once as needed for erectile dysfunction.     No current facility-administered medications for this visit.     Allergies:   No known allergies   Social History:  The patient  reports that he has never smoked. He has never used smokeless tobacco. He reports that he drinks alcohol. He reports that  he does not use drugs.   Family History:  The patient's  family history is not on file.  ROS:  Please see the history of present illness.  Otherwise, review of systems is positive for balance problems, easy bruising.  All other systems are reviewed and negative.   PHYSICAL EXAM: VS:  BP 136/64   Pulse (!) 48   Ht 5\' 10"  (1.778 m)   Wt 205 lb 3.2 oz (93.1 kg)   BMI 29.44 kg/m  , BMI Body mass index is 29.44 kg/m. GEN: Well nourished, well developed, in no acute distress  HEENT: normal  Neck: no JVD, no masses Cardiac: bradycardic and regular without murmur or gallop                Respiratory:  clear to auscultation bilaterally,  normal work of breathing GI: soft, nontender, nondistended, + BS MS: no deformity or atrophy  Ext: no pretibial edema, pedal pulses 2+= bilaterally Skin: warm and dry, no rash Neuro:  Strength and sensation are intact Psych: euthymic mood, full affect  EKG:  EKG is ordered today. The ekg ordered today shows sinus bradycardia 46 bpm, otherwise within normal limits.  Recent Labs: 09/08/2016: ALT 15 09/10/2016: Magnesium 2.0 09/11/2016: BUN 11; Creatinine, Ser 1.20; Hemoglobin 9.3; Platelets 102; Potassium 4.4; Sodium 142   Lipid Panel  No results found for: CHOL, TRIG, HDL, CHOLHDL, VLDL, LDLCALC, LDLDIRECT    Wt Readings from Last 3 Encounters:  10/03/16 205 lb 3.2 oz (93.1 kg)  09/11/16 201 lb 8 oz (91.4 kg)  09/08/16 203 lb 1.6 oz (92.1 kg)     Cardiac Studies Reviewed: 2D Echo: Study Conclusions  - Left ventricle: The cavity size was normal. Wall thickness was   increased in a pattern of mild LVH. Systolic function was normal.   The estimated ejection fraction was in the range of 55% to 60%.   Wall motion was normal; there were no regional wall motion   abnormalities. Doppler parameters are consistent with abnormal   left ventricular relaxation (grade 1 diastolic dysfunction). - Aortic valve: A bioprosthesis was present. There was mild   regurgitation. - Ascending aorta: The ascending aorta was mildly dilated. - Left atrium: The atrium was mildly dilated.  Impressions:  - Normal LV systolic function; probable mild diastolic dysfunction;   s/p AVR with normal mean gradient; mild AI; mild LAE.  ASSESSMENT AND PLAN: 83 year old male with severe symptomatically aortic stenosis now one month out from TAVR with a 29 mm Edwards Sapien 3 transcatheter heart valve.   The patient now has New York Heart Association functional class I symptoms. His echo images are personally reviewed. He has normal transaortic valve gradients with a mean gradient of 12 and a peak gradient of 21  mmHg. There is trace to mild paravalvular regurgitation. His physical exam is completely normal with no systolic or diastolic murmur appreciated. SBE prophylaxis is reviewed with the patient. He can return to all normal activities. He will follow-up with Dr. Einar Gip as scheduled. He has had no recurrent symptoms of lower GI bleeding, but I encouraged him to follow-up with Dr Paulita Fujita for discussion of whether he should pursue further evaluation.   Current medicines are reviewed with the patient today.  The patient does not have concerns regarding medicines.  Labs/ tests ordered today include:  No orders of the defined types were placed in this encounter.  Disposition:   FU with Dr Einar Gip as scheduled. FU Valve Clinic one year with an echo  [name], MD  10/03/2016 10:03 AM    [address], [place], [place]  [id number] Phone: [phone]; Fax: [phone]

## 2016-10-03 NOTE — Patient Instructions (Addendum)
Medication Instructions:  Your physician recommends that you continue on your current medications as directed. Please refer to the Current Medication list given to you today.  Labwork: No new orders.   Testing/Procedures: Your physician has requested that you have an echocardiogram in 1 YEAR.  Echocardiography is a painless test that uses sound waves to create images of your heart. It provides your doctor with information about the size and shape of your heart and how well your heart's chambers and valves are working. This procedure takes approximately one hour. There are no restrictions for this procedure.  Follow-Up: Your physician wants you to follow-up in: 1 YEAR with Dr Burt Knack.  You will receive a reminder letter in the mail two months in advance. If you don't receive a letter, please call our office to schedule the follow-up appointment.   Any Other Special Instructions Will Be Listed Below (If Applicable).  Your physician discussed the importance of taking an antibiotic prior to any dental, gastrointestinal, genitourinary procedures to prevent damage to the heart valves from infection.    If you need a refill on your cardiac medications before your next appointment, please call your pharmacy.

## 2016-10-22 ENCOUNTER — Telehealth: Payer: Self-pay | Admitting: Cardiovascular Disease

## 2016-10-22 NOTE — Telephone Encounter (Signed)
New Message   Per wife pt is having frequent nose bleeds, is this something they should be concerned with.   One happened in the middle of the night about 4 nights ago and then he had one again today

## 2016-10-22 NOTE — Telephone Encounter (Signed)
Pt states he had a nose bleed 4-5 days ago, he stopped taking his aspirin.  Pt states today, while bending over, he had another nose bleed that stopped after applying pressure.   I reviewed with Dr Marlou Porch- Per Dr Ardeth Perfect aspirin 81mg  daily, keep nasal passages hydrated, follow up with ENT if nose bleeds continue.  I discussed Dr Marlou Porch recommendations with pt, pt agreed with plan, asked me to forward this information to Dr Burt Knack for review.

## 2016-10-24 DIAGNOSIS — R739 Hyperglycemia, unspecified: Secondary | ICD-10-CM | POA: Diagnosis not present

## 2016-10-24 DIAGNOSIS — Z131 Encounter for screening for diabetes mellitus: Secondary | ICD-10-CM | POA: Diagnosis not present

## 2016-10-24 DIAGNOSIS — I10 Essential (primary) hypertension: Secondary | ICD-10-CM | POA: Diagnosis not present

## 2016-10-24 DIAGNOSIS — D649 Anemia, unspecified: Secondary | ICD-10-CM | POA: Diagnosis not present

## 2016-10-24 DIAGNOSIS — R040 Epistaxis: Secondary | ICD-10-CM | POA: Diagnosis not present

## 2016-10-26 NOTE — Telephone Encounter (Signed)
Agreed thanks!

## 2016-10-28 DIAGNOSIS — R040 Epistaxis: Secondary | ICD-10-CM | POA: Diagnosis not present

## 2016-11-03 DIAGNOSIS — R739 Hyperglycemia, unspecified: Secondary | ICD-10-CM | POA: Diagnosis not present

## 2016-11-03 DIAGNOSIS — I10 Essential (primary) hypertension: Secondary | ICD-10-CM | POA: Diagnosis not present

## 2016-11-03 DIAGNOSIS — D649 Anemia, unspecified: Secondary | ICD-10-CM | POA: Diagnosis not present

## 2016-11-03 DIAGNOSIS — Z125 Encounter for screening for malignant neoplasm of prostate: Secondary | ICD-10-CM | POA: Diagnosis not present

## 2016-12-04 DIAGNOSIS — I2510 Atherosclerotic heart disease of native coronary artery without angina pectoris: Secondary | ICD-10-CM | POA: Diagnosis not present

## 2016-12-04 DIAGNOSIS — Z952 Presence of prosthetic heart valve: Secondary | ICD-10-CM | POA: Diagnosis not present

## 2016-12-04 DIAGNOSIS — I10 Essential (primary) hypertension: Secondary | ICD-10-CM | POA: Diagnosis not present

## 2016-12-04 DIAGNOSIS — R0989 Other specified symptoms and signs involving the circulatory and respiratory systems: Secondary | ICD-10-CM | POA: Diagnosis not present

## 2016-12-19 DIAGNOSIS — K625 Hemorrhage of anus and rectum: Secondary | ICD-10-CM | POA: Diagnosis not present

## 2016-12-19 DIAGNOSIS — D649 Anemia, unspecified: Secondary | ICD-10-CM | POA: Diagnosis not present

## 2016-12-19 DIAGNOSIS — Z8774 Personal history of (corrected) congenital malformations of heart and circulatory system: Secondary | ICD-10-CM | POA: Diagnosis not present

## 2017-01-23 DIAGNOSIS — I10 Essential (primary) hypertension: Secondary | ICD-10-CM | POA: Diagnosis not present

## 2017-01-23 DIAGNOSIS — I2510 Atherosclerotic heart disease of native coronary artery without angina pectoris: Secondary | ICD-10-CM | POA: Diagnosis not present

## 2017-01-23 DIAGNOSIS — Z952 Presence of prosthetic heart valve: Secondary | ICD-10-CM | POA: Diagnosis not present

## 2017-01-30 DIAGNOSIS — Z872 Personal history of diseases of the skin and subcutaneous tissue: Secondary | ICD-10-CM | POA: Diagnosis not present

## 2017-01-30 DIAGNOSIS — D225 Melanocytic nevi of trunk: Secondary | ICD-10-CM | POA: Diagnosis not present

## 2017-01-30 DIAGNOSIS — L820 Inflamed seborrheic keratosis: Secondary | ICD-10-CM | POA: Diagnosis not present

## 2017-01-30 DIAGNOSIS — L821 Other seborrheic keratosis: Secondary | ICD-10-CM | POA: Diagnosis not present

## 2017-02-25 DIAGNOSIS — M47811 Spondylosis without myelopathy or radiculopathy, occipito-atlanto-axial region: Secondary | ICD-10-CM | POA: Diagnosis not present

## 2017-02-25 DIAGNOSIS — M47816 Spondylosis without myelopathy or radiculopathy, lumbar region: Secondary | ICD-10-CM | POA: Diagnosis not present

## 2017-02-25 DIAGNOSIS — M9901 Segmental and somatic dysfunction of cervical region: Secondary | ICD-10-CM | POA: Diagnosis not present

## 2017-02-25 DIAGNOSIS — M9903 Segmental and somatic dysfunction of lumbar region: Secondary | ICD-10-CM | POA: Diagnosis not present

## 2017-02-27 DIAGNOSIS — H2513 Age-related nuclear cataract, bilateral: Secondary | ICD-10-CM | POA: Diagnosis not present

## 2017-03-02 DIAGNOSIS — M47811 Spondylosis without myelopathy or radiculopathy, occipito-atlanto-axial region: Secondary | ICD-10-CM | POA: Diagnosis not present

## 2017-03-02 DIAGNOSIS — M47816 Spondylosis without myelopathy or radiculopathy, lumbar region: Secondary | ICD-10-CM | POA: Diagnosis not present

## 2017-03-02 DIAGNOSIS — M9901 Segmental and somatic dysfunction of cervical region: Secondary | ICD-10-CM | POA: Diagnosis not present

## 2017-03-02 DIAGNOSIS — M9903 Segmental and somatic dysfunction of lumbar region: Secondary | ICD-10-CM | POA: Diagnosis not present

## 2017-03-18 DIAGNOSIS — M47816 Spondylosis without myelopathy or radiculopathy, lumbar region: Secondary | ICD-10-CM | POA: Diagnosis not present

## 2017-03-18 DIAGNOSIS — M47811 Spondylosis without myelopathy or radiculopathy, occipito-atlanto-axial region: Secondary | ICD-10-CM | POA: Diagnosis not present

## 2017-03-18 DIAGNOSIS — M9903 Segmental and somatic dysfunction of lumbar region: Secondary | ICD-10-CM | POA: Diagnosis not present

## 2017-03-18 DIAGNOSIS — M9901 Segmental and somatic dysfunction of cervical region: Secondary | ICD-10-CM | POA: Diagnosis not present

## 2017-05-01 DIAGNOSIS — R739 Hyperglycemia, unspecified: Secondary | ICD-10-CM | POA: Diagnosis not present

## 2017-05-01 DIAGNOSIS — D649 Anemia, unspecified: Secondary | ICD-10-CM | POA: Diagnosis not present

## 2017-05-01 DIAGNOSIS — Z125 Encounter for screening for malignant neoplasm of prostate: Secondary | ICD-10-CM | POA: Diagnosis not present

## 2017-05-01 DIAGNOSIS — D509 Iron deficiency anemia, unspecified: Secondary | ICD-10-CM | POA: Diagnosis not present

## 2017-05-01 DIAGNOSIS — I10 Essential (primary) hypertension: Secondary | ICD-10-CM | POA: Diagnosis not present

## 2017-06-11 DIAGNOSIS — Z23 Encounter for immunization: Secondary | ICD-10-CM | POA: Diagnosis not present

## 2017-06-11 DIAGNOSIS — Z0000 Encounter for general adult medical examination without abnormal findings: Secondary | ICD-10-CM | POA: Diagnosis not present

## 2017-06-25 ENCOUNTER — Other Ambulatory Visit: Payer: Self-pay

## 2017-06-25 DIAGNOSIS — Z952 Presence of prosthetic heart valve: Secondary | ICD-10-CM

## 2017-06-25 DIAGNOSIS — I350 Nonrheumatic aortic (valve) stenosis: Secondary | ICD-10-CM

## 2017-08-15 DIAGNOSIS — B029 Zoster without complications: Secondary | ICD-10-CM | POA: Diagnosis not present

## 2017-08-17 DIAGNOSIS — B0239 Other herpes zoster eye disease: Secondary | ICD-10-CM | POA: Diagnosis not present

## 2017-08-25 ENCOUNTER — Encounter: Payer: Self-pay | Admitting: Physician Assistant

## 2017-08-26 DIAGNOSIS — M530 Cervicocranial syndrome: Secondary | ICD-10-CM | POA: Diagnosis not present

## 2017-08-26 DIAGNOSIS — M9901 Segmental and somatic dysfunction of cervical region: Secondary | ICD-10-CM | POA: Diagnosis not present

## 2017-08-28 DIAGNOSIS — I2510 Atherosclerotic heart disease of native coronary artery without angina pectoris: Secondary | ICD-10-CM | POA: Diagnosis not present

## 2017-08-28 DIAGNOSIS — I10 Essential (primary) hypertension: Secondary | ICD-10-CM | POA: Diagnosis not present

## 2017-08-28 DIAGNOSIS — Z298 Encounter for other specified prophylactic measures: Secondary | ICD-10-CM | POA: Diagnosis not present

## 2017-08-28 DIAGNOSIS — Z952 Presence of prosthetic heart valve: Secondary | ICD-10-CM | POA: Diagnosis not present

## 2017-08-31 DIAGNOSIS — M530 Cervicocranial syndrome: Secondary | ICD-10-CM | POA: Diagnosis not present

## 2017-08-31 DIAGNOSIS — M9901 Segmental and somatic dysfunction of cervical region: Secondary | ICD-10-CM | POA: Diagnosis not present

## 2017-09-03 DIAGNOSIS — M530 Cervicocranial syndrome: Secondary | ICD-10-CM | POA: Diagnosis not present

## 2017-09-03 DIAGNOSIS — M9901 Segmental and somatic dysfunction of cervical region: Secondary | ICD-10-CM | POA: Diagnosis not present

## 2017-09-04 DIAGNOSIS — I10 Essential (primary) hypertension: Secondary | ICD-10-CM | POA: Diagnosis not present

## 2017-09-04 DIAGNOSIS — D509 Iron deficiency anemia, unspecified: Secondary | ICD-10-CM | POA: Diagnosis not present

## 2017-09-04 DIAGNOSIS — Z0000 Encounter for general adult medical examination without abnormal findings: Secondary | ICD-10-CM | POA: Diagnosis not present

## 2017-09-04 DIAGNOSIS — E119 Type 2 diabetes mellitus without complications: Secondary | ICD-10-CM | POA: Diagnosis not present

## 2017-09-04 DIAGNOSIS — E7800 Pure hypercholesterolemia, unspecified: Secondary | ICD-10-CM | POA: Diagnosis not present

## 2017-09-07 DIAGNOSIS — M530 Cervicocranial syndrome: Secondary | ICD-10-CM | POA: Diagnosis not present

## 2017-09-07 DIAGNOSIS — M9901 Segmental and somatic dysfunction of cervical region: Secondary | ICD-10-CM | POA: Diagnosis not present

## 2017-09-08 NOTE — Progress Notes (Signed)
[facility] [place]                                       Cardiology Office Note    Date:  09/09/2017   ID:  [name], DOB October 20, 1933, MRN [id number]  PCP:  Jani Gravel, MD  Cardiologist:  Dr. Einar Gip / Dr. Burt Knack & Dr. Cyndia Bent (TAVR)  CC: 1 year follow up s/p TAVR   History of Present Illness:  Marvin Houston is a 84 y.o. male with a history of DM, HTN, recurrent GI bleeding from large colonic AVM's and severe aortic stenosis s/p TAVR (08/2016) who presents to clinic for 1 year follow up.   The patient developed severe symptomatic aortic stenosis and was ultimately treated with a 29 mm transcatheter heart valve 09/09/2016. The patient initially was scheduled to have his procedure done a week before he developed an acute lower GI bleed. He was evaluated by Dr. Paulita Fujita and decision was made to proceed with TAVR prior to endoscopic evaluation. He hasn't a known colonic AVM.  He was seen by Dr. Burt Knack in 09/2016 for 1 month follow up and doing quite well. He had NHYA class I symptoms and echo at that time showed normal transaortic valve gradients with a mean gradient of 12 and a peak gradient of 21 mmHg and mild PVL. He has been on baby aspirin.   Today he presents to clinic for follow up. He has been feeling great. He regularly plays golf and rides a stationary at least 3 times a week with no issues. No chest pain or SOB. He does sometimes "gives out" when playing long games of golf. He has some trace LE edema, orthopnea or PND. No dizziness or syncope. No blood in stool or urine. No palpitations. He just got over a case of shingles.    Past Medical History:  Diagnosis Date  . Anemia   . Aortic stenosis   . Arthritis   . Diabetes mellitus without complication ([place])   . Dyspnea   . GI bleed    not yet determined cause  . History of recent blood transfusion 09/02/2016  . Hypertension   . Stenosis of aorta     Past Surgical History:  Procedure  Laterality Date  . CARDIAC CATHETERIZATION    . HERNIA REPAIR    . TEE WITHOUT CARDIOVERSION N/A 07/29/2016   Procedure: TRANSESOPHAGEAL ECHOCARDIOGRAM (TEE);  Surgeon: Adrian Prows, MD;  Location: [facility];  Service: Cardiovascular;  Laterality: N/A;  . TEE WITHOUT CARDIOVERSION N/A 09/09/2016   Procedure: TRANSESOPHAGEAL ECHOCARDIOGRAM (TEE);  Surgeon: Sherren Mocha, MD;  Location: [facility];  Service: Open Heart Surgery;  Laterality: N/A;  . TRANSCATHETER AORTIC VALVE REPLACEMENT, TRANSFEMORAL Bilateral 09/09/2016   Procedure: TRANSCATHETER AORTIC VALVE REPLACEMENT, TRANSFEMORAL;  Surgeon: Sherren Mocha, MD;  Location: [facility];  Service: Open Heart Surgery;  Laterality: Bilateral;    Current Medications: Outpatient Medications Prior to Visit  Medication Sig Dispense Refill  . amoxicillin (AMOXIL) 500 MG tablet Take 4 tablets by mouth one hour prior to dental appointment 8 tablet 1  . aspirin EC 81 MG EC tablet Take 1 tablet (81 mg total) by mouth daily.    . Cyanocobalamin (VITAMIN B-12 PO) Take 1 tablet by mouth every evening.     . ferrous sulfate 325 (65 FE) MG tablet Take 1 tablet (325 mg total)  by mouth 2 (two) times daily with a meal. 60 tablet 12  . lisinopril (PRINIVIL,ZESTRIL) 40 MG tablet Take 40 mg by mouth every evening.     . lovastatin (MEVACOR) 40 MG tablet Take 40 mg by mouth 2 (two) times a week. Mon and Tues    . metFORMIN (GLUCOPHAGE) 500 MG tablet Take 500 mg by mouth 2 (two) times daily with a meal.     . Multiple Vitamin (MULTIVITAMIN WITH MINERALS) TABS tablet Take 1 tablet by mouth every evening.     . Multiple Vitamins-Minerals (VISION VITAMINS PO) Take 1 tablet by mouth every evening.     . psyllium (METAMUCIL) 58.6 % powder Take 1 packet by mouth daily.    . sildenafil (VIAGRA) 100 MG tablet Take 100 mg by mouth once as needed for erectile dysfunction.     No facility-administered medications prior to visit.      Allergies:   No known allergies   Social History    Socioeconomic History  . Marital status: Married    Spouse name: Not on file  . Number of children: Not on file  . Years of education: Not on file  . Highest education level: Not on file  Social Needs  . Financial resource strain: Not on file  . Food insecurity - worry: Not on file  . Food insecurity - inability: Not on file  . Transportation needs - medical: Not on file  . Transportation needs - non-medical: Not on file  Occupational History  . Not on file  Tobacco Use  . Smoking status: Never Smoker  . Smokeless tobacco: Never Used  Substance and Sexual Activity  . Alcohol use: Yes  . Drug use: No  . Sexual activity: Not on file  Other Topics Concern  . Not on file  Social History Narrative  . Not on file     Family History:  The patient's is not on file    ROS:   Please see the history of present illness.    ROS All other systems reviewed and are negative.   PHYSICAL EXAM:   VS:  BP 138/72 (BP Location: Right Arm, Patient Position: Sitting, Cuff Size: Large)   Pulse (!) 56   Ht 5\' 10"  (1.778 m)   Wt 213 lb (96.6 kg)   BMI 30.56 kg/m    GEN: Well nourished, well developed, in no acute distress  HEENT: normal  Neck: no JVD, carotid bruits, or masses Cardiac: RRR; no murmurs, rubs, or gallops,no edema  Respiratory:  clear to auscultation bilaterally, normal work of breathing GI: soft, nontender, nondistended, + BS MS: no deformity or atrophy  Skin: warm and dry, no rash Neuro:  Alert and Oriented x 3, Strength and sensation are intact Psych: euthymic mood, full affect    Wt Readings from Last 3 Encounters:  09/09/17 213 lb (96.6 kg)  10/03/16 205 lb 3.2 oz (93.1 kg)  09/11/16 201 lb 8 oz (91.4 kg)      Studies/Labs Reviewed:   EKG:  EKG is NOT ordered today.   Recent Labs: 09/10/2016: Magnesium 2.0 09/11/2016: BUN 11; Creatinine, Ser 1.20; Hemoglobin 9.3; Platelets 102; Potassium 4.4; Sodium 142   Lipid Panel No results found for: CHOL, TRIG,  HDL, CHOLHDL, VLDL, LDLCALC, LDLDIRECT  Additional studies/ records that were reviewed today include:   2D ECHO: 10/03/17  Study Conclusions - Left ventricle: The cavity size was normal. Wall thickness was increased in a pattern of mild LVH. Systolic function was normal.  The estimated ejection fraction was in the range of 55% to 60%. Wall motion was normal; there were no regional wall motion abnormalities. Doppler parameters are consistent with abnormal left ventricular relaxation (grade 1 diastolic dysfunction). - Aortic valve: A bioprosthesis was present. There was mild regurgitation. - Ascending aorta: The ascending aorta was mildly dilated. - Left atrium: The atrium was mildly dilated. Impressions: - Normal LV systolic function; probable mild diastolic dysfunction; s/p AVR with normal mean gradient; mild AI; mild LAE.   2D ECHO 09/09/17 Study Conclusions - Left ventricle: The cavity size was normal. Wall thickness was   increased in a pattern of mild LVH. Systolic function was normal.   The estimated ejection fraction was in the range of 60% to 65%.   Wall motion was normal; there were no regional wall motion   abnormalities. Doppler parameters are consistent with abnormal   left ventricular relaxation (grade 1 diastolic dysfunction). The   E/e&' ratio is between 8-15, suggesting indeterminate LV filling   pressure. - Aortic valve: s/p Sapien 3 TAVR. No obstruction. Mild   paravalvular leak. Mean gradient (S): 14 mm Hg. Peak gradient   (S): 26 mm Hg. Valve area (VTI): 1.6 cm^2. Valve area (Vmax):   1.44 cm^2. Valve area (Vmean): 1.51 cm^2. - Tricuspid valve: There was trivial regurgitation. - Pulmonary arteries: PA peak pressure: 28 mm Hg (S). - Inferior vena cava: The vessel was normal in size. The   respirophasic diameter changes were in the normal range (= 50%),   consistent with normal central venous pressure. Impressions: - Compared to a prior study in  2018, there are no significant   changes. There is most likely paravalvular leak, rather than   valvular insufficiency.  ASSESSMENT & PLAN:   Severe AS s/p TAVR: he is doing excellent 1 year after TAVR. He has NHYA class I symptoms. 2D ECHO today shows normally functioning TAVR valve with mild PVL and mean gradient 14 mm Hg. He has Amoxil for SBE prophylaxis. He will continue on ASA 81 mg daily. He no longer needs to follow with valve clinic and can continue regular follow up with Dr. Einar Gip  HTN: BP well controlled today    DMT2: continue current regimen  Hx of GI bleeding: he has had no recent issues and been stable on a baby asa.    Medication Adjustments/Labs and Tests Ordered: Current medicines are reviewed at length with the patient today.  Concerns regarding medicines are outlined above.  Medication changes, Labs and Tests ordered today are listed in the Patient Instructions below. Patient Instructions  Medication Instructions:  Your physician recommends that you continue on your current medications as directed. Please refer to the Current Medication list given to you today.   Labwork: none  Testing/Procedures: none  Follow-Up: Follow up with Dr. Einar Gip as planned  Any Other Special Instructions Will Be Listed Below (If Applicable).     If you need a refill on your cardiac medications before your next appointment, please call your pharmacy.      Signed, Angelena Form, PA-C  09/09/2017 3:08 PM    [facility] [address], [place], [place]  [address] Phone: [phone]; Fax: [phone]

## 2017-09-09 ENCOUNTER — Ambulatory Visit: Payer: Medicare Other | Admitting: Physician Assistant

## 2017-09-09 ENCOUNTER — Other Ambulatory Visit: Payer: Self-pay

## 2017-09-09 ENCOUNTER — Ambulatory Visit (HOSPITAL_COMMUNITY): Payer: Medicare Other | Attending: Internal Medicine

## 2017-09-09 VITALS — BP 138/72 | HR 56 | Ht 70.0 in | Wt 213.0 lb

## 2017-09-09 DIAGNOSIS — I5030 Unspecified diastolic (congestive) heart failure: Secondary | ICD-10-CM | POA: Diagnosis not present

## 2017-09-09 DIAGNOSIS — Z952 Presence of prosthetic heart valve: Secondary | ICD-10-CM | POA: Diagnosis not present

## 2017-09-09 DIAGNOSIS — Z8719 Personal history of other diseases of the digestive system: Secondary | ICD-10-CM

## 2017-09-09 DIAGNOSIS — I069 Rheumatic aortic valve disease, unspecified: Secondary | ICD-10-CM | POA: Diagnosis not present

## 2017-09-09 DIAGNOSIS — I10 Essential (primary) hypertension: Secondary | ICD-10-CM

## 2017-09-09 DIAGNOSIS — I350 Nonrheumatic aortic (valve) stenosis: Secondary | ICD-10-CM

## 2017-09-09 DIAGNOSIS — E118 Type 2 diabetes mellitus with unspecified complications: Secondary | ICD-10-CM | POA: Diagnosis not present

## 2017-09-09 NOTE — Patient Instructions (Signed)
Medication Instructions:  Your physician recommends that you continue on your current medications as directed. Please refer to the Current Medication list given to you today.   Labwork: none  Testing/Procedures: none  Follow-Up: Follow up with Dr. Einar Gip as planned  Any Other Special Instructions Will Be Listed Below (If Applicable).     If you need a refill on your cardiac medications before your next appointment, please call your pharmacy.

## 2017-09-10 DIAGNOSIS — M9901 Segmental and somatic dysfunction of cervical region: Secondary | ICD-10-CM | POA: Diagnosis not present

## 2017-09-10 DIAGNOSIS — M530 Cervicocranial syndrome: Secondary | ICD-10-CM | POA: Diagnosis not present

## 2017-09-11 DIAGNOSIS — E7800 Pure hypercholesterolemia, unspecified: Secondary | ICD-10-CM | POA: Diagnosis not present

## 2017-09-11 DIAGNOSIS — E119 Type 2 diabetes mellitus without complications: Secondary | ICD-10-CM | POA: Diagnosis not present

## 2017-09-11 DIAGNOSIS — I10 Essential (primary) hypertension: Secondary | ICD-10-CM | POA: Diagnosis not present

## 2017-09-14 DIAGNOSIS — M530 Cervicocranial syndrome: Secondary | ICD-10-CM | POA: Diagnosis not present

## 2017-09-14 DIAGNOSIS — M9901 Segmental and somatic dysfunction of cervical region: Secondary | ICD-10-CM | POA: Diagnosis not present

## 2017-09-21 DIAGNOSIS — M9901 Segmental and somatic dysfunction of cervical region: Secondary | ICD-10-CM | POA: Diagnosis not present

## 2017-09-21 DIAGNOSIS — M530 Cervicocranial syndrome: Secondary | ICD-10-CM | POA: Diagnosis not present

## 2017-09-23 DIAGNOSIS — M530 Cervicocranial syndrome: Secondary | ICD-10-CM | POA: Diagnosis not present

## 2017-09-23 DIAGNOSIS — M9901 Segmental and somatic dysfunction of cervical region: Secondary | ICD-10-CM | POA: Diagnosis not present

## 2017-09-28 DIAGNOSIS — M530 Cervicocranial syndrome: Secondary | ICD-10-CM | POA: Diagnosis not present

## 2017-09-28 DIAGNOSIS — M9901 Segmental and somatic dysfunction of cervical region: Secondary | ICD-10-CM | POA: Diagnosis not present

## 2017-09-30 DIAGNOSIS — M530 Cervicocranial syndrome: Secondary | ICD-10-CM | POA: Diagnosis not present

## 2017-09-30 DIAGNOSIS — M9901 Segmental and somatic dysfunction of cervical region: Secondary | ICD-10-CM | POA: Diagnosis not present

## 2017-10-05 DIAGNOSIS — M9901 Segmental and somatic dysfunction of cervical region: Secondary | ICD-10-CM | POA: Diagnosis not present

## 2017-10-05 DIAGNOSIS — M530 Cervicocranial syndrome: Secondary | ICD-10-CM | POA: Diagnosis not present

## 2017-10-29 ENCOUNTER — Encounter: Payer: Self-pay | Admitting: Thoracic Surgery (Cardiothoracic Vascular Surgery)

## 2017-11-16 IMAGING — CT CT HEART MORP W/ CTA COR W/ SCORE W/ CA W/CM &/OR W/O CM
1 of 14 series · 1 of 19 positions shown, 2 images · non-contrast
Comparison: None.

CLINICAL DATA: 82-year-old male with severe aortic stenosis.

EXAM:
Cardiac TAVR CT
TECHNIQUE: The patient was scanned on a Philips 256 scanner. A 120 kV
retrospective scan was triggered in the descending thoracic aorta at
111 HU's. Gantry rotation speed was 270 msecs and collimation was .9
mm. No beta blockade or nitro were given. The 3D data set was
reconstructed in 5% intervals of the R-R cycle. Systolic and
diastolic phases were analyzed on a dedicated work station using
MPR, MIP and VRT modes. The patient received 80 cc of contrast.

[Series 200: locator · axial · 0.35mm/px · z∈[-123,-123]mm · 1 of 1 slices shown, 2 images]
[im 1/1  vessel]
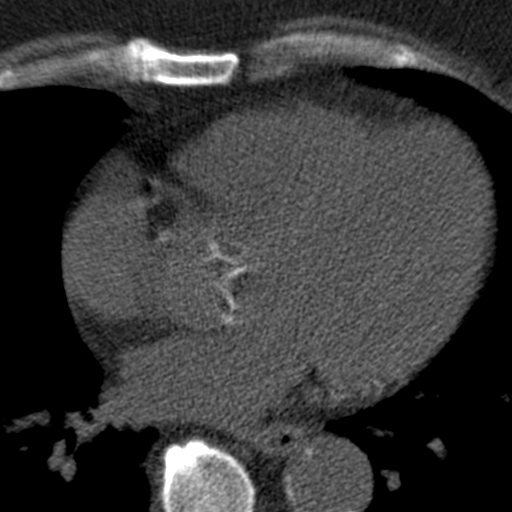
[im 1/1  lung]
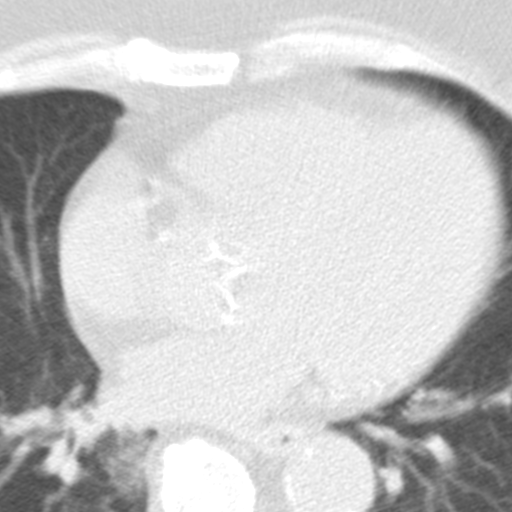

[1 of 19 positions shown; findings below may reference images not displayed]

FINDINGS: Aortic Valve: Functionally bicuspid aortic valve with non-separated
left and right coronary cusp. There is severe calcification and
thickening of the leaflets with almost no calcium extending into the
LVOT.

Aorta: Mild dilatation of the sinotubular junction and upper normal
size of the ascending aorta. Mild diffuse calcifications of the
aortic arch and descending aorta.

Sinotubular Junction:  35 x 34 mm

Ascending Thoracic Aorta:  40 x 40 mm

Aortic Arch:  31 x 30 mm

Descending Thoracic Aorta:  30 x 29 mm

Sinus of Valsalva Measurements:

Non-coronary:  39 mm

Right -coronary:  38 mm

Left -coronary:  39 mm

Coronary Artery Height above Annulus:

Left Main:  14 mm

Right Coronary:  13 mm

Virtual Basal Annulus Measurements:

Maximum/Minimum Diameter:  32 x 26 mm

Perimeter:  94 mm

Area:  666 mm2

Coronary Arteries:  Normal origin.  Right dominance.

Optimum Fluoroscopic Angle for Delivery:  RAO 2 LESYA 1
IMPRESSION: 1. Functionally bicuspid aortic valve with non-separated left and
right coronary cusp. There is severe calcification and thickening of
the leaflets with almost no calcium extending into the LVOT.

Annular measurements are at upper limit for the 29 mm Edwards-SAPIEN
3 valve.

2. Mild dilatation of the sinotubular junction and upper normal size
of the ascending aorta measuring 40 mm.

3.  Sufficient annulus to coronary distance.

4. Optimum Fluoroscopic Angle for Delivery:  RAO 2 LESYA 1

Aileen May Arrighetti

EXAM:
OVER-READ INTERPRETATION  CT CHEST

The following report is an over-read performed by radiologist Dr.
over-read does not include interpretation of cardiac or coronary
anatomy or pathology. The coronary CTA interpretation by the
cardiologist is attached.
FINDINGS: Extracardiac findings will be described on separate dictation for
contemporaneously obtained CTA of the chest, abdomen and pelvis.
IMPRESSION: Please see separate dictation for contemporaneously obtained CTA of
the chest, abdomen and pelvis dated 08/25/2016 for full description
of relevant extracardiac findings.

## 2017-11-20 IMAGING — CR DG CHEST 2V
2 series · 2 of 2 positions shown · non-contrast
Comparison: CT chest 08/25/2000

CLINICAL DATA: Severe aortic stenosis.  Preop

EXAM:
CHEST  2 VIEW

[w chest pa]
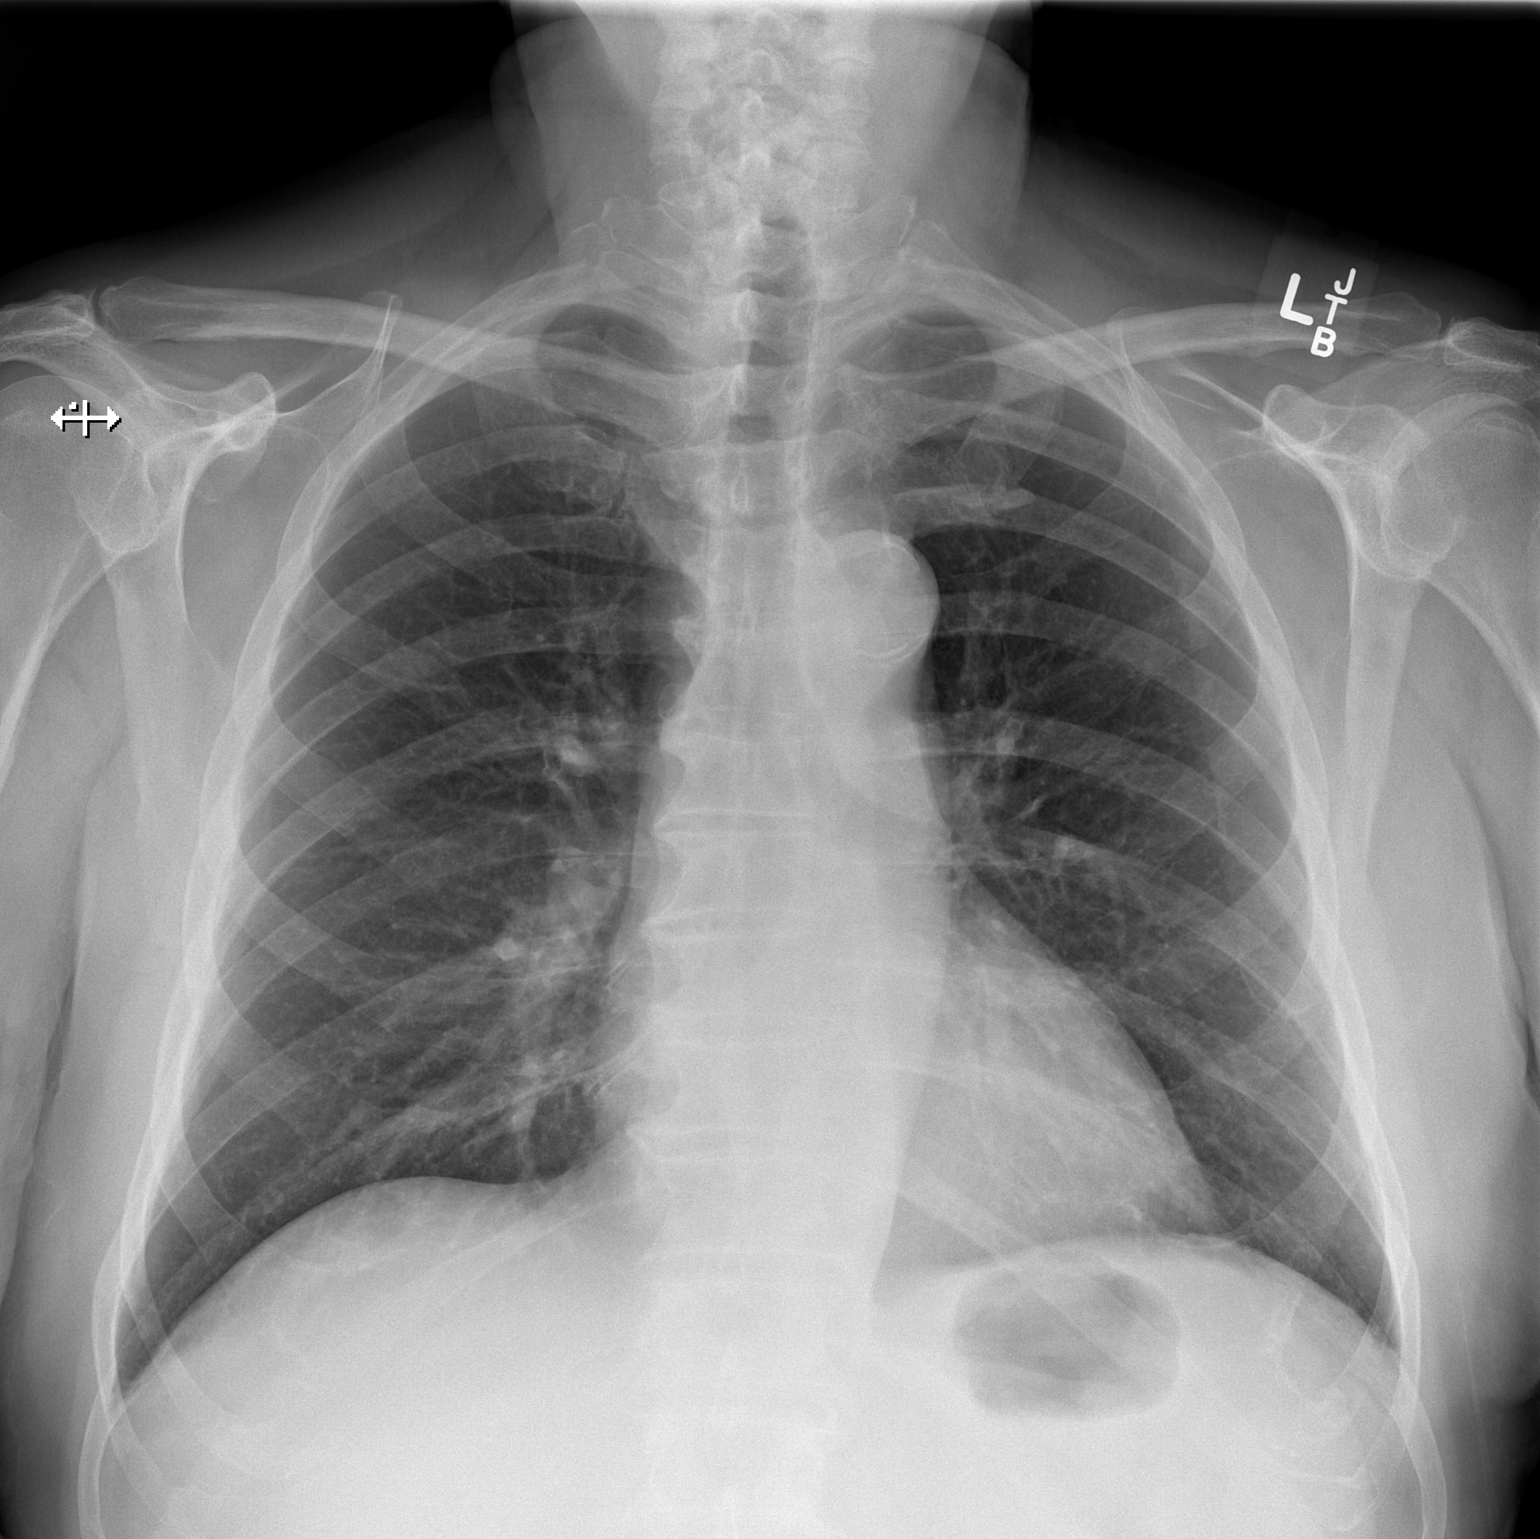

[w chest lat]
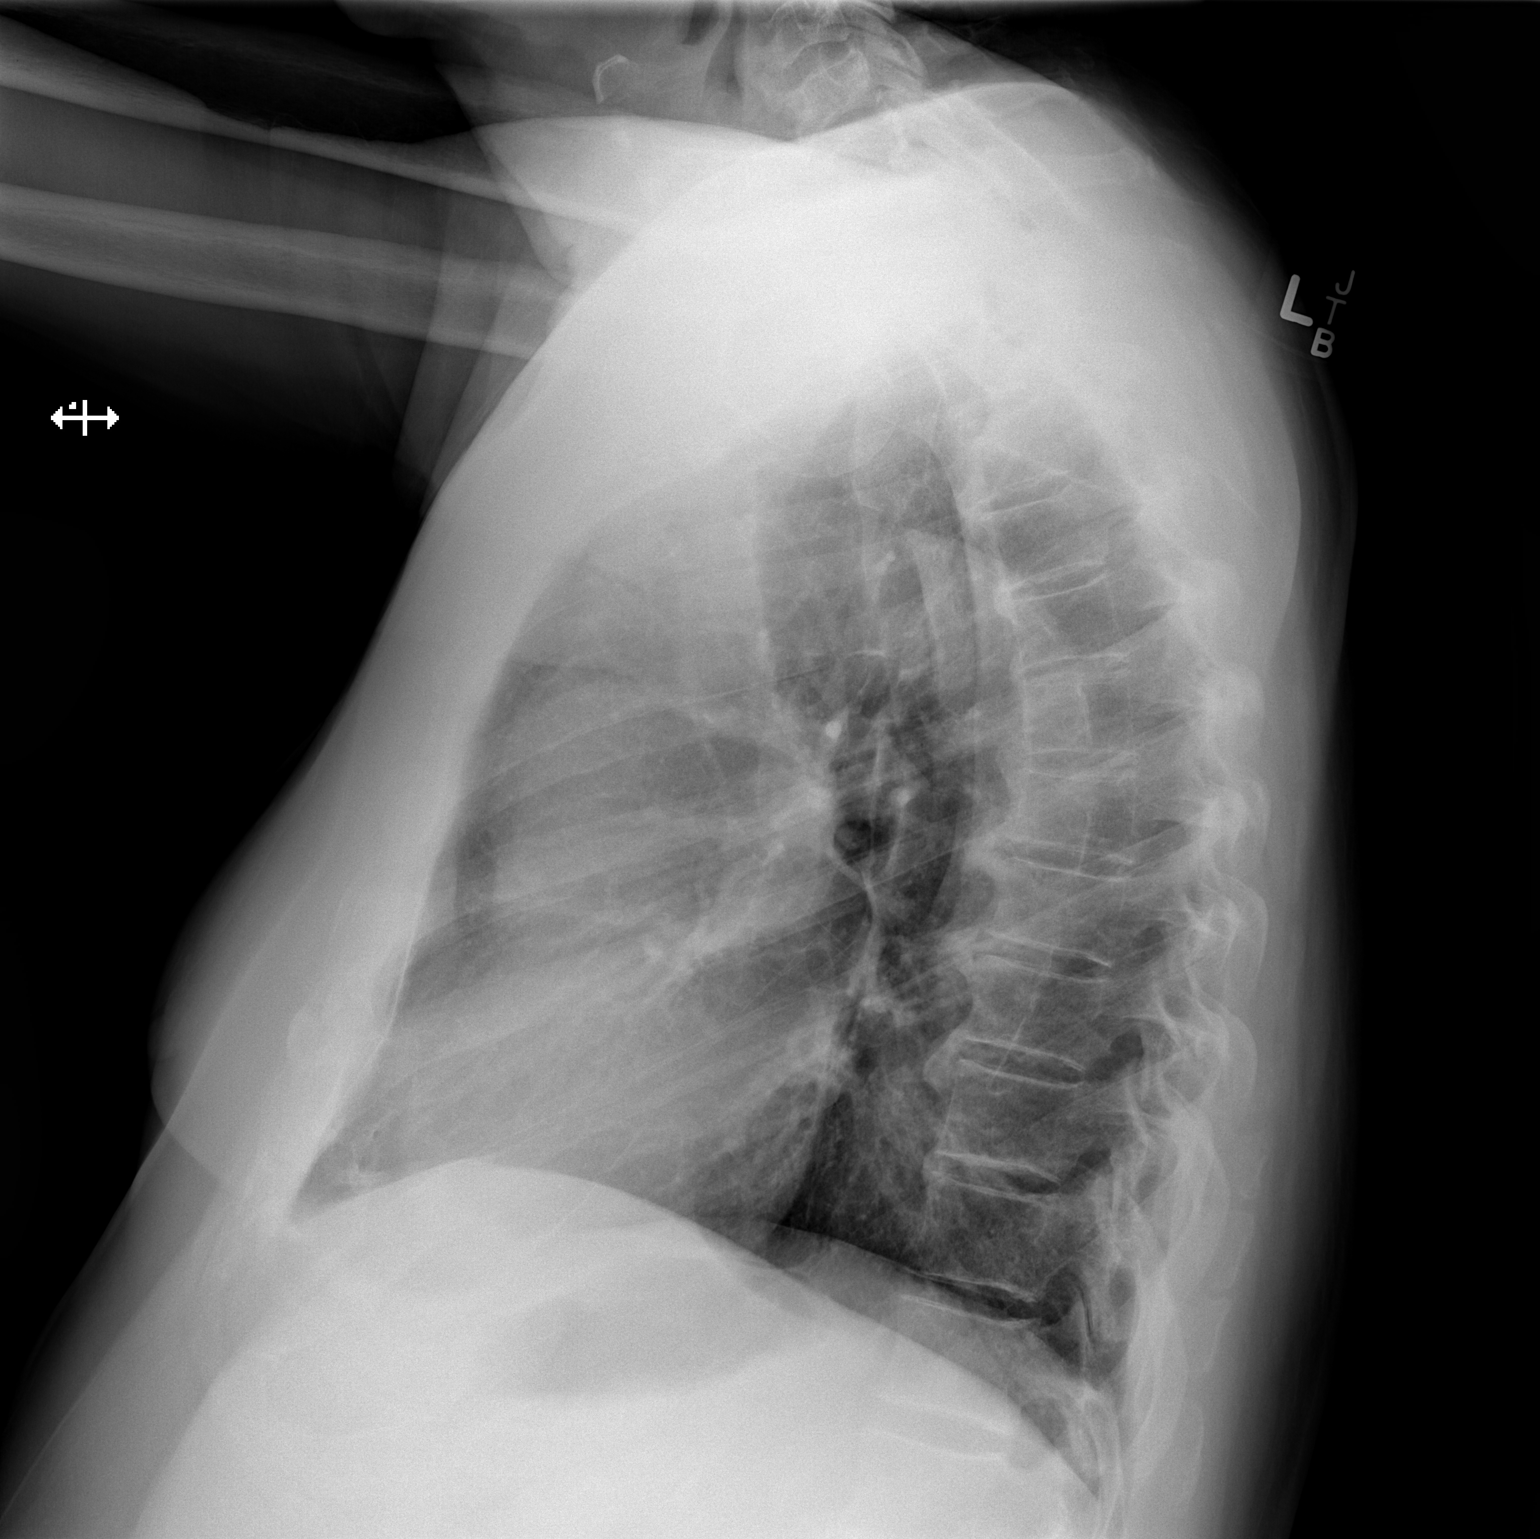

[2 of 2 positions shown; findings below may reference images not displayed]

FINDINGS: Heart size within normal limits. Atherosclerotic calcification
aortic arch. Vascularity normal. Lungs are clear without infiltrate
effusion or mass lesion. No skeletal abnormality.
IMPRESSION: No active cardiopulmonary disease.

Atherosclerotic aortic arch.

## 2017-12-04 DIAGNOSIS — D649 Anemia, unspecified: Secondary | ICD-10-CM | POA: Diagnosis not present

## 2017-12-04 DIAGNOSIS — E119 Type 2 diabetes mellitus without complications: Secondary | ICD-10-CM | POA: Diagnosis not present

## 2017-12-10 DIAGNOSIS — I10 Essential (primary) hypertension: Secondary | ICD-10-CM | POA: Diagnosis not present

## 2017-12-10 DIAGNOSIS — R739 Hyperglycemia, unspecified: Secondary | ICD-10-CM | POA: Diagnosis not present

## 2017-12-10 DIAGNOSIS — D649 Anemia, unspecified: Secondary | ICD-10-CM | POA: Diagnosis not present

## 2018-01-15 DIAGNOSIS — L570 Actinic keratosis: Secondary | ICD-10-CM | POA: Diagnosis not present

## 2018-01-15 DIAGNOSIS — L718 Other rosacea: Secondary | ICD-10-CM | POA: Diagnosis not present

## 2018-01-15 DIAGNOSIS — D1801 Hemangioma of skin and subcutaneous tissue: Secondary | ICD-10-CM | POA: Diagnosis not present

## 2018-01-15 DIAGNOSIS — D229 Melanocytic nevi, unspecified: Secondary | ICD-10-CM | POA: Diagnosis not present

## 2018-01-15 DIAGNOSIS — L814 Other melanin hyperpigmentation: Secondary | ICD-10-CM | POA: Diagnosis not present

## 2018-03-03 DIAGNOSIS — H2511 Age-related nuclear cataract, right eye: Secondary | ICD-10-CM | POA: Diagnosis not present

## 2018-03-05 DIAGNOSIS — Z298 Encounter for other specified prophylactic measures: Secondary | ICD-10-CM | POA: Diagnosis not present

## 2018-03-05 DIAGNOSIS — I10 Essential (primary) hypertension: Secondary | ICD-10-CM | POA: Diagnosis not present

## 2018-03-05 DIAGNOSIS — I2510 Atherosclerotic heart disease of native coronary artery without angina pectoris: Secondary | ICD-10-CM | POA: Diagnosis not present

## 2018-03-05 DIAGNOSIS — Z952 Presence of prosthetic heart valve: Secondary | ICD-10-CM | POA: Diagnosis not present

## 2018-03-10 DIAGNOSIS — D649 Anemia, unspecified: Secondary | ICD-10-CM | POA: Diagnosis not present

## 2018-03-10 DIAGNOSIS — I10 Essential (primary) hypertension: Secondary | ICD-10-CM | POA: Diagnosis not present

## 2018-03-10 DIAGNOSIS — R739 Hyperglycemia, unspecified: Secondary | ICD-10-CM | POA: Diagnosis not present

## 2018-03-19 DIAGNOSIS — I10 Essential (primary) hypertension: Secondary | ICD-10-CM | POA: Diagnosis not present

## 2018-03-19 DIAGNOSIS — R739 Hyperglycemia, unspecified: Secondary | ICD-10-CM | POA: Diagnosis not present

## 2018-03-19 DIAGNOSIS — D649 Anemia, unspecified: Secondary | ICD-10-CM | POA: Diagnosis not present

## 2018-03-19 DIAGNOSIS — Z23 Encounter for immunization: Secondary | ICD-10-CM | POA: Diagnosis not present

## 2018-05-10 ENCOUNTER — Other Ambulatory Visit: Payer: Self-pay | Admitting: Cardiovascular Disease

## 2018-05-10 MED ORDER — AMOXICILLIN 500 MG PO TABS
ORAL_TABLET | ORAL | 0 refills | Status: DC
Start: 2018-05-10 — End: 2021-01-11

## 2018-05-10 NOTE — Telephone Encounter (Signed)
This is fine thanks 

## 2018-05-10 NOTE — Telephone Encounter (Signed)
Pt calling requesting a refill on amoxicillin for dental appt on Wednesday, sent to [facility] on [address] Please address

## 2018-05-10 NOTE — Telephone Encounter (Signed)
Called pt and left message informing pt that his medication was sent to his pharmacy as requested. Confirmation received.

## 2018-05-11 DIAGNOSIS — Z23 Encounter for immunization: Secondary | ICD-10-CM | POA: Diagnosis not present

## 2018-05-11 DIAGNOSIS — I10 Essential (primary) hypertension: Secondary | ICD-10-CM | POA: Diagnosis not present

## 2018-06-04 DIAGNOSIS — E119 Type 2 diabetes mellitus without complications: Secondary | ICD-10-CM | POA: Diagnosis not present

## 2018-06-04 DIAGNOSIS — H2511 Age-related nuclear cataract, right eye: Secondary | ICD-10-CM | POA: Diagnosis not present

## 2018-06-04 DIAGNOSIS — I10 Essential (primary) hypertension: Secondary | ICD-10-CM | POA: Diagnosis not present

## 2018-06-24 DIAGNOSIS — H2511 Age-related nuclear cataract, right eye: Secondary | ICD-10-CM | POA: Diagnosis not present

## 2018-07-15 DIAGNOSIS — Z9849 Cataract extraction status, unspecified eye: Secondary | ICD-10-CM | POA: Diagnosis not present

## 2018-07-27 DIAGNOSIS — R739 Hyperglycemia, unspecified: Secondary | ICD-10-CM | POA: Diagnosis not present

## 2018-07-27 DIAGNOSIS — Z79899 Other long term (current) drug therapy: Secondary | ICD-10-CM | POA: Diagnosis not present

## 2018-07-27 DIAGNOSIS — I10 Essential (primary) hypertension: Secondary | ICD-10-CM | POA: Diagnosis not present

## 2018-07-27 DIAGNOSIS — Z0000 Encounter for general adult medical examination without abnormal findings: Secondary | ICD-10-CM | POA: Diagnosis not present

## 2018-07-27 DIAGNOSIS — E7800 Pure hypercholesterolemia, unspecified: Secondary | ICD-10-CM | POA: Diagnosis not present

## 2018-07-27 DIAGNOSIS — D649 Anemia, unspecified: Secondary | ICD-10-CM | POA: Diagnosis not present

## 2018-08-02 DIAGNOSIS — E7800 Pure hypercholesterolemia, unspecified: Secondary | ICD-10-CM | POA: Diagnosis not present

## 2018-08-02 DIAGNOSIS — Z0000 Encounter for general adult medical examination without abnormal findings: Secondary | ICD-10-CM | POA: Diagnosis not present

## 2018-09-03 DIAGNOSIS — D649 Anemia, unspecified: Secondary | ICD-10-CM | POA: Diagnosis not present

## 2018-09-03 DIAGNOSIS — E7800 Pure hypercholesterolemia, unspecified: Secondary | ICD-10-CM | POA: Diagnosis not present

## 2018-09-03 DIAGNOSIS — I10 Essential (primary) hypertension: Secondary | ICD-10-CM | POA: Diagnosis not present

## 2018-09-10 DIAGNOSIS — I10 Essential (primary) hypertension: Secondary | ICD-10-CM | POA: Diagnosis not present

## 2018-09-10 DIAGNOSIS — I2510 Atherosclerotic heart disease of native coronary artery without angina pectoris: Secondary | ICD-10-CM | POA: Diagnosis not present

## 2018-09-10 DIAGNOSIS — Z952 Presence of prosthetic heart valve: Secondary | ICD-10-CM | POA: Diagnosis not present

## 2018-09-10 DIAGNOSIS — Z298 Encounter for other specified prophylactic measures: Secondary | ICD-10-CM | POA: Diagnosis not present

## 2018-09-12 ENCOUNTER — Other Ambulatory Visit: Payer: Self-pay | Admitting: Cardiology

## 2018-09-12 DIAGNOSIS — Z952 Presence of prosthetic heart valve: Secondary | ICD-10-CM

## 2018-10-01 ENCOUNTER — Other Ambulatory Visit: Payer: Self-pay

## 2018-10-08 ENCOUNTER — Ambulatory Visit: Payer: Medicare Other

## 2018-10-08 ENCOUNTER — Other Ambulatory Visit: Payer: Self-pay

## 2018-10-08 DIAGNOSIS — Z952 Presence of prosthetic heart valve: Secondary | ICD-10-CM | POA: Diagnosis not present

## 2018-10-08 DIAGNOSIS — Z09 Encounter for follow-up examination after completed treatment for conditions other than malignant neoplasm: Secondary | ICD-10-CM | POA: Diagnosis not present

## 2018-10-22 DIAGNOSIS — D225 Melanocytic nevi of trunk: Secondary | ICD-10-CM | POA: Diagnosis not present

## 2018-10-22 DIAGNOSIS — L814 Other melanin hyperpigmentation: Secondary | ICD-10-CM | POA: Diagnosis not present

## 2018-10-22 DIAGNOSIS — L820 Inflamed seborrheic keratosis: Secondary | ICD-10-CM | POA: Diagnosis not present

## 2018-10-22 DIAGNOSIS — L718 Other rosacea: Secondary | ICD-10-CM | POA: Diagnosis not present

## 2018-10-22 DIAGNOSIS — L570 Actinic keratosis: Secondary | ICD-10-CM | POA: Diagnosis not present

## 2018-10-22 DIAGNOSIS — L821 Other seborrheic keratosis: Secondary | ICD-10-CM | POA: Diagnosis not present

## 2018-10-29 DIAGNOSIS — D649 Anemia, unspecified: Secondary | ICD-10-CM | POA: Diagnosis not present

## 2018-10-29 DIAGNOSIS — E119 Type 2 diabetes mellitus without complications: Secondary | ICD-10-CM | POA: Diagnosis not present

## 2018-10-29 DIAGNOSIS — E7800 Pure hypercholesterolemia, unspecified: Secondary | ICD-10-CM | POA: Diagnosis not present

## 2018-10-29 DIAGNOSIS — I10 Essential (primary) hypertension: Secondary | ICD-10-CM | POA: Diagnosis not present

## 2018-11-05 DIAGNOSIS — E119 Type 2 diabetes mellitus without complications: Secondary | ICD-10-CM | POA: Diagnosis not present

## 2018-11-05 DIAGNOSIS — E7800 Pure hypercholesterolemia, unspecified: Secondary | ICD-10-CM | POA: Diagnosis not present

## 2018-11-05 DIAGNOSIS — I10 Essential (primary) hypertension: Secondary | ICD-10-CM | POA: Diagnosis not present

## 2018-11-05 DIAGNOSIS — D649 Anemia, unspecified: Secondary | ICD-10-CM | POA: Diagnosis not present

## 2019-01-24 DIAGNOSIS — D225 Melanocytic nevi of trunk: Secondary | ICD-10-CM | POA: Diagnosis not present

## 2019-01-24 DIAGNOSIS — L820 Inflamed seborrheic keratosis: Secondary | ICD-10-CM | POA: Diagnosis not present

## 2019-01-24 DIAGNOSIS — L821 Other seborrheic keratosis: Secondary | ICD-10-CM | POA: Diagnosis not present

## 2019-03-01 ENCOUNTER — Other Ambulatory Visit: Payer: Self-pay

## 2019-03-01 NOTE — Patient Outreach (Signed)
[facility]) Care Management  03/01/2019  Marvin Houston 1933-10-11 [id number]   Medication Adherence call to Mr. Marvin Houston HIPPA Compliant Voice message left with a call back number. Marvin Houston is showing past due on Lovastatin 40 mg under [place].   [facility] [place] Management Direct Dial [phone]  Fax [phone] [email or web address]

## 2019-03-04 DIAGNOSIS — D649 Anemia, unspecified: Secondary | ICD-10-CM | POA: Diagnosis not present

## 2019-03-04 DIAGNOSIS — R739 Hyperglycemia, unspecified: Secondary | ICD-10-CM | POA: Diagnosis not present

## 2019-03-04 DIAGNOSIS — I10 Essential (primary) hypertension: Secondary | ICD-10-CM | POA: Diagnosis not present

## 2019-03-04 DIAGNOSIS — E538 Deficiency of other specified B group vitamins: Secondary | ICD-10-CM | POA: Diagnosis not present

## 2019-03-04 DIAGNOSIS — Z0000 Encounter for general adult medical examination without abnormal findings: Secondary | ICD-10-CM | POA: Diagnosis not present

## 2019-03-04 DIAGNOSIS — E785 Hyperlipidemia, unspecified: Secondary | ICD-10-CM | POA: Diagnosis not present

## 2019-03-11 DIAGNOSIS — E119 Type 2 diabetes mellitus without complications: Secondary | ICD-10-CM | POA: Diagnosis not present

## 2019-03-11 DIAGNOSIS — I10 Essential (primary) hypertension: Secondary | ICD-10-CM | POA: Diagnosis not present

## 2019-03-11 DIAGNOSIS — Z0000 Encounter for general adult medical examination without abnormal findings: Secondary | ICD-10-CM | POA: Diagnosis not present

## 2019-03-11 DIAGNOSIS — D649 Anemia, unspecified: Secondary | ICD-10-CM | POA: Diagnosis not present

## 2019-03-11 DIAGNOSIS — E785 Hyperlipidemia, unspecified: Secondary | ICD-10-CM | POA: Diagnosis not present

## 2019-03-18 ENCOUNTER — Other Ambulatory Visit: Payer: Self-pay

## 2019-03-18 NOTE — Patient Outreach (Signed)
[facility]) Care Management  03/18/2019  MANOLO BOSKET August 28, 1933 [id number]   Medication Adherence call to Mr. Marvin Houston [employer]a Identifiers Verify spoke with patient he is past due on Lovastatin 40 mg patient explain he is only taking 1 tablet two times a week not once daily patient has plenty at this time. Mr. Sauseda is showing past due under [place].   [facility] Management Direct Dial [phone]  Fax [phone] [email or web address]

## 2019-03-24 ENCOUNTER — Ambulatory Visit: Payer: Medicare Other | Admitting: Cardiology

## 2019-03-25 ENCOUNTER — Other Ambulatory Visit: Payer: Self-pay

## 2019-03-25 ENCOUNTER — Ambulatory Visit (INDEPENDENT_AMBULATORY_CARE_PROVIDER_SITE_OTHER): Payer: Medicare Other | Admitting: Cardiology

## 2019-03-25 ENCOUNTER — Encounter: Payer: Self-pay | Admitting: Cardiology

## 2019-03-25 VITALS — BP 140/60 | HR 68 | Ht 70.0 in | Wt 210.0 lb

## 2019-03-25 DIAGNOSIS — Z952 Presence of prosthetic heart valve: Secondary | ICD-10-CM | POA: Diagnosis not present

## 2019-03-25 DIAGNOSIS — Z298 Encounter for other specified prophylactic measures: Secondary | ICD-10-CM

## 2019-03-25 DIAGNOSIS — I77810 Thoracic aortic ectasia: Secondary | ICD-10-CM | POA: Diagnosis not present

## 2019-03-25 DIAGNOSIS — I2510 Atherosclerotic heart disease of native coronary artery without angina pectoris: Secondary | ICD-10-CM

## 2019-03-25 DIAGNOSIS — I10 Essential (primary) hypertension: Secondary | ICD-10-CM | POA: Diagnosis not present

## 2019-03-25 NOTE — Progress Notes (Signed)
Primary Physician/Referring:  Jani Gravel, MD  Patient ID: Marvin Houston, male    DOB: Sep 04, 1933, 85 y.o.   MRN: [id number]  Chief Complaint  Patient presents with  . Hypertension    6 month f/u   HPI:    [name]  is a 85 y.o. aortic stenosis that is S/P TAVR on 09/09/2016 and mild CAD, now presents for 1 year follow up.  He underwent echocardiogram on 10/08/2018 that showed progression of paravalvular regurgitation to moderate. Mild aortic root dilation at 4.9 cm.   Essentially asymptomatic. Patient reports on recent blood work he was found to have hemoglobin of 7.5. He did not notice any bleeding. On follow up labs, Hgb had improved to 10. Remains active and looks well. Previously noted leg edema improved with stopping amlodipine. He is now on Benicar and tolerating this well. He does notice some dyspnea on excertion with walking uphill that he states is chronic and unchanged.  Past Medical History:  Diagnosis Date  . Anemia   . Aortic stenosis   . Arthritis   . Diabetes mellitus without complication ([place])   . Dyspnea   . GI bleed    not yet determined cause  . History of recent blood transfusion 09/02/2016  . Hypertension   . Stenosis of aorta    Past Surgical History:  Procedure Laterality Date  . CARDIAC CATHETERIZATION    . HERNIA REPAIR    . TEE WITHOUT CARDIOVERSION N/A 07/29/2016   Procedure: TRANSESOPHAGEAL ECHOCARDIOGRAM (TEE);  Surgeon: Adrian Prows, MD;  Location: [place];  Service: Cardiovascular;  Laterality: N/A;  . TEE WITHOUT CARDIOVERSION N/A 09/09/2016   Procedure: TRANSESOPHAGEAL ECHOCARDIOGRAM (TEE);  Surgeon: Sherren Mocha, MD;  Location: [place] [facility];  Service: Open Heart Surgery;  Laterality: N/A;  . TRANSCATHETER AORTIC VALVE REPLACEMENT, TRANSFEMORAL Bilateral 09/09/2016   Procedure: TRANSCATHETER AORTIC VALVE REPLACEMENT, TRANSFEMORAL;  Surgeon: Sherren Mocha, MD;  Location: [place];  Service: Open Heart Surgery;  Laterality: Bilateral;   Social  History   Socioeconomic History  . Marital status: Married    Spouse name: Not on file  . Number of children: 2  . Years of education: Not on file  . Highest education level: Not on file  Occupational History  . Not on file  Social Needs  . Financial resource strain: Not on file  . Food insecurity    Worry: Not on file    Inability: Not on file  . Transportation needs    Medical: Not on file    Non-medical: Not on file  Tobacco Use  . Smoking status: Never Smoker  . Smokeless tobacco: Never Used  Substance and Sexual Activity  . Alcohol use: Yes  . Drug use: No  . Sexual activity: Not on file  Lifestyle  . Physical activity    Days per week: Not on file    Minutes per session: Not on file  . Stress: Not on file  Relationships  . Social Herbalist on phone: Not on file    Gets together: Not on file    Attends religious service: Not on file    Active member of club or organization: Not on file    Attends meetings of clubs or organizations: Not on file    Relationship status: Not on file  . Intimate partner violence    Fear of current or ex partner: Not on file    Emotionally abused: Not on file    Physically  abused: Not on file    Forced sexual activity: Not on file  Other Topics Concern  . Not on file  Social History Narrative  . Not on file   ROS  Review of Systems  Constitution: Negative for decreased appetite, malaise/fatigue, weight gain and weight loss.  Eyes: Negative for visual disturbance.  Cardiovascular: Positive for dyspnea on exertion (stable). Negative for chest pain, claudication, leg swelling, orthopnea, palpitations and syncope.  Respiratory: Negative for hemoptysis and wheezing.   Endocrine: Negative for cold intolerance and heat intolerance.  Hematologic/Lymphatic: Does not bruise/bleed easily.  Skin: Negative for nail changes.  Musculoskeletal: Negative for muscle weakness and myalgias.  Gastrointestinal: Negative for abdominal  pain, change in bowel habit, nausea and vomiting.  Neurological: Negative for difficulty with concentration, dizziness, focal weakness and headaches.  Psychiatric/Behavioral: Negative for altered mental status and suicidal ideas.  All other systems reviewed and are negative.  Objective  Blood pressure (!) 148/60, pulse 68, height '5\' 10"'  (1.778 m), weight 210 lb (95.3 kg), SpO2 98 %. Body mass index is 30.13 kg/m.   Physical Exam  Constitutional: He is oriented to person, place, and time. Vital signs are normal. He appears well-developed and well-nourished.  Well built, mildly obese  HENT:  Head: Normocephalic and atraumatic.  Neck: Normal range of motion.  Cardiovascular: Normal rate, regular rhythm and intact distal pulses.  Murmur heard.  Crescendo early systolic murmur is present with a grade of 2/6 at the apex radiating to the neck. Pulses:      Femoral pulses are 2+ on the right side and 2+ on the left side.      Popliteal pulses are 2+ on the right side and 1+ on the left side.       Dorsalis pedis pulses are 2+ on the right side and 1+ on the left side.       Posterior tibial pulses are 2+ on the right side and 1+ on the left side.  No edema  Pulmonary/Chest: Effort normal and breath sounds normal. No accessory muscle usage. No respiratory distress.  Abdominal: Soft. Bowel sounds are normal.  Musculoskeletal: Normal range of motion.  Neurological: He is alert and oriented to person, place, and time.  Skin: Skin is warm and dry.  Vitals reviewed.  Radiology: No results found.  Laboratory examination:   Labs 03/10/2018: RBC 3.17, hemoglobin 10.1, hematocrit 30, CBC otherwise normal. Creatinine 1.43, potassium 4.8, EGFR 46, CMP normal. Ferritin normal. Hemoglobin A1c 5.6%. Cholesterol 101, triglycerides 94, HDL 33, LDL 49. Iron studies normal.  No results for input(s): NA, K, CL, CO2, GLUCOSE, BUN, CREATININE, CALCIUM, GFRNONAA, GFRAA in the last 8760 hours. CMP Latest Ref  Rng & Units 09/11/2016 09/10/2016 09/09/2016  Glucose 65 - 99 mg/dL 124(H) 164(H) 146(H)  BUN 6 - 20 mg/dL 11 11 -  Creatinine 0.61 - 1.24 mg/dL 1.20 1.11 -  Sodium 135 - 145 mmol/L 142 137 143  Potassium 3.5 - 5.1 mmol/L 4.4 3.5 4.1  Chloride 101 - 111 mmol/L 111 106 -  CO2 22 - 32 mmol/L 26 24 -  Calcium 8.9 - 10.3 mg/dL 8.9 8.3(L) -  Total Protein 6.5 - 8.1 g/dL - - -  Total Bilirubin 0.3 - 1.2 mg/dL - - -  Alkaline Phos 38 - 126 U/L - - -  AST 15 - 41 U/L - - -  ALT 17 - 63 U/L - - -   CBC Latest Ref Rng & Units 09/11/2016 09/10/2016 09/09/2016  WBC 4.0 -  10.5 K/uL 5.6 6.7 5.3  Hemoglobin 13.0 - 17.0 g/dL 9.3(L) 8.8(L) 9.1(L)  Hematocrit 39.0 - 52.0 % 29.1(L) 27.9(L) 27.6(L)  Platelets 150 - 400 K/uL 102(L) 114(L) 106(L)   Lipid Panel  No results found for: CHOL, TRIG, HDL, CHOLHDL, VLDL, LDLCALC, LDLDIRECT HEMOGLOBIN A1C Lab Results  Component Value Date   HGBA1C 4.8 09/08/2016   MPG 91 09/08/2016   TSH No results for input(s): TSH in the last 8760 hours. Medications   Current Outpatient Medications  Medication Instructions  . amoxicillin (AMOXIL) 500 MG tablet Take 4 tablets by mouth one hour prior to dental appointment  . aspirin 81 mg, Oral, Daily  . Cyanocobalamin (VITAMIN B-12 PO) 1 tablet, Oral, Every evening  . ferrous sulfate 325 mg, Oral, 2 times daily with meals  . lisinopril (ZESTRIL) 40 mg, Oral, Every evening  . lovastatin (MEVACOR) 40 mg, Oral, 2 times weekly, Mon and Tues  . metFORMIN (GLUCOPHAGE) 500 mg, Oral, 2 times daily with meals  . Multiple Vitamin (MULTIVITAMIN WITH MINERALS) TABS tablet 1 tablet, Oral, Every evening  . Multiple Vitamins-Minerals (VISION VITAMINS PO) 1 tablet, Oral, Every evening  . psyllium (METAMUCIL) 58.6 % powder 1 packet, Oral, Daily  . sildenafil (VIAGRA) 100 mg, Oral, Once PRN    Cardiac Studies:   Coronary angiogram 08/12/2016: 60% to 70% smooth mid RCA stenosis, no change from 2007. Severe calcification of the AV annulus  and AV leaflets. Severe mitral annular calcification.   Echocardiogram  10/08/2018 : Left ventricle cavity is normal in size. Moderate concentric hypertrophy of the left ventricle. Normal global wall motion. Doppler evidence of grade II (pseudonormal) diastolic dysfunction. Diastolic dysfunction findings suggests elevated LA/LV end diastolic pressure. Calculated EF 55%. Left atrial cavity is mildly dilated at 4.3 cm. Bioprosthetic aortic valve with moderate (Grade II) para valvular regurgitation. Normal aortic valve leaflet mobility. No aortic stenosis. Normal mitral valve appearance with mild (Grade I) regurgitation. Mild tricuspid regurgitation. No evidence of pulmonary hypertension. The aortic root is mildly dilated at 4.9 cm. IVC is dilated with respiratory variation. May suggest elevated CVP. Compared to the report on 10/03/2017, aortic regurgitation was mild previously.  Assessment     ICD-10-CM   1. Atherosclerosis of native coronary artery of native heart without angina pectoris  I25.10   2. S/P TAVR (transcatheter aortic valve replacement)  Z95.2 EKG 12-Lead   09/09/2016 Edwards Sapien 3 transcatheter heart valve (size 46m, model #9600TFX, serial ##[id number]  3. SBE (subacute bacterial endocarditis) prophylaxis candidate  Z29.8   4. Essential hypertension  I10     EKG 03/25/2019: Sinus rhythm with first-degree AV block at the rate of 55 bpm, normal axis, IVCD, borderline criteria for LVH.  No evidence of ischemia. ABNORMAL  EKG  Recommendations:   Patient is here on annual visit and follow-up of aortic valve replacement by TAVR, presently doing well and essentially remains asymptomatic.  His blood pressure was elevated, he had reduce the dose of lisinopril from 40 mg to 20 mg due to dizziness on his last office visit and borderline low blood pressure, advised him to go back to being on 40 mg once daily.  He is severe coronary calcification but only moderate disease involving the  lumen of right coronary artery and he has not had any angina pectoris.  He also has aortic root and ascending thoracic aortic dilatation, markedly abnormal by echo but however CTA had revealed only a 4 cm dilatation.  We will consider repeating his CTA at some point  of doing a repeat echocardiogram in a year to follow-up on root dilatation. No change in murmur.   Miquel Dunn, MD, [facility] 03/25/2019, 12:04 PM [facility] [place] Pager: [phone] Office: [phone] If no answer Cell [phone]

## 2019-03-26 MED ORDER — LISINOPRIL 40 MG PO TABS: 40.0000 mg | ORAL_TABLET | Freq: Every evening | ORAL | Status: DC

## 2019-03-30 ENCOUNTER — Telehealth: Payer: Self-pay

## 2019-04-16 DIAGNOSIS — H2513 Age-related nuclear cataract, bilateral: Secondary | ICD-10-CM | POA: Diagnosis not present

## 2019-05-13 ENCOUNTER — Other Ambulatory Visit: Payer: Self-pay

## 2019-05-13 ENCOUNTER — Ambulatory Visit (INDEPENDENT_AMBULATORY_CARE_PROVIDER_SITE_OTHER): Payer: Medicare Other

## 2019-05-13 DIAGNOSIS — Z952 Presence of prosthetic heart valve: Secondary | ICD-10-CM | POA: Diagnosis not present

## 2019-05-13 DIAGNOSIS — I77810 Thoracic aortic ectasia: Secondary | ICD-10-CM | POA: Diagnosis not present

## 2019-05-20 DIAGNOSIS — Z23 Encounter for immunization: Secondary | ICD-10-CM | POA: Diagnosis not present

## 2019-08-19 DIAGNOSIS — E785 Hyperlipidemia, unspecified: Secondary | ICD-10-CM | POA: Diagnosis not present

## 2019-08-19 DIAGNOSIS — Z79899 Other long term (current) drug therapy: Secondary | ICD-10-CM | POA: Diagnosis not present

## 2019-08-19 DIAGNOSIS — I10 Essential (primary) hypertension: Secondary | ICD-10-CM | POA: Diagnosis not present

## 2019-08-19 DIAGNOSIS — D649 Anemia, unspecified: Secondary | ICD-10-CM | POA: Diagnosis not present

## 2019-08-19 DIAGNOSIS — E119 Type 2 diabetes mellitus without complications: Secondary | ICD-10-CM | POA: Diagnosis not present

## 2019-08-19 DIAGNOSIS — Z0000 Encounter for general adult medical examination without abnormal findings: Secondary | ICD-10-CM | POA: Diagnosis not present

## 2019-08-19 DIAGNOSIS — R739 Hyperglycemia, unspecified: Secondary | ICD-10-CM | POA: Diagnosis not present

## 2019-08-26 ENCOUNTER — Ambulatory Visit: Payer: Medicare Other | Attending: Internal Medicine

## 2019-08-26 DIAGNOSIS — E119 Type 2 diabetes mellitus without complications: Secondary | ICD-10-CM | POA: Diagnosis not present

## 2019-08-26 DIAGNOSIS — Z23 Encounter for immunization: Secondary | ICD-10-CM | POA: Insufficient documentation

## 2019-08-26 DIAGNOSIS — I10 Essential (primary) hypertension: Secondary | ICD-10-CM | POA: Diagnosis not present

## 2019-08-26 DIAGNOSIS — D649 Anemia, unspecified: Secondary | ICD-10-CM | POA: Diagnosis not present

## 2019-08-26 DIAGNOSIS — Z0000 Encounter for general adult medical examination without abnormal findings: Secondary | ICD-10-CM | POA: Diagnosis not present

## 2019-08-26 DIAGNOSIS — E785 Hyperlipidemia, unspecified: Secondary | ICD-10-CM | POA: Diagnosis not present

## 2019-08-26 NOTE — Progress Notes (Signed)
   Covid-19 Vaccination Clinic  Name:  Marvin Houston    MRN: [id number] DOB: 06-20-1934  08/26/2019  Marvin Houston was observed post Covid-19 immunization for 15 minutes without incidence. He was provided with Vaccine Information Sheet and instruction to access the V-Safe system.   Marvin Houston was instructed to call 911 with any severe reactions post vaccine: Marland Kitchen Difficulty breathing  . Swelling of your face and throat  . A fast heartbeat  . A bad rash all over your body  . Dizziness and weakness    Immunizations Administered    Name Date Dose VIS Date Route   Pfizer COVID-19 Vaccine 08/26/2019  9:42 AM 0.3 mL 07/22/2019 Intramuscular   Manufacturer: [place]   Lot: [id number]   [place]: [id number]

## 2019-09-01 ENCOUNTER — Other Ambulatory Visit: Payer: Self-pay | Admitting: Cardiovascular Disease

## 2019-09-06 ENCOUNTER — Telehealth: Payer: Self-pay | Admitting: *Deleted

## 2019-09-06 NOTE — Telephone Encounter (Signed)
Covid vacciine 2nd dose appt rescheduled as requested

## 2019-09-13 ENCOUNTER — Ambulatory Visit: Payer: Medicare Other | Attending: Internal Medicine

## 2019-09-13 ENCOUNTER — Ambulatory Visit: Payer: Medicare Other

## 2019-09-13 DIAGNOSIS — Z23 Encounter for immunization: Secondary | ICD-10-CM | POA: Insufficient documentation

## 2019-09-13 NOTE — Progress Notes (Signed)
   Covid-19 Vaccination Clinic  Name:  Marvin Houston    MRN: [id number] DOB: 1934/03/01  09/13/2019  [name] was observed post Covid-19 immunization for 15 minutes without incidence. He was provided with Vaccine Information Sheet and instruction to access the V-Safe system.   [name] was instructed to call 911 with any severe reactions post vaccine: Marland Kitchen Difficulty breathing  . Swelling of your face and throat  . A fast heartbeat  . A bad rash all over your body  . Dizziness and weakness    Immunizations Administered    Name Date Dose VIS Date Route   Pfizer COVID-19 Vaccine 09/13/2019  9:12 AM 0.3 mL 07/22/2019 Intramuscular   Manufacturer: [place]   Lot: =   NDC: [id number]

## 2019-11-25 DIAGNOSIS — L821 Other seborrheic keratosis: Secondary | ICD-10-CM | POA: Diagnosis not present

## 2019-11-25 DIAGNOSIS — L820 Inflamed seborrheic keratosis: Secondary | ICD-10-CM | POA: Diagnosis not present

## 2019-11-25 DIAGNOSIS — L570 Actinic keratosis: Secondary | ICD-10-CM | POA: Diagnosis not present

## 2019-11-25 DIAGNOSIS — D1801 Hemangioma of skin and subcutaneous tissue: Secondary | ICD-10-CM | POA: Diagnosis not present

## 2019-11-25 DIAGNOSIS — D225 Melanocytic nevi of trunk: Secondary | ICD-10-CM | POA: Diagnosis not present

## 2019-12-23 DIAGNOSIS — L820 Inflamed seborrheic keratosis: Secondary | ICD-10-CM | POA: Diagnosis not present

## 2020-02-17 DIAGNOSIS — I10 Essential (primary) hypertension: Secondary | ICD-10-CM | POA: Diagnosis not present

## 2020-02-17 DIAGNOSIS — D649 Anemia, unspecified: Secondary | ICD-10-CM | POA: Diagnosis not present

## 2020-02-17 DIAGNOSIS — E118 Type 2 diabetes mellitus with unspecified complications: Secondary | ICD-10-CM | POA: Diagnosis not present

## 2020-02-24 DIAGNOSIS — D509 Iron deficiency anemia, unspecified: Secondary | ICD-10-CM | POA: Diagnosis not present

## 2020-02-24 DIAGNOSIS — I10 Essential (primary) hypertension: Secondary | ICD-10-CM | POA: Diagnosis not present

## 2020-02-24 DIAGNOSIS — E119 Type 2 diabetes mellitus without complications: Secondary | ICD-10-CM | POA: Diagnosis not present

## 2020-02-24 DIAGNOSIS — R739 Hyperglycemia, unspecified: Secondary | ICD-10-CM | POA: Diagnosis not present

## 2020-02-24 DIAGNOSIS — E7800 Pure hypercholesterolemia, unspecified: Secondary | ICD-10-CM | POA: Diagnosis not present

## 2020-03-16 ENCOUNTER — Ambulatory Visit: Payer: Medicare Other | Admitting: Cardiology

## 2020-03-26 ENCOUNTER — Ambulatory Visit: Payer: Medicare Other | Admitting: Cardiology

## 2020-03-28 ENCOUNTER — Ambulatory Visit: Payer: Medicare Other | Admitting: Cardiology

## 2020-03-29 DIAGNOSIS — Z20822 Contact with and (suspected) exposure to covid-19: Secondary | ICD-10-CM | POA: Diagnosis not present

## 2020-03-29 DIAGNOSIS — Z9189 Other specified personal risk factors, not elsewhere classified: Secondary | ICD-10-CM | POA: Diagnosis not present

## 2020-04-06 ENCOUNTER — Ambulatory Visit: Payer: Medicare Other | Admitting: Cardiology

## 2020-04-06 ENCOUNTER — Other Ambulatory Visit: Payer: Self-pay

## 2020-04-06 ENCOUNTER — Encounter: Payer: Self-pay | Admitting: Cardiology

## 2020-04-06 VITALS — BP 163/74 | HR 64 | Resp 17 | Ht 70.0 in | Wt 217.0 lb

## 2020-04-06 DIAGNOSIS — I10 Essential (primary) hypertension: Secondary | ICD-10-CM | POA: Diagnosis not present

## 2020-04-06 DIAGNOSIS — T8203XS Leakage of heart valve prosthesis, sequela: Secondary | ICD-10-CM

## 2020-04-06 DIAGNOSIS — N1832 Chronic kidney disease, stage 3b: Secondary | ICD-10-CM | POA: Diagnosis not present

## 2020-04-06 DIAGNOSIS — Z298 Encounter for other specified prophylactic measures: Secondary | ICD-10-CM | POA: Diagnosis not present

## 2020-04-06 DIAGNOSIS — Z952 Presence of prosthetic heart valve: Secondary | ICD-10-CM | POA: Diagnosis not present

## 2020-04-06 DIAGNOSIS — I2510 Atherosclerotic heart disease of native coronary artery without angina pectoris: Secondary | ICD-10-CM

## 2020-04-06 MED ORDER — AMLODIPINE BESYLATE 5 MG PO TABS: 5.0000 mg | ORAL_TABLET | Freq: Every day | ORAL | 2 refills | Status: DC

## 2020-04-06 NOTE — Progress Notes (Signed)
Primary Physician/Referring:  Jani Gravel, MD  Patient ID: Marvin Houston, male    DOB: [birth year], 86 y.o.   MRN: [id number]  Chief Complaint  Patient presents with  . prosthetic AV  . Follow-up   HPI:    [name]  is a 86 y.o. aortic stenosis that is S/P TAVR on 09/09/2016 and mild CAD, moderate paravalvular regurgitation, aortic root dilation at 4.9 cm. Essentially asymptomatic.   Previously noted leg edema improved with stopping amlodipine. He is now on Benicar HCT and tolerating this well (he is not completely sure if taking lisinopril or Benicar HCT).. He does notice some dyspnea on excertion with walking uphill that he states is chronic and unchanged.  Past Medical History:  Diagnosis Date  . Anemia   . Aortic stenosis   . Arthritis   . Diabetes mellitus without complication ([place])   . Dyspnea   . GI bleed    not yet determined cause  . History of recent blood transfusion 09/02/2016  . Hypertension   . Stenosis of aorta    Past Surgical History:  Procedure Laterality Date  . CARDIAC CATHETERIZATION    . HERNIA REPAIR    . TEE WITHOUT CARDIOVERSION N/A 07/29/2016   Procedure: TRANSESOPHAGEAL ECHOCARDIOGRAM (TEE);  Surgeon: Adrian Prows, MD;  Location: [place];  Service: Cardiovascular;  Laterality: N/A;  . TEE WITHOUT CARDIOVERSION N/A 09/09/2016   Procedure: TRANSESOPHAGEAL ECHOCARDIOGRAM (TEE);  Surgeon: Sherren Mocha, MD;  Location: [facility];  Service: Open Heart Surgery;  Laterality: N/A;  . TRANSCATHETER AORTIC VALVE REPLACEMENT, TRANSFEMORAL Bilateral 09/09/2016   Procedure: TRANSCATHETER AORTIC VALVE REPLACEMENT, TRANSFEMORAL;  Surgeon: Sherren Mocha, MD;  Location: [facility];  Service: Open Heart Surgery;  Laterality: Bilateral;   Social History   Tobacco Use  . Smoking status: Former Smoker    Packs/day: 1.00    Years: 10.00    Pack years: 10.00    Types: Cigarettes    Quit date: 1986    Years since quitting: 35.6  . Smokeless tobacco: Never Used   Substance Use Topics  . Alcohol use: Yes    Comment: wine nightly   Marital Status: Married   ROS  Review of Systems  Cardiovascular: Positive for dyspnea on exertion. Negative for chest pain and leg swelling.  Gastrointestinal: Negative for melena.   Objective  Blood pressure (!) 163/74, pulse 64, resp. rate 17, height 5' 10" (1.778 m), weight 217 lb (98.4 kg), SpO2 100 %. Body mass index is 31.14 kg/m.   Physical Exam Vitals reviewed.  Constitutional:      Appearance: He is well-developed.     Comments: Well built, mildly obese  HENT:     Head: Normocephalic.  Cardiovascular:     Rate and Rhythm: Normal rate and regular rhythm.     Pulses: Intact distal pulses.          Femoral pulses are 2+ on the right side and 2+ on the left side.      Popliteal pulses are 2+ on the right side and 1+ on the left side.       Dorsalis pedis pulses are 2+ on the right side and 1+ on the left side.       Posterior tibial pulses are 2+ on the right side and 1+ on the left side.     Heart sounds: Murmur heard.  Crescendo early systolic murmur is present with a grade of 2/6 at the apex radiating to the neck.  Blowing  decrescendo early diastolic murmur is present at the lower left sternal border.      Comments: No edema Pulmonary:     Effort: Pulmonary effort is normal. No accessory muscle usage or respiratory distress.     Breath sounds: Normal breath sounds.  Abdominal:     General: Bowel sounds are normal.     Palpations: Abdomen is soft.  Neurological:     Mental Status: He is alert and oriented to person, place, and time.    Radiology: No results found.  Laboratory examination:   External labs:  Hb 11.1/HCT 32.7, platelets 129.  Normal indicis.  Serum glucose 158, BUN 20, creatinine 1.47, EGFR 43 mL.  Potassium 4.5.  CMP otherwise normal.  A1c 5.8%.  Cholesterol, total 114.000 02/17/2020 HDL 34.000 02/17/2020 LDL-C 57.000 02/17/2020 Triglycerides 125.000 02/17/2020  Labs  03/10/2018: RBC 3.17, hemoglobin 10.1, hematocrit 30, CBC otherwise normal. Creatinine 1.43, potassium 4.8, EGFR 46, CMP normal. Ferritin normal. Hemoglobin A1c 5.6%. Cholesterol 101, triglycerides 94, HDL 33, LDL 49. Iron studies normal.  Medications   Outpatient Medications Prior to Visit  Medication Sig Dispense Refill  . amoxicillin (AMOXIL) 500 MG tablet Take 4 tablets by mouth one hour prior to dental appointment 8 tablet 0  . aspirin EC 81 MG EC tablet Take 1 tablet (81 mg total) by mouth daily.    . Cyanocobalamin (VITAMIN B-12 PO) Take 1 tablet by mouth every evening.     . ferrous sulfate 325 (65 FE) MG tablet Take 1 tablet (325 mg total) by mouth 2 (two) times daily with a meal. 60 tablet 12  . lovastatin (MEVACOR) 40 MG tablet Take 40 mg by mouth 2 (two) times a week. Mon and Tues    . metFORMIN (GLUCOPHAGE) 500 MG tablet Take 500 mg by mouth 2 (two) times daily with a meal.     . Multiple Vitamin (MULTIVITAMIN WITH MINERALS) TABS tablet Take 1 tablet by mouth every evening.     . Multiple Vitamins-Minerals (VISION VITAMINS PO) Take 1 tablet by mouth every evening.     . psyllium (METAMUCIL) 58.6 % powder Take 1 packet by mouth daily.    Marland Kitchen lisinopril (ZESTRIL) 40 MG tablet Take 1 tablet (40 mg total) by mouth every evening.    . olmesartan-hydrochlorothiazide (BENICAR HCT) 20-12.5 MG tablet Take 1 tablet by mouth daily.    . sildenafil (VIAGRA) 100 MG tablet Take 100 mg by mouth once as needed for erectile dysfunction.     No facility-administered medications prior to visit.   Meds ordered this encounter  Medications  . amLODipine (NORVASC) 5 MG tablet    Sig: Take 1 tablet (5 mg total) by mouth daily.    Dispense:  30 tablet    Refill:  2   Medications Discontinued During This Encounter  Medication Reason  . sildenafil (VIAGRA) 100 MG tablet Patient Preference  . lisinopril (ZESTRIL) 40 MG tablet Discontinued by provider     Cardiac Studies:   Coronary angiogram  08/12/2016: 60% to 70% smooth mid RCA stenosis, no change from 2007. Severe calcification of the AV annulus and AV leaflets. Severe mitral annular calcification.   Echocardiogram 05/13/2019:  Normal LV systolic function with EF 56%. Mild concentric hypertrophy of  the left ventricle. Left ventricle cavity is normal in size. Normal global  wall motion. Doppler evidence of grade I (impaired) diastolic dysfunction,  normal LAP.  Left atrial cavity is mildly dilated.  Bioprosthetic aortic valve. Mild aortic valve stenosis. Aortic valve mean  gradient of 12 mmHg, Vmax of 2.4 m/s. Calculated aortic valve area by  continuity equation is 1.2 cm. Moderate (Grade II) paravalvular aortic  regurgitation.  Mild to moderate mitral regurgitation.  Mild pulmonic regurgitation.  IVC is dilated with a respiratory response of >50%. Estimated RA pressure  8 mmHg.  No significant change compared to previous study on 10/08/2018.   EKG:  EKG 04/06/2020: Sinus rhythm with first-degree block at rate of 64 bpm, left atrial enlargement, normal axis.  IVCD, borderline criteria for LVH.  Normal QT interval, no evidence of ischemia.  No significant change from 03/25/2019.  Assessment     ICD-10-CM   1. Atherosclerosis of native coronary artery of native heart without angina pectoris  I25.10 EKG 12-Lead    PCV ECHOCARDIOGRAM COMPLETE  2. S/P TAVR (transcatheter aortic valve replacement)  Z95.2 PCV ECHOCARDIOGRAM COMPLETE  3. Paravalvular leak (prosthetic valve), sequela  T82.03XS PCV ECHOCARDIOGRAM COMPLETE  4. Essential hypertension  I10 olmesartan-hydrochlorothiazide (BENICAR HCT) 20-12.5 MG tablet    amLODipine (NORVASC) 5 MG tablet  5. SBE (subacute bacterial endocarditis) prophylaxis candidate  Z29.8   6. Stage 3b chronic kidney disease  N18.32      Recommendations:   ROBERT SPERL  is a 86 y.o.  aortic stenosis that is S/P TAVR on 09/09/2016 and mild CAD, moderate paravalvular regurgitation, aortic root  dilation at 4.9 cm. Essentially asymptomatic.   Previously noted leg edema improved with stopping amlodipine. He is now on Benicar HCT and tolerating this well (he is not completely sure if taking lisinopril or Benicar HCT).. He does notice some dyspnea on excertion with walking uphill that he states is chronic and unchanged.  I would like to repeat his echocardiogram in view of moderate aortic root dilatation noted at 4.8 to 4.9 cm, also to follow-up on perivalvular aortic leak.  His aortic regurgitation murmur appears prominent.  His elevated systolic blood pressure could be related to aortic regurgitation.  Today he is not sure of his blood pressure medications, he will bring the list back to me on his next office visit in 6 weeks.  For now advised him to continue Benicar HCT.  Blood pressure is elevated, previously he had developed leg edema with amlodipine but I will try to put him on low-dose amlodipine as his blood pressure is elevated today.  I reviewed his labs, he does have stage III chronic kidney disease, hemoglobin is improved, otherwise labs are stable and lipids are well controlled.  This was a 40-minute office visit encounter.   Adrian Prows, MD, [facility] 04/06/2020, 1:51 PM Office: [phone]

## 2020-04-06 NOTE — Telephone Encounter (Signed)
From patient.

## 2020-04-20 ENCOUNTER — Ambulatory Visit: Payer: Medicare Other

## 2020-04-20 ENCOUNTER — Other Ambulatory Visit: Payer: Self-pay

## 2020-04-20 DIAGNOSIS — Z952 Presence of prosthetic heart valve: Secondary | ICD-10-CM | POA: Diagnosis not present

## 2020-04-20 DIAGNOSIS — I2510 Atherosclerotic heart disease of native coronary artery without angina pectoris: Secondary | ICD-10-CM

## 2020-04-20 DIAGNOSIS — T8203XS Leakage of heart valve prosthesis, sequela: Secondary | ICD-10-CM

## 2020-05-09 DIAGNOSIS — E119 Type 2 diabetes mellitus without complications: Secondary | ICD-10-CM | POA: Diagnosis not present

## 2020-05-25 ENCOUNTER — Encounter: Payer: Self-pay | Admitting: Cardiology

## 2020-05-25 ENCOUNTER — Ambulatory Visit: Payer: Medicare Other | Admitting: Cardiology

## 2020-05-25 ENCOUNTER — Other Ambulatory Visit: Payer: Self-pay

## 2020-05-25 VITALS — BP 138/72 | HR 75 | Resp 16 | Ht 70.0 in | Wt 212.0 lb

## 2020-05-25 DIAGNOSIS — Z952 Presence of prosthetic heart valve: Secondary | ICD-10-CM | POA: Diagnosis not present

## 2020-05-25 DIAGNOSIS — I2510 Atherosclerotic heart disease of native coronary artery without angina pectoris: Secondary | ICD-10-CM | POA: Diagnosis not present

## 2020-05-25 DIAGNOSIS — I10 Essential (primary) hypertension: Secondary | ICD-10-CM | POA: Diagnosis not present

## 2020-05-25 DIAGNOSIS — N1832 Chronic kidney disease, stage 3b: Secondary | ICD-10-CM | POA: Diagnosis not present

## 2020-05-25 DIAGNOSIS — T8203XS Leakage of heart valve prosthesis, sequela: Secondary | ICD-10-CM

## 2020-05-25 MED ORDER — ATENOLOL 50 MG PO TABS: 50.0000 mg | ORAL_TABLET | Freq: Every day | ORAL | 2 refills | Status: DC

## 2020-05-25 NOTE — Progress Notes (Signed)
Primary Physician/Referring:  Jani Gravel, MD  Patient ID: Marvin Houston, male    DOB: July 23, 1934, 86 y.o.   MRN: [id number]  Chief Complaint  Patient presents with  . Hypertension  . Results    Labs and Echo   HPI:    [name]  is a 86 y.o. aortic stenosis that is S/P TAVR on 09/09/2016 and mild CAD, moderate paravalvular regurgitation, aortic root dilation at 4.9 cm.   Patient presents for 6-week follow-up, and is doing well essentially asymptomatic.  At last visit restarted patient on amlodipine 5 mg due to elevated blood pressure.  He reports he has had some mild bilateral leg edema, which improves with elevation of his legs.  Continues to note mild exertional dyspnea which is chronic and stable.  Patient brings with him home blood pressure log with systolic readings ranging 135-145.  He denies chest pain, shortness of breath, palpitations, PND, orthopnea, syncope.  Past Medical History:  Diagnosis Date  . Anemia   . Aortic stenosis   . Arthritis   . Diabetes mellitus without complication ([place])   . Dyspnea   . GI bleed    not yet determined cause  . History of recent blood transfusion 09/02/2016  . Hypertension   . Stenosis of aorta    Past Surgical History:  Procedure Laterality Date  . CARDIAC CATHETERIZATION    . HERNIA REPAIR    . TEE WITHOUT CARDIOVERSION N/A 07/29/2016   Procedure: TRANSESOPHAGEAL ECHOCARDIOGRAM (TEE);  Surgeon: Adrian Prows, MD;  Location: [facility];  Service: Cardiovascular;  Laterality: N/A;  . TEE WITHOUT CARDIOVERSION N/A 09/09/2016   Procedure: TRANSESOPHAGEAL ECHOCARDIOGRAM (TEE);  Surgeon: Sherren Mocha, MD;  Location: [facility];  Service: Open Heart Surgery;  Laterality: N/A;  . TRANSCATHETER AORTIC VALVE REPLACEMENT, TRANSFEMORAL Bilateral 09/09/2016   Procedure: TRANSCATHETER AORTIC VALVE REPLACEMENT, TRANSFEMORAL;  Surgeon: Sherren Mocha, MD;  Location: [facility];  Service: Open Heart Surgery;  Laterality: Bilateral;   Social History    Tobacco Use  . Smoking status: Former Smoker    Packs/day: 1.00    Years: 10.00    Pack years: 10.00    Types: Cigarettes    Quit date: 1986    Years since quitting: 35.8  . Smokeless tobacco: Never Used  Substance Use Topics  . Alcohol use: Yes    Comment: wine nightly   Marital Status: Married   ROS  Review of Systems  Cardiovascular: Positive for dyspnea on exertion (chronic, stable ). Negative for chest pain, claudication, leg swelling, orthopnea, palpitations, paroxysmal nocturnal dyspnea and syncope.  Gastrointestinal: Negative for melena.  Neurological: Negative for dizziness.   Objective  Blood pressure 138/72, pulse 75, resp. rate 16, height _0  (1.778 m), weight 212 lb (96.2 kg), SpO2 99 %. Body mass index is 30.42 kg/m.   Physical Exam Vitals reviewed.  Constitutional:      Appearance: He is well-developed.     Comments: Well built, mildly obese  HENT:     Head: Normocephalic.  Cardiovascular:     Rate and Rhythm: Normal rate and regular rhythm.     Pulses: Intact distal pulses.          Femoral pulses are 2+ on the right side and 2+ on the left side.      Popliteal pulses are 2+ on the right side and 1+ on the left side.       Dorsalis pedis pulses are 2+ on the right side and 1+ on  the left side.       Posterior tibial pulses are 2+ on the right side and 1+ on the left side.     Heart sounds: Murmur heard.  Crescendo early systolic murmur is present with a grade of 2/6 at the apex radiating to the neck.  Blowing decrescendo early diastolic murmur is present at the lower left sternal border.      Comments: Trace edema right lower leg. 1+ pitting edema left lower leg.  Pulmonary:     Effort: Pulmonary effort is normal. No accessory muscle usage or respiratory distress.     Breath sounds: Normal breath sounds.  Neurological:     Mental Status: He is alert and oriented to person, place, and time.     Laboratory examination:   External  labs: 02/17/2020: Hb 11.1/HCT 32.7, platelets 129.  Normal indicis. Serum glucose 158, BUN 20, creatinine 1.47, EGFR 43 mL.  Potassium 4.5.  CMP otherwise normal. A1c 5.8%. Cholesterol, total 114.000 02/17/2020 HDL 34.000 02/17/2020 LDL-C 57.000 02/17/2020 Triglycerides 125.000 02/17/2020  Labs 03/10/2018: RBC 3.17, hemoglobin 10.1, hematocrit 30, CBC otherwise normal. Creatinine 1.43, potassium 4.8, EGFR 46, CMP normal. Ferritin normal. Hemoglobin A1c 5.6%. Cholesterol 101, triglycerides 94, HDL 33, LDL 49. Iron studies normal.  Medications   Outpatient Medications Prior to Visit  Medication Sig Dispense Refill  . amLODipine (NORVASC) 5 MG tablet Take 1 tablet (5 mg total) by mouth daily. 30 tablet 2  . aspirin EC 81 MG EC tablet Take 1 tablet (81 mg total) by mouth daily.    . Cyanocobalamin (VITAMIN B-12 PO) Take 1 tablet by mouth every evening.     . ferrous sulfate 325 (65 FE) MG tablet Take 1 tablet (325 mg total) by mouth 2 (two) times daily with a meal. 60 tablet 12  . lovastatin (MEVACOR) 40 MG tablet Take 40 mg by mouth 2 (two) times a week. Mon and Tues    . metFORMIN (GLUCOPHAGE) 500 MG tablet Take 500 mg by mouth 2 (two) times daily with a meal.     . Multiple Vitamin (MULTIVITAMIN WITH MINERALS) TABS tablet Take 1 tablet by mouth every evening.     . Multiple Vitamins-Minerals (VISION VITAMINS PO) Take 1 tablet by mouth every evening.     . olmesartan-hydrochlorothiazide (BENICAR HCT) 20-12.5 MG tablet Take 1 tablet by mouth daily.    . psyllium (METAMUCIL) 58.6 % powder Take 1 packet by mouth daily.    Marland Kitchen amoxicillin (AMOXIL) 500 MG tablet Take 4 tablets by mouth one hour prior to dental appointment (Patient not taking: Reported on 05/25/2020) 8 tablet 0   No facility-administered medications prior to visit.   Meds ordered this encounter  Medications  . atenolol (TENORMIN) 50 MG tablet    Sig: Take 1 tablet (50 mg total) by mouth daily.    Dispense:  30 tablet    Refill:  2    There are no discontinued medications.     Radiology:  No results found.  Cardiac Studies:   Coronary angiogram 08/12/2016: 60% to 70% smooth mid RCA stenosis, no change from 2007. Severe calcification of the AV annulus and AV leaflets. Severe mitral annular calcification.  Echocardiogram 05/13/2019:  Normal LV systolic function with EF 56%. Mild concentric hypertrophy of  the left ventricle. Left ventricle cavity is normal in size. Normal global  wall motion. Doppler evidence of grade I (impaired) diastolic dysfunction,  normal LAP.  Left atrial cavity is mildly dilated.  Bioprosthetic aortic valve. Mild  aortic valve stenosis. Aortic valve mean  gradient of 12 mmHg, Vmax of 2.4 m/s. Calculated aortic valve area by  continuity equation is 1.2 cm. Moderate (Grade II) paravalvular aortic  regurgitation.  Mild to moderate mitral regurgitation.  Mild pulmonic regurgitation.  IVC is dilated with a respiratory response of >50%. Estimated RA pressure  8 mmHg.  No significant change compared to previous study on 10/08/2018.   PCV ECHOCARDIOGRAM COMPLETE 04/20/2020  Narrative Echocardiogram 04/20/2020: Normal LV systolic function with visual EF 55-60%. Left ventricle cavity is normal in size. Normal left ventricular wall thickness. Normal global wall motion. Normal diastolic filling pattern. Bioprosthetic trileaflet aortic valve.  Mild to moderate perivalvular aortic regurgitation. Mildly restricted aortic valve leaflets. Trace aortic stenosis. Aortic valve peak pressure gradient of 28.2 and mean gradient of 14.6 mmHg, calculated aortic valve area 1.15 cm. Structurally normal mitral valve.  Mild (Grade I) mitral regurgitation. Compared to the study done on 10/08/2018, no significant change. Aortic root dilatation not noted in the present study.    EKG   EKG 04/06/2020: Sinus rhythm with first-degree block at rate of 64 bpm, left atrial enlargement, normal axis.  IVCD, borderline  criteria for LVH.  Normal QT interval, no evidence of ischemia.  No significant change from 03/25/2019.  Assessment     ICD-10-CM   1. Essential hypertension  I10 atenolol (TENORMIN) 50 MG tablet  2. Atherosclerosis of native coronary artery of native heart without angina pectoris  I25.10   3. S/P TAVR (transcatheter aortic valve replacement)  Z95.2 PCV ECHOCARDIOGRAM COMPLETE  4. Paravalvular leak (prosthetic valve), sequela  T82.03XS PCV ECHOCARDIOGRAM COMPLETE  5. Stage 3b chronic kidney disease (HCC)  N18.32      Recommendations:   Marvin Houston  is a 86 y.o.  aortic stenosis that is S/P TAVR on 09/09/2016 and mild CAD, moderate paravalvular regurgitation, aortic root dilation at 4.9 cm.   Patient remains stable, however blood pressure is still consistently above goal of less than 427 systolic.  He is currently taking Benicar as well as amlodipine 5 mg.  Previously he took amlodipine 10 mg but experienced leg edema.  We will continue Benicar and amlodipine, as well as add atenolol 50 mg once daily.  In view of patient's stage IIIb chronic kidney disease and first-degree AV block by EKG feel 50 mg atenolol once daily is most appropriate to improve blood pressure control.  Will not increase amlodipine to 10 mg as he experienced bilateral lower leg edema at this higher dose.  Encourage patient to continue to monitor blood pressures regularly.  Reviewed and discussed with patient results of echocardiogram from 04/20/2020.  Compared to previous study 05/13/2019, aortic root dilation was not noted however aortic regurgitation remained stable and echocardiogram is otherwise unchanged.  We will repeat in 1 year to reevaluate aortic root dilation.  Dyspnea on exertion is chronic and stable, likely due to obesity and age-related deconditioning.  Encourage patient to focus on lifestyle modifications for weight loss.  Follow-up in 6 weeks for reevaluation of hypertension.  Patient was seen in collaboration  with Dr. Einar Gip. He also reviewed patient's chart and examined the patient. Dr. Einar Gip is in agreement of the plan.    Alethia Berthold, PA-C 05/25/2020, 3:42 PM Office: [phone]

## 2020-06-01 NOTE — Telephone Encounter (Signed)
From pt

## 2020-06-29 ENCOUNTER — Other Ambulatory Visit: Payer: Self-pay | Admitting: Cardiology

## 2020-06-29 DIAGNOSIS — I10 Essential (primary) hypertension: Secondary | ICD-10-CM

## 2020-07-12 NOTE — Progress Notes (Signed)
Primary Physician/Referring:  Jani Gravel, MD  Patient ID: Marvin Houston, male    DOB: 1934/06/22, 86 y.o.   MRN: [id number]  No chief complaint on file.  HPI:    [name]  is a 86 y.o. aortic stenosis that is S/P TAVR on 09/09/2016 and mild CAD, moderate paravalvular regurgitation, aortic root dilation at 4.9 cm.   Patient presents for 6-week follow-up of hypertension.  At last visit added atenolol 50 mg once daily.  Patient reports after starting atenolol he had 1 episode of dizziness when he felt like eating may pass out.  Patient cut atenolol in half and took 25 mg once daily for 1 week, he tolerated this well.  Patient then increase to 50 mg of atenolol daily and has been tolerating this well since.  No recurrence of dizziness or near syncope.  Patient states he has been more active, walking regularly.  He also has been focused on healthy diet modifications in order to lose weight.  However he does still report high salt intake.  He brings with him home blood pressure log with readings 132-138/60s.  Blood pressure is trending towards goal.  He denies chest pain, shortness of breath, palpitations, PND, orthopnea, syncope.  He does continue to have mild bilateral lower leg edema.   Past Medical History:  Diagnosis Date   Anemia    Aortic stenosis    Arthritis    Diabetes mellitus without complication ([place])    Dyspnea    GI bleed    not yet determined cause   History of recent blood transfusion 09/02/2016   Hypertension    Stenosis of aorta    Past Surgical History:  Procedure Laterality Date   CARDIAC CATHETERIZATION     HERNIA REPAIR     TEE WITHOUT CARDIOVERSION N/A 07/29/2016   Procedure: TRANSESOPHAGEAL ECHOCARDIOGRAM (TEE);  Surgeon: Adrian Prows, MD;  Location: [facility];  Service: Cardiovascular;  Laterality: N/A;   TEE WITHOUT CARDIOVERSION N/A 09/09/2016   Procedure: TRANSESOPHAGEAL ECHOCARDIOGRAM (TEE);  Surgeon: Sherren Mocha, MD;  Location: [facility];   Service: Open Heart Surgery;  Laterality: N/A;   TRANSCATHETER AORTIC VALVE REPLACEMENT, TRANSFEMORAL Bilateral 09/09/2016   Procedure: TRANSCATHETER AORTIC VALVE REPLACEMENT, TRANSFEMORAL;  Surgeon: Sherren Mocha, MD;  Location: [facility];  Service: Open Heart Surgery;  Laterality: Bilateral;   Social History   Tobacco Use   Smoking status: Former Smoker    Packs/day: 1.00    Years: 10.00    Pack years: 10.00    Types: Cigarettes    Quit date: 1986    Years since quitting: 35.9   Smokeless tobacco: Never Used  Substance Use Topics   Alcohol use: Yes    Comment: wine nightly   Marital Status: Married   ROS  Review of Systems  Constitutional: Negative for malaise/fatigue and weight gain.  Cardiovascular: Positive for dyspnea on exertion (chronic, stable ). Negative for chest pain, claudication, leg swelling, near-syncope, orthopnea, palpitations, paroxysmal nocturnal dyspnea and syncope.  Respiratory: Negative for shortness of breath.   Hematologic/Lymphatic: Does not bruise/bleed easily.  Gastrointestinal: Negative for melena.  Neurological: Negative for dizziness and weakness.   Objective  There were no vitals taken for this visit. There is no height or weight on file to calculate BMI.   Physical Exam Vitals reviewed.  Constitutional:      Appearance: He is well-developed.     Comments: Well built, mildly obese  HENT:     Head: Normocephalic.  Cardiovascular:  Rate and Rhythm: Regular rhythm. Bradycardia present.     Pulses: Intact distal pulses.          Femoral pulses are 2+ on the right side and 2+ on the left side.      Popliteal pulses are 2+ on the right side and 1+ on the left side.       Dorsalis pedis pulses are 2+ on the right side and 1+ on the left side.       Posterior tibial pulses are 2+ on the right side and 1+ on the left side.     Heart sounds: Murmur heard.  Crescendo early systolic murmur is present with a grade of 2/6 at the apex radiating to  the neck.  Blowing decrescendo early diastolic murmur is present at the lower left sternal border.      Comments: 1+ pitting edema bilateral lower legs.  Pulmonary:     Effort: Pulmonary effort is normal. No accessory muscle usage or respiratory distress.     Breath sounds: Normal breath sounds.  Neurological:     Mental Status: He is alert and oriented to person, place, and time.     Laboratory examination:   External labs: 02/17/2020: Hb 11.1/HCT 32.7, platelets 129.  Normal indicis. Serum glucose 158, BUN 20, creatinine 1.47, EGFR 43 mL.  Potassium 4.5.  CMP otherwise normal. A1c 5.8%. Cholesterol, total 114.000 02/17/2020 HDL 34.000 02/17/2020 LDL-C 57.000 02/17/2020 Triglycerides 125.000 02/17/2020  Labs 03/10/2018: RBC 3.17, hemoglobin 10.1, hematocrit 30, CBC otherwise normal. Creatinine 1.43, potassium 4.8, EGFR 46, CMP normal. Ferritin normal. Hemoglobin A1c 5.6%. Cholesterol 101, triglycerides 94, HDL 33, LDL 49. Iron studies normal.  Medications   Outpatient Medications Prior to Visit  Medication Sig Dispense Refill   amLODipine (NORVASC) 5 MG tablet TAKE 1 TABLET(5 MG) BY MOUTH DAILY 30 tablet 2   amoxicillin (AMOXIL) 500 MG tablet Take 4 tablets by mouth one hour prior to dental appointment (Patient not taking: Reported on 05/25/2020) 8 tablet 0   aspirin EC 81 MG EC tablet Take 1 tablet (81 mg total) by mouth daily.     atenolol (TENORMIN) 50 MG tablet Take 1 tablet (50 mg total) by mouth daily. 30 tablet 2   Cyanocobalamin (VITAMIN B-12 PO) Take 1 tablet by mouth every evening.      ferrous sulfate 325 (65 FE) MG tablet Take 1 tablet (325 mg total) by mouth 2 (two) times daily with a meal. 60 tablet 12   lovastatin (MEVACOR) 40 MG tablet Take 40 mg by mouth 2 (two) times a week. Mon and Tues     metFORMIN (GLUCOPHAGE) 500 MG tablet Take 500 mg by mouth 2 (two) times daily with a meal.      Multiple Vitamin (MULTIVITAMIN WITH MINERALS) TABS tablet Take 1 tablet by  mouth every evening.      Multiple Vitamins-Minerals (VISION VITAMINS PO) Take 1 tablet by mouth every evening.      olmesartan-hydrochlorothiazide (BENICAR HCT) 20-12.5 MG tablet Take 1 tablet by mouth daily.     psyllium (METAMUCIL) 58.6 % powder Take 1 packet by mouth daily.     No facility-administered medications prior to visit.   No orders of the defined types were placed in this encounter.  There are no discontinued medications.     Radiology:  No results found.  Cardiac Studies:   Coronary angiogram 08/12/2016: 60% to 70% smooth mid RCA stenosis, no change from 2007. Severe calcification of the AV annulus and AV leaflets.  Severe mitral annular calcification.  Echocardiogram 05/13/2019:  Normal LV systolic function with EF 56%. Mild concentric hypertrophy of  the left ventricle. Left ventricle cavity is normal in size. Normal global  wall motion. Doppler evidence of grade I (impaired) diastolic dysfunction,  normal LAP.  Left atrial cavity is mildly dilated.  Bioprosthetic aortic valve. Mild aortic valve stenosis. Aortic valve mean  gradient of 12 mmHg, Vmax of 2.4 m/s. Calculated aortic valve area by  continuity equation is 1.2 cm. Moderate (Grade II) paravalvular aortic  regurgitation.  Mild to moderate mitral regurgitation.  Mild pulmonic regurgitation.  IVC is dilated with a respiratory response of >50%. Estimated RA pressure  8 mmHg.  No significant change compared to previous study on 10/08/2018.   PCV ECHOCARDIOGRAM COMPLETE 69/48/5462 Normal LV systolic function with visual EF 55-60%. Left ventricle cavity is normal in size. Normal left ventricular wall thickness. Normal global wall motion. Normal diastolic filling pattern. Bioprosthetic trileaflet aortic valve.  Mild to moderate perivalvular aortic regurgitation. Mildly restricted aortic valve leaflets. Trace aortic stenosis. Aortic valve peak pressure gradient of 28.2 and mean gradient of 14.6 mmHg, calculated  aortic valve area 1.15 cm. Structurally normal mitral valve.  Mild (Grade I) mitral regurgitation. Compared to the study done on 10/08/2018, no significant change. Aortic root dilatation not noted in the present study.    EKG   EKG 04/06/2020: Sinus rhythm with first-degree block at rate of 64 bpm, left atrial enlargement, normal axis.  IVCD, borderline criteria for LVH.  Normal QT interval, no evidence of ischemia.  No significant change from 03/25/2019.  Assessment     ICD-10-CM   1. Essential hypertension  I10      Recommendations:   Marvin Houston  is a 86 y.o.  aortic stenosis that is S/P TAVR on 09/09/2016 and mild CAD, moderate paravalvular regurgitation, aortic root dilation at 4.9 cm.   Patient presents for 6-week follow-up of hypertension.  After starting atenolol he experienced dizziness and had to reduce the dose to 25 mg daily but has since increased back to 50 mg and is tolerating this well.  Blood pressure is elevated initially in the office, however upon recheck it has improved.  Patient brings with him daily log of blood pressure readings.  Blood pressure readings have continued to trend towards goal with systolic pressures less than 140.  As patient's blood pressure is well controlled today in the office and home readings have significantly improved, will not make changes to his antihypertensive medications today.  However discussed with patient that if blood pressures increase to >140/90 he will need to notify the office.  If patient needs additional antihypertensive medication can consider increasing HCTZ from 12.5 to 25 mg daily as this will likely improve both blood pressure control and lower leg edema.  Suspect elevated blood pressure as well as lower leg edema will improve if patient reduce his salt intake.  Discussed at length with patient importance of low-salt diet and avoiding salty snacks, he verbalized understanding and agreement.  Patient will also continue to focus on  weight loss as this will likely improve blood pressure control.   In regard to aortic root dilation, will repeat echocardiogram in 1 year to reevaluate.  Follow-up in 6 months for hypertension, CAD, aortic root dilation, s/p TAVR.   Marvin Berthold, PA-C 07/13/2020, 1:46 PM Office: [phone]

## 2020-07-13 ENCOUNTER — Encounter: Payer: Self-pay | Admitting: Student

## 2020-07-13 ENCOUNTER — Other Ambulatory Visit: Payer: Self-pay

## 2020-07-13 ENCOUNTER — Ambulatory Visit: Payer: Medicare Other | Admitting: Student

## 2020-07-13 VITALS — BP 132/60 | HR 51 | Resp 16 | Ht 70.0 in | Wt 216.0 lb

## 2020-07-13 DIAGNOSIS — I10 Essential (primary) hypertension: Secondary | ICD-10-CM

## 2020-08-19 ENCOUNTER — Other Ambulatory Visit: Payer: Self-pay | Admitting: Student

## 2020-08-19 DIAGNOSIS — I10 Essential (primary) hypertension: Secondary | ICD-10-CM

## 2020-09-07 DIAGNOSIS — E7800 Pure hypercholesterolemia, unspecified: Secondary | ICD-10-CM | POA: Diagnosis not present

## 2020-09-07 DIAGNOSIS — I10 Essential (primary) hypertension: Secondary | ICD-10-CM | POA: Diagnosis not present

## 2020-09-21 ENCOUNTER — Other Ambulatory Visit: Payer: Self-pay | Admitting: Student

## 2020-09-21 DIAGNOSIS — I10 Essential (primary) hypertension: Secondary | ICD-10-CM

## 2020-09-28 DIAGNOSIS — D1801 Hemangioma of skin and subcutaneous tissue: Secondary | ICD-10-CM | POA: Diagnosis not present

## 2020-09-28 DIAGNOSIS — L814 Other melanin hyperpigmentation: Secondary | ICD-10-CM | POA: Diagnosis not present

## 2020-09-28 DIAGNOSIS — D225 Melanocytic nevi of trunk: Secondary | ICD-10-CM | POA: Diagnosis not present

## 2020-09-28 DIAGNOSIS — L820 Inflamed seborrheic keratosis: Secondary | ICD-10-CM | POA: Diagnosis not present

## 2020-09-28 DIAGNOSIS — L821 Other seborrheic keratosis: Secondary | ICD-10-CM | POA: Diagnosis not present

## 2020-09-28 DIAGNOSIS — L570 Actinic keratosis: Secondary | ICD-10-CM | POA: Diagnosis not present

## 2020-10-03 DIAGNOSIS — E119 Type 2 diabetes mellitus without complications: Secondary | ICD-10-CM | POA: Diagnosis not present

## 2020-10-03 DIAGNOSIS — D649 Anemia, unspecified: Secondary | ICD-10-CM | POA: Diagnosis not present

## 2020-10-03 DIAGNOSIS — E538 Deficiency of other specified B group vitamins: Secondary | ICD-10-CM | POA: Diagnosis not present

## 2020-10-03 DIAGNOSIS — Z0000 Encounter for general adult medical examination without abnormal findings: Secondary | ICD-10-CM | POA: Diagnosis not present

## 2020-10-03 DIAGNOSIS — R739 Hyperglycemia, unspecified: Secondary | ICD-10-CM | POA: Diagnosis not present

## 2020-10-03 DIAGNOSIS — I10 Essential (primary) hypertension: Secondary | ICD-10-CM | POA: Diagnosis not present

## 2020-10-03 DIAGNOSIS — E785 Hyperlipidemia, unspecified: Secondary | ICD-10-CM | POA: Diagnosis not present

## 2020-10-10 DIAGNOSIS — E7800 Pure hypercholesterolemia, unspecified: Secondary | ICD-10-CM | POA: Diagnosis not present

## 2020-10-10 DIAGNOSIS — E119 Type 2 diabetes mellitus without complications: Secondary | ICD-10-CM | POA: Diagnosis not present

## 2020-10-10 DIAGNOSIS — D509 Iron deficiency anemia, unspecified: Secondary | ICD-10-CM | POA: Diagnosis not present

## 2020-10-10 DIAGNOSIS — N1832 Chronic kidney disease, stage 3b: Secondary | ICD-10-CM | POA: Diagnosis not present

## 2020-10-10 DIAGNOSIS — I10 Essential (primary) hypertension: Secondary | ICD-10-CM | POA: Diagnosis not present

## 2020-10-10 DIAGNOSIS — Z0000 Encounter for general adult medical examination without abnormal findings: Secondary | ICD-10-CM | POA: Diagnosis not present

## 2020-12-19 ENCOUNTER — Other Ambulatory Visit: Payer: Self-pay | Admitting: Student

## 2020-12-19 DIAGNOSIS — I10 Essential (primary) hypertension: Secondary | ICD-10-CM

## 2021-01-04 ENCOUNTER — Telehealth: Payer: Self-pay

## 2021-01-04 DIAGNOSIS — I10 Essential (primary) hypertension: Secondary | ICD-10-CM | POA: Diagnosis not present

## 2021-01-04 DIAGNOSIS — D509 Iron deficiency anemia, unspecified: Secondary | ICD-10-CM | POA: Diagnosis not present

## 2021-01-04 DIAGNOSIS — Z0000 Encounter for general adult medical examination without abnormal findings: Secondary | ICD-10-CM | POA: Diagnosis not present

## 2021-01-04 DIAGNOSIS — E119 Type 2 diabetes mellitus without complications: Secondary | ICD-10-CM | POA: Diagnosis not present

## 2021-01-04 DIAGNOSIS — R600 Localized edema: Secondary | ICD-10-CM

## 2021-01-04 MED ORDER — ISOSORBIDE DINITRATE 30 MG PO TABS: 30.0000 mg | ORAL_TABLET | Freq: Two times a day (BID) | ORAL | 2 refills | Status: DC

## 2021-01-04 MED ORDER — HYDRALAZINE HCL 25 MG PO TABS: 25.0000 mg | ORAL_TABLET | Freq: Two times a day (BID) | ORAL | 2 refills | Status: DC

## 2021-01-04 NOTE — Addendum Note (Signed)
Addended by: Kela Millin on: 01/04/2021 04:35 PM   Modules accepted: Orders

## 2021-01-04 NOTE — Telephone Encounter (Signed)
  Patient called [place] stating that since addition of amlodipine (new drug) he has developed significant leg edema and blood pressure has not been well controlled.  He is presently on an ARB with HCTZ.  He also has stage III chronic kidney disease.  I will discontinue amlodipine and add hydralazine 25 mg twice daily (can also cause leg edema) along with isosorbide dinitrate 30 mg twice daily and follow-up on hypertension.    ICD-10-CM   1. Essential hypertension  I10 isosorbide dinitrate (ISORDIL) 30 MG tablet    hydrALAZINE (APRESOLINE) 25 MG tablet  2. Leg edema  R60.0    Meds ordered this encounter  Medications  . isosorbide dinitrate (ISORDIL) 30 MG tablet    Sig: Take 1 tablet (30 mg total) by mouth 2 (two) times daily.    Dispense:  60 tablet    Refill:  2  . hydrALAZINE (APRESOLINE) 25 MG tablet    Sig: Take 1 tablet (25 mg total) by mouth in the morning and at bedtime.    Dispense:  60 tablet    Refill:  2    Medications Discontinued During This Encounter  Medication Reason  . amLODipine (NORVASC) 5 MG tablet Side effect (s)      Adrian Prows, MD, [facility] 01/04/2021, 4:34 PM Office: [phone] Fax: [phone] Pager: [phone]

## 2021-01-04 NOTE — Telephone Encounter (Signed)
Pt called regarding bp and swelling. Pt takes all of his medications regularly. He was started on additional bp medication last visit but I am not seeing that in the notes and he is unsure of which medication it is. At his doctors office this morning and his bp was around 170/60. It has been around 159 systolic lately and has been going up and down. Both legs and ankles are swelling.

## 2021-01-09 DIAGNOSIS — K5520 Angiodysplasia of colon without hemorrhage: Secondary | ICD-10-CM | POA: Diagnosis not present

## 2021-01-09 DIAGNOSIS — E119 Type 2 diabetes mellitus without complications: Secondary | ICD-10-CM | POA: Diagnosis not present

## 2021-01-09 DIAGNOSIS — I10 Essential (primary) hypertension: Secondary | ICD-10-CM | POA: Diagnosis not present

## 2021-01-09 DIAGNOSIS — N1832 Chronic kidney disease, stage 3b: Secondary | ICD-10-CM | POA: Diagnosis not present

## 2021-01-09 DIAGNOSIS — D509 Iron deficiency anemia, unspecified: Secondary | ICD-10-CM | POA: Diagnosis not present

## 2021-01-10 NOTE — Progress Notes (Signed)
Primary Physician/Referring:  Jani Gravel, MD  Patient ID: Marvin Houston, male    DOB: 05-Aug-1934, 87 y.o.   MRN: [id number]  Chief Complaint  Patient presents with  . Hypertension  . Coronary Artery Disease  . aortic root dilation  . Follow-up    6 months   HPI:    [name]  is a 87 y.o. aortic stenosis that is S/P TAVR on 09/09/2016 and mild CAD, moderate paravalvular regurgitation, aortic root dilation at 4.9 cm.   Patient presents for follow-up of hypertension.  Recently called the office on 01/04/2021 to report blood pressure readings at home-uncontrolled and he has developed bilateral lower leg edema.  Patient was started on amlodipine at last visit.  Given lower leg edema patient was advised to discontinue amlodipine and add hydralazine 25 mg twice daily along with isosorbide dinitrate 30 mg twice daily. Patient home blood pressure readings remain elevated. His home heart rate readings are low averaging 40-45 bpm per home monitor. Patient's leg swelling has reportedly improved since discontinuation of amlodipine. He has been experiencing fatigue over the last several weeks as well.   Denies chest pain, palpitations, syncope, near syncope, dizziness.  Denies orthopnea, PND.   Past Medical History:  Diagnosis Date  . Anemia   . Aortic stenosis   . Arthritis   . Diabetes mellitus without complication ([place])   . Dyspnea   . GI bleed    not yet determined cause  . History of recent blood transfusion 09/02/2016  . Hypertension   . Stenosis of aorta    Past Surgical History:  Procedure Laterality Date  . CARDIAC CATHETERIZATION    . HERNIA REPAIR    . TEE WITHOUT CARDIOVERSION N/A 07/29/2016   Procedure: TRANSESOPHAGEAL ECHOCARDIOGRAM (TEE);  Surgeon: Adrian Prows, MD;  Location: [place];  Service: Cardiovascular;  Laterality: N/A;  . TEE WITHOUT CARDIOVERSION N/A 09/09/2016   Procedure: TRANSESOPHAGEAL ECHOCARDIOGRAM (TEE);  Surgeon: Sherren Mocha, MD;  Location: [place];  Service: Open Heart Surgery;  Laterality: N/A;  . TRANSCATHETER AORTIC VALVE REPLACEMENT, TRANSFEMORAL Bilateral 09/09/2016   Procedure: TRANSCATHETER AORTIC VALVE REPLACEMENT, TRANSFEMORAL;  Surgeon: Sherren Mocha, MD;  Location: [place];  Service: Open Heart Surgery;  Laterality: Bilateral;  History reviewed. No pertinent family history.  Social History   Tobacco Use  . Smoking status: Former Smoker    Packs/day: 1.00    Years: 10.00    Pack years: 10.00    Types: Cigarettes    Quit date: 1986    Years since quitting: 36.4  . Smokeless tobacco: Never Used  Substance Use Topics  . Alcohol use: Yes    Comment: wine nightly   Marital Status: Married   ROS  Review of Systems  Constitutional: Positive for malaise/fatigue. Negative for weight gain.  Cardiovascular: Positive for dyspnea on exertion (chronic, stable ) and leg swelling (improving). Negative for chest pain, claudication, near-syncope, orthopnea, palpitations, paroxysmal nocturnal dyspnea and syncope.  Respiratory: Negative for shortness of breath.   Hematologic/Lymphatic: Does not bruise/bleed easily.  Gastrointestinal: Negative for melena.  Neurological: Negative for dizziness and weakness.   Objective  Blood pressure (!) 144/55, pulse (!) 45, temperature 97.9 F (36.6 C), temperature source Temporal, resp. rate 16, height _0  (1.778 m), weight 218 lb 9.6 oz (99.2 kg), SpO2 100 %. Body mass index is 31.37 kg/m.   Physical Exam Vitals reviewed.  Constitutional:      Appearance: He is well-developed.     Comments: Well  built, mildly obese  Cardiovascular:     Rate and Rhythm: Regular rhythm. Bradycardia present.     Pulses: Intact distal pulses.          Femoral pulses are 2+ on the right side and 2+ on the left side.      Popliteal pulses are 2+ on the right side and 1+ on the left side.       Dorsalis pedis pulses are 2+ on the right side and 1+ on the left side.       Posterior tibial pulses are 2+ on  the right side and 1+ on the left side.     Heart sounds: Murmur heard.   Crescendo early systolic murmur is present with a grade of 2/6 at the apex radiating to the neck.  Blowing decrescendo early diastolic murmur is present at the lower left sternal border.     Comments: 1+ pitting edema bilateral lower legs.  Pulmonary:     Effort: Pulmonary effort is normal. No accessory muscle usage or respiratory distress.     Breath sounds: Normal breath sounds. No wheezing.  Neurological:     Mental Status: He is alert and oriented to person, place, and time.     Laboratory examination:   CMP Latest Ref Rng & Units 09/11/2016 09/10/2016 09/09/2016  Glucose 65 - 99 mg/dL 124(H) 164(H) 146(H)  BUN 6 - 20 mg/dL 11 11 -  Creatinine 0.61 - 1.24 mg/dL 1.20 1.11 -  Sodium 135 - 145 mmol/L 142 137 143  Potassium 3.5 - 5.1 mmol/L 4.4 3.5 4.1  Chloride 101 - 111 mmol/L 111 106 -  CO2 22 - 32 mmol/L 26 24 -  Calcium 8.9 - 10.3 mg/dL 8.9 8.3(L) -  Total Protein 6.5 - 8.1 g/dL - - -  Total Bilirubin 0.3 - 1.2 mg/dL - - -  Alkaline Phos 38 - 126 U/L - - -  AST 15 - 41 U/L - - -  ALT 17 - 63 U/L - - -   CBC Latest Ref Rng & Units 09/11/2016 09/10/2016 09/09/2016  WBC 4.0 - 10.5 K/uL 5.6 6.7 5.3  Hemoglobin 13.0 - 17.0 g/dL 9.3(L) 8.8(L) 9.1(L)  Hematocrit 39.0 - 52.0 % 29.1(L) 27.9(L) 27.6(L)  Platelets 150 - 400 K/uL 102(L) 114(L) 106(L)   Lipid Panel  No results found for: CHOL, TRIG, HDL, CHOLHDL, VLDL, LDLCALC, LDLDIRECT HEMOGLOBIN A1C Lab Results  Component Value Date   HGBA1C 4.8 09/08/2016   MPG 91 09/08/2016   TSH No results for input(s): TSH in the last 8760 hours.   External labs: 05/07/2021: Alk phos 60, ALT 15, AST 11, bilirubin 1.7- high BUN 24, creatinine 1.61, GFR 45, glucose 150, potassium 5.2, sodium 142 A1c 5.4% Hemoglobin 9.7, hematocrit 28.4, MCV 96.9, platelets 126  10/03/2020: Hemoglobin 10.1, hematocrit 30.0, platelet 126 Glucose 151, BUN 24, creatinine 1.53, GFR 41,  sodium 140, potassium 4.5 A1c 5.4% Total cholesterol 100 triglycerides 100, HDL 32, LDL 49 TSH 2.72  02/17/2020: Hb 11.1/HCT 32.7, platelets 129.  Normal indicis. Serum glucose 158, BUN 20, creatinine 1.47, EGFR 43 mL.  Potassium 4.5.  CMP otherwise normal. A1c 5.8%.  Labs 03/10/2018: RBC 3.17, hemoglobin 10.1, hematocrit 30, CBC otherwise normal. Creatinine 1.43, potassium 4.8, EGFR 46, CMP normal. Ferritin normal. Hemoglobin A1c 5.6%. Cholesterol 101, triglycerides 94, HDL 33, LDL 49. Iron studies normal.  Allergies  No Known Allergies    Medications Prior to Visit:   Outpatient Medications Prior to Visit  Medication Sig Dispense  Refill  . aspirin EC 81 MG EC tablet Take 1 tablet (81 mg total) by mouth daily.    . Cyanocobalamin (VITAMIN B-12 PO) Take 1 tablet by mouth every evening.     . ferrous sulfate 325 (65 FE) MG tablet Take 1 tablet (325 mg total) by mouth 2 (two) times daily with a meal. 60 tablet 12  . hydrALAZINE (APRESOLINE) 25 MG tablet Take 1 tablet (25 mg total) by mouth in the morning and at bedtime. 60 tablet 2  . isosorbide dinitrate (ISORDIL) 30 MG tablet Take 1 tablet (30 mg total) by mouth 2 (two) times daily. 60 tablet 2  . lovastatin (MEVACOR) 40 MG tablet Take 40 mg by mouth 2 (two) times a week. Mon and Tues    . Multiple Vitamin (MULTIVITAMIN WITH MINERALS) TABS tablet Take 1 tablet by mouth every evening.     . Multiple Vitamins-Minerals (VISION VITAMINS PO) Take 1 tablet by mouth every evening.     . olmesartan-hydrochlorothiazide (BENICAR HCT) 20-12.5 MG tablet Take 1 tablet by mouth daily.    Marland Kitchen omega-3 fish oil (MAXEPA) 1000 MG CAPS capsule Take 1 capsule by mouth daily.    . psyllium (METAMUCIL) 58.6 % powder Take 1 packet by mouth daily.    Marland Kitchen atenolol (TENORMIN) 50 MG tablet TAKE 1 TABLET(50 MG) BY MOUTH DAILY 30 tablet 6  . amoxicillin (AMOXIL) 500 MG tablet Take 4 tablets by mouth one hour prior to dental appointment (Patient not taking: Reported on  01/11/2021) 8 tablet 0  . metFORMIN (GLUCOPHAGE) 500 MG tablet Take 500 mg by mouth 2 (two) times daily with a meal.      No facility-administered medications prior to visit.     Final Medications at End of Visit    Current Meds  Medication Sig  . aspirin EC 81 MG EC tablet Take 1 tablet (81 mg total) by mouth daily.  . Cyanocobalamin (VITAMIN B-12 PO) Take 1 tablet by mouth every evening.   . ferrous sulfate 325 (65 FE) MG tablet Take 1 tablet (325 mg total) by mouth 2 (two) times daily with a meal.  . hydrALAZINE (APRESOLINE) 25 MG tablet Take 1 tablet (25 mg total) by mouth in the morning and at bedtime.  . isosorbide dinitrate (ISORDIL) 30 MG tablet Take 1 tablet (30 mg total) by mouth 2 (two) times daily.  Marland Kitchen lovastatin (MEVACOR) 40 MG tablet Take 40 mg by mouth 2 (two) times a week. Mon and Tues  . Multiple Vitamin (MULTIVITAMIN WITH MINERALS) TABS tablet Take 1 tablet by mouth every evening.   . Multiple Vitamins-Minerals (VISION VITAMINS PO) Take 1 tablet by mouth every evening.   . olmesartan-hydrochlorothiazide (BENICAR HCT) 20-12.5 MG tablet Take 1 tablet by mouth daily.  Marland Kitchen omega-3 fish oil (MAXEPA) 1000 MG CAPS capsule Take 1 capsule by mouth daily.  . psyllium (METAMUCIL) 58.6 % powder Take 1 packet by mouth daily.  . [DISCONTINUED] atenolol (TENORMIN) 50 MG tablet TAKE 1 TABLET(50 MG) BY MOUTH DAILY    Radiology:  No results found.  Cardiac Studies:   Coronary angiogram 08/12/2016: 60% to 70% smooth mid RCA stenosis, no change from 2007. Severe calcification of the AV annulus and AV leaflets. Severe mitral annular calcification.  Echocardiogram 05/13/2019:  Normal LV systolic function with EF 56%. Mild concentric hypertrophy of  the left ventricle. Left ventricle cavity is normal in size. Normal global  wall motion. Doppler evidence of grade I (impaired) diastolic dysfunction,  normal LAP.  Left  atrial cavity is mildly dilated.  Bioprosthetic aortic valve. Mild aortic  valve stenosis. Aortic valve mean  gradient of 12 mmHg, Vmax of 2.4 m/s. Calculated aortic valve area by  continuity equation is 1.2 cm. Moderate (Grade II) paravalvular aortic  regurgitation.  Mild to moderate mitral regurgitation.  Mild pulmonic regurgitation.  IVC is dilated with a respiratory response of >50%. Estimated RA pressure  8 mmHg.  No significant change compared to previous study on 10/08/2018.   [facility] ECHOCARDIOGRAM COMPLETE 07/37/1062 Normal LV systolic function with visual EF 55-60%. Left ventricle cavity is normal in size. Normal left ventricular wall thickness. Normal global wall motion. Normal diastolic filling pattern. Bioprosthetic trileaflet aortic valve.  Mild to moderate perivalvular aortic regurgitation. Mildly restricted aortic valve leaflets. Trace aortic stenosis. Aortic valve peak pressure gradient of 28.2 and mean gradient of 14.6 mmHg, calculated aortic valve area 1.15 cm. Structurally normal mitral valve.  Mild (Grade I) mitral regurgitation. Compared to the study done on 10/08/2018, no significant change. Aortic root dilatation not noted in the present study.    EKG   01/11/2021: Marked sinus bradycardia with first-degree AV block at a rate of 45 bpm.  IVCD.  Borderline criteria for LVH.  EKG 04/06/2020: Sinus rhythm with first-degree block at rate of 64 bpm, left atrial enlargement, normal axis.  IVCD, borderline criteria for LVH.  Normal QT interval, no evidence of ischemia.  No significant change from 03/25/2019.  Assessment     ICD-10-CM   1. Sinus bradycardia by electrocardiogram  R00.1   2. Essential hypertension  I10 EKG 12-Lead  3. Leg edema  R60.0   4. Aortic root dilatation (H[facility])  I77.810     No orders of the defined types were placed in this encounter.  Medications Discontinued During This Encounter  Medication Reason  . metFORMIN (GLUCOPHAGE) 500 MG tablet Error  . amoxicillin (AMOXIL) 500 MG tablet Patient has not taken in last 30 days  .  atenolol (TENORMIN) 50 MG tablet Side effect (s)    Recommendations:   Marvin Houston  is a 87 y.o.  aortic stenosis that is S/P TAVR on 09/09/2016 and mild CAD, moderate paravalvular regurgitation, aortic root dilation at 4.9 cm.  Patient presents for follow-up of hypertension.  Recently called the office on 01/04/2021 to report blood pressure readings at home-uncontrolled and he has developed bilateral lower leg edema.  Patient was started on amlodipine at last visit.  Given lower leg edema patient was advised to discontinue amlodipine and add hydralazine 25 mg twice daily along with isosorbide dinitrate 30 mg twice daily. Patient blood pressure in the office today remains mildly elevated.  However EKG reveals marked sinus bradycardia.  We will therefore discontinue atenolol.  Will not make additional changes to his medications at this time.  Advised patient to continue to monitor blood pressure and heart rate at home and bring a log with him to his next visit.  We will follow up closely with repeat EKG in 1 week.  Patient is relatively asymptomatic with the exception of fatigue, therefore do not feel hospital admission is indicated at this time.  Counseled patient regarding signs and symptoms that would warrant urgent or emergent evaluation, he verbalized understanding agreement.  We will also plan to repeat echocardiogram in 6 months to reevaluate aortic root dilation.  Follow-up in 1 week for bradycardia.   Alethia Berthold, PA-C 01/16/2021, 2:56 PM Office: [phone]

## 2021-01-11 ENCOUNTER — Ambulatory Visit: Payer: Medicare Other | Admitting: Student

## 2021-01-11 ENCOUNTER — Other Ambulatory Visit: Payer: Self-pay

## 2021-01-11 ENCOUNTER — Encounter: Payer: Self-pay | Admitting: Student

## 2021-01-11 VITALS — BP 144/55 | HR 45 | Temp 97.9°F | Resp 16 | Ht 70.0 in | Wt 218.6 lb

## 2021-01-11 DIAGNOSIS — I77810 Thoracic aortic ectasia: Secondary | ICD-10-CM

## 2021-01-11 DIAGNOSIS — I10 Essential (primary) hypertension: Secondary | ICD-10-CM | POA: Diagnosis not present

## 2021-01-11 DIAGNOSIS — R001 Bradycardia, unspecified: Secondary | ICD-10-CM

## 2021-01-11 DIAGNOSIS — R600 Localized edema: Secondary | ICD-10-CM | POA: Diagnosis not present

## 2021-01-14 DIAGNOSIS — D509 Iron deficiency anemia, unspecified: Secondary | ICD-10-CM | POA: Diagnosis not present

## 2021-01-17 NOTE — Progress Notes (Signed)
Primary Physician/Referring:  Jani Gravel, MD  Patient ID: Marvin Houston, male    DOB: 1933-08-31, 87 y.o.   MRN: [id number]  Chief Complaint  Patient presents with   Bradycardia   Follow-up   HPI:    [name]  is a 87 y.o. aortic stenosis that is S/P TAVR on 09/09/2016 and mild CAD, moderate paravalvular regurgitation, aortic root dilation at 4.9 cm.   Patient was seen in the office 1 week ago with concerns of uncontrolled blood pressure at home. EKG in the office revealed marked sinus bradycardia, therefore atenolol was discontinued. He now presents for follow up of hypertension and bradycardia with repeat EKG. Patient reports his fatigue has improved since discontinuation of atenolol and heart rate has improved to 55 bpm.  Patient's blood pressure is well controlled in the office today, however he brings with him a written log of home blood pressure readings which reveal his blood pressure remains elevated above goal.  Patient is presently taking isosorbide dinitrate and hydralazine twice daily.  Denies chest pain, palpitations, syncope, near syncope, dizziness.  Denies orthopnea, PND.   Past Medical History:  Diagnosis Date   Anemia    Aortic stenosis    Arthritis    Diabetes mellitus without complication ([place])    Dyspnea    GI bleed    not yet determined cause   History of recent blood transfusion 09/02/2016   Hypertension    Stenosis of aorta    Past Surgical History:  Procedure Laterality Date   CARDIAC CATHETERIZATION     HERNIA REPAIR     TEE WITHOUT CARDIOVERSION N/A 07/29/2016   Procedure: TRANSESOPHAGEAL ECHOCARDIOGRAM (TEE);  Surgeon: Adrian Prows, MD;  Location: [place];  Service: Cardiovascular;  Laterality: N/A;   TEE WITHOUT CARDIOVERSION N/A 09/09/2016   Procedure: TRANSESOPHAGEAL ECHOCARDIOGRAM (TEE);  Surgeon: Sherren Mocha, MD;  Location: [place];  Service: Open Heart Surgery;  Laterality: N/A;   TRANSCATHETER AORTIC VALVE REPLACEMENT, TRANSFEMORAL  Bilateral 09/09/2016   Procedure: TRANSCATHETER AORTIC VALVE REPLACEMENT, TRANSFEMORAL;  Surgeon: Sherren Mocha, MD;  Location: [place];  Service: Open Heart Surgery;  Laterality: Bilateral;  History reviewed. No pertinent family history.  Social History   Tobacco Use   Smoking status: Former    Packs/day: 1.00    Years: 10.00    Pack years: 10.00    Types: Cigarettes    Quit date: 1986    Years since quitting: 36.4   Smokeless tobacco: Never  Substance Use Topics   Alcohol use: Yes    Comment: wine nightly   Marital Status: Married   ROS  Review of Systems  Constitutional: Positive for malaise/fatigue (improved).  Cardiovascular:  Positive for dyspnea on exertion (chronic, stable ). Negative for chest pain, claudication, leg swelling (resolved), near-syncope, orthopnea, palpitations, paroxysmal nocturnal dyspnea and syncope.  Respiratory:  Negative for shortness of breath.   Neurological:  Negative for dizziness.  Objective  Blood pressure 126/68, pulse (!) 56, height '5\' 10"'  (1.778 m), weight 216 lb 9.6 oz (98.2 kg), SpO2 99 %. Body mass index is 31.08 kg/m.   Physical Exam Vitals reviewed.  Constitutional:      Appearance: He is well-developed.     Comments: Well built, mildly obese  Cardiovascular:     Rate and Rhythm: Regular rhythm. Bradycardia present.     Pulses: Intact distal pulses.          Femoral pulses are 2+ on the right side and 2+ on the left  side.      Popliteal pulses are 2+ on the right side and 1+ on the left side.       Dorsalis pedis pulses are 2+ on the right side and 1+ on the left side.       Posterior tibial pulses are 2+ on the right side and 1+ on the left side.     Heart sounds: Murmur heard.  Crescendo early systolic murmur is present with a grade of 2/6 at the apex radiating to the neck.  Blowing decrescendo early diastolic murmur is present at the lower left sternal border.  Pulmonary:     Effort: Pulmonary effort is normal. No accessory  muscle usage or respiratory distress.     Breath sounds: Normal breath sounds. No wheezing.  Musculoskeletal:     Right lower leg: Edema (trace) present.     Left lower leg: Edema (trace) present.  Neurological:     Mental Status: He is alert and oriented to person, place, and time.    Laboratory examination:   CMP Latest Ref Rng & Units 09/11/2016 09/10/2016 09/09/2016  Glucose 65 - 99 mg/dL 124(H) 164(H) 146(H)  BUN 6 - 20 mg/dL 11 11 -  Creatinine 0.61 - 1.24 mg/dL 1.20 1.11 -  Sodium 135 - 145 mmol/L 142 137 143  Potassium 3.5 - 5.1 mmol/L 4.4 3.5 4.1  Chloride 101 - 111 mmol/L 111 106 -  CO2 22 - 32 mmol/L 26 24 -  Calcium 8.9 - 10.3 mg/dL 8.9 8.3(L) -  Total Protein 6.5 - 8.1 g/dL - - -  Total Bilirubin 0.3 - 1.2 mg/dL - - -  Alkaline Phos 38 - 126 U/L - - -  AST 15 - 41 U/L - - -  ALT 17 - 63 U/L - - -   CBC Latest Ref Rng & Units 09/11/2016 09/10/2016 09/09/2016  WBC 4.0 - 10.5 K/uL 5.6 6.7 5.3  Hemoglobin 13.0 - 17.0 g/dL 9.3(L) 8.8(L) 9.1(L)  Hematocrit 39.0 - 52.0 % 29.1(L) 27.9(L) 27.6(L)  Platelets 150 - 400 K/uL 102(L) 114(L) 106(L)   Lipid Panel  No results found for: CHOL, TRIG, HDL, CHOLHDL, VLDL, LDLCALC, LDLDIRECT HEMOGLOBIN A1C Lab Results  Component Value Date   HGBA1C 4.8 09/08/2016   MPG 91 09/08/2016   TSH No results for input(s): TSH in the last 8760 hours.   External labs: 05/07/2021: Alk phos 60, ALT 15, AST 11, bilirubin 1.7- high BUN 24, creatinine 1.61, GFR 45, glucose 150, potassium 5.2, sodium 142 A1c 5.4% Hemoglobin 9.7, hematocrit 28.4, MCV 96.9, platelets 126  10/03/2020: Hemoglobin 10.1, hematocrit 30.0, platelet 126 Glucose 151, BUN 24, creatinine 1.53, GFR 41, sodium 140, potassium 4.5 A1c 5.4% Total cholesterol 100 triglycerides 100, HDL 32, LDL 49 TSH 2.72  02/17/2020: Hb 11.1/HCT 32.7, platelets 129.  Normal indicis. Serum glucose 158, BUN 20, creatinine 1.47, EGFR 43 mL.  Potassium 4.5.  CMP otherwise normal. A1c 5.8%.  Labs  03/10/2018: RBC 3.17, hemoglobin 10.1, hematocrit 30, CBC otherwise normal. Creatinine 1.43, potassium 4.8, EGFR 46, CMP normal. Ferritin normal. Hemoglobin A1c 5.6%. Cholesterol 101, triglycerides 94, HDL 33, LDL 49. Iron studies normal.  Allergies  No Known Allergies    Medications Prior to Visit:   Outpatient Medications Prior to Visit  Medication Sig Dispense Refill   aspirin EC 81 MG EC tablet Take 1 tablet (81 mg total) by mouth daily.     Cyanocobalamin (VITAMIN B-12 PO) Take 1 tablet by mouth every evening.  ferrous sulfate 325 (65 FE) MG tablet Take 1 tablet (325 mg total) by mouth 2 (two) times daily with a meal. 60 tablet 12   lovastatin (MEVACOR) 40 MG tablet Take 40 mg by mouth 2 (two) times a week. Mon and Tues     Multiple Vitamin (MULTIVITAMIN WITH MINERALS) TABS tablet Take 1 tablet by mouth every evening.      Multiple Vitamins-Minerals (VISION VITAMINS PO) Take 1 tablet by mouth every evening.      olmesartan-hydrochlorothiazide (BENICAR HCT) 20-12.5 MG tablet Take 1 tablet by mouth daily.     omega-3 fish oil (MAXEPA) 1000 MG CAPS capsule Take 1 capsule by mouth daily.     psyllium (METAMUCIL) 58.6 % powder Take 1 packet by mouth daily.     hydrALAZINE (APRESOLINE) 25 MG tablet Take 1 tablet (25 mg total) by mouth in the morning and at bedtime. 60 tablet 2   isosorbide dinitrate (ISORDIL) 30 MG tablet Take 1 tablet (30 mg total) by mouth 2 (two) times daily. 60 tablet 2   No facility-administered medications prior to visit.     Final Medications at End of Visit    Current Meds  Medication Sig   aspirin EC 81 MG EC tablet Take 1 tablet (81 mg total) by mouth daily.   Cyanocobalamin (VITAMIN B-12 PO) Take 1 tablet by mouth every evening.    ferrous sulfate 325 (65 FE) MG tablet Take 1 tablet (325 mg total) by mouth 2 (two) times daily with a meal.   lovastatin (MEVACOR) 40 MG tablet Take 40 mg by mouth 2 (two) times a week. Mon and Tues   Multiple Vitamin  (MULTIVITAMIN WITH MINERALS) TABS tablet Take 1 tablet by mouth every evening.    Multiple Vitamins-Minerals (VISION VITAMINS PO) Take 1 tablet by mouth every evening.    olmesartan-hydrochlorothiazide (BENICAR HCT) 20-12.5 MG tablet Take 1 tablet by mouth daily.   omega-3 fish oil (MAXEPA) 1000 MG CAPS capsule Take 1 capsule by mouth daily.   psyllium (METAMUCIL) 58.6 % powder Take 1 packet by mouth daily.   [DISCONTINUED] hydrALAZINE (APRESOLINE) 25 MG tablet Take 1 tablet (25 mg total) by mouth in the morning and at bedtime.   [DISCONTINUED] isosorbide dinitrate (ISORDIL) 30 MG tablet Take 1 tablet (30 mg total) by mouth 2 (two) times daily.    Radiology:  No results found.  Cardiac Studies:   Coronary angiogram 08/12/2016: 60% to 70% smooth mid RCA stenosis, no change from 2007. Severe calcification of the AV annulus and AV leaflets. Severe mitral annular calcification.  Echocardiogram 05/13/2019:  Normal LV systolic function with EF 56%. Mild concentric hypertrophy of  the left ventricle. Left ventricle cavity is normal in size. Normal global  wall motion. Doppler evidence of grade I (impaired) diastolic dysfunction,  normal LAP.  Left atrial cavity is mildly dilated.  Bioprosthetic aortic valve.  Mild aortic valve stenosis. Aortic valve mean  gradient of 12 mmHg, Vmax of 2.4  m/s. Calculated aortic valve area by  continuity equation is 1.2 cm. Moderate (Grade II) paravalvular aortic  regurgitation.  Mild to moderate mitral regurgitation.  Mild pulmonic regurgitation.  IVC is dilated with a respiratory response of >50%. Estimated RA pressure  8 mmHg.  No significant change compared to previous study on 10/08/2018.   [facility]V ECHOCARDIOGRAM COMPLETE 77/41/2878 Normal LV systolic function with visual EF 55-60%. Left ventricle cavity is normal in size. Normal left ventricular wall thickness. Normal global wall motion. Normal diastolic filling pattern. Bioprosthetic trileaflet  aortic valve.   Mild to moderate perivalvular aortic regurgitation. Mildly restricted aortic valve leaflets. Trace aortic stenosis. Aortic valve peak pressure gradient of 28.2 and mean gradient of 14.6 mmHg, calculated aortic valve area 1.15 cm. Structurally normal mitral valve.  Mild (Grade I) mitral regurgitation. Compared to the study done on 10/08/2018, no significant change. Aortic root dilatation not noted in the present study.    EKG   01/18/2021: Sinus bradycardia with first-degree AV block at a rate of 55 bpm.  Normal axis.  IVCD.   01/11/2021: Marked sinus bradycardia with first-degree AV block at a rate of 45 bpm.  IVCD.  Borderline criteria for LVH.  EKG 04/06/2020: Sinus rhythm with first-degree block at rate of 64 bpm, left atrial enlargement, normal axis.  IVCD, borderline criteria for LVH.  Normal QT interval, no evidence of ischemia.  No significant change from 03/25/2019.  Assessment     ICD-10-CM   1. Sinus bradycardia by electrocardiogram  R00.1 EKG 12-Lead    2. Essential hypertension  I10 hydrALAZINE (APRESOLINE) 25 MG tablet    isosorbide dinitrate (ISORDIL) 30 MG tablet      Meds ordered this encounter  Medications   hydrALAZINE (APRESOLINE) 25 MG tablet    Sig: Take 1 tablet (25 mg total) by mouth 3 (three) times daily.    Dispense:  90 tablet    Refill:  2   isosorbide dinitrate (ISORDIL) 30 MG tablet    Sig: Take 1 tablet (30 mg total) by mouth 3 (three) times daily.    Dispense:  60 tablet    Refill:  2   Medications Discontinued During This Encounter  Medication Reason   isosorbide dinitrate (ISORDIL) 30 MG tablet    hydrALAZINE (APRESOLINE) 25 MG tablet     Recommendations:   Marvin Houston  is a 87 y.o.  aortic stenosis that is S/P TAVR on 09/09/2016 and mild CAD, moderate paravalvular regurgitation, aortic root dilation at 4.9 cm.  Patient was seen in the office 1 week ago with concerns of uncontrolled blood pressure at home. EKG in the office revealed marked  sinus bradycardia, therefore atenolol was discontinued. He now presents for follow up of hypertension and bradycardia with repeat EKG. EKG today reveals sinus bradycardia with an improved heart rate of 55 bpm.  Patient's fatigue has significantly improved.  Bilateral lower leg edema has essentially resolved.  We will continue to avoid AV nodal blocking agents.  Regard to hypertension, although does follow-up in the office today remains uncontrolled per home readings.  Advised patient to increase hydralazine 25 mg and isosorbide dinitrate 30 mg from twice daily to 3 times daily.  Patient will continue to monitor his blood pressure on a daily basis at home and again bring with him at his next visit a written log of readings.  Patient will notify our office if blood pressure remains >130/80 mmHg on average.  Will plan to repeat echocardiogram in October 2022 to reevaluate aortic root dilation.  Follow-up in approximately 4 months for hypertension, CAD, echo results, and aortic root dilation.   Marvin Berthold, PA-C 01/21/2021, 12:23 PM Office: [phone]

## 2021-01-18 ENCOUNTER — Other Ambulatory Visit: Payer: Self-pay

## 2021-01-18 ENCOUNTER — Ambulatory Visit: Payer: Medicare Other | Admitting: Student

## 2021-01-18 ENCOUNTER — Encounter: Payer: Self-pay | Admitting: Student

## 2021-01-18 VITALS — BP 126/68 | HR 56 | Ht 70.0 in | Wt 216.6 lb

## 2021-01-18 DIAGNOSIS — I10 Essential (primary) hypertension: Secondary | ICD-10-CM | POA: Diagnosis not present

## 2021-01-18 DIAGNOSIS — R001 Bradycardia, unspecified: Secondary | ICD-10-CM

## 2021-01-18 MED ORDER — HYDRALAZINE HCL 25 MG PO TABS: 25.0000 mg | ORAL_TABLET | Freq: Three times a day (TID) | ORAL | 2 refills | Status: DC

## 2021-01-18 MED ORDER — ISOSORBIDE DINITRATE 30 MG PO TABS: 30.0000 mg | ORAL_TABLET | Freq: Three times a day (TID) | ORAL | 2 refills | Status: DC

## 2021-01-30 DIAGNOSIS — C44622 Squamous cell carcinoma of skin of right upper limb, including shoulder: Secondary | ICD-10-CM | POA: Diagnosis not present

## 2021-01-30 DIAGNOSIS — D485 Neoplasm of uncertain behavior of skin: Secondary | ICD-10-CM | POA: Diagnosis not present

## 2021-01-30 DIAGNOSIS — I10 Essential (primary) hypertension: Secondary | ICD-10-CM | POA: Diagnosis not present

## 2021-02-06 DIAGNOSIS — R195 Other fecal abnormalities: Secondary | ICD-10-CM | POA: Diagnosis not present

## 2021-02-06 DIAGNOSIS — I10 Essential (primary) hypertension: Secondary | ICD-10-CM | POA: Diagnosis not present

## 2021-02-06 DIAGNOSIS — D509 Iron deficiency anemia, unspecified: Secondary | ICD-10-CM | POA: Diagnosis not present

## 2021-02-06 DIAGNOSIS — E119 Type 2 diabetes mellitus without complications: Secondary | ICD-10-CM | POA: Diagnosis not present

## 2021-02-06 DIAGNOSIS — N1832 Chronic kidney disease, stage 3b: Secondary | ICD-10-CM | POA: Diagnosis not present

## 2021-02-06 DIAGNOSIS — K5520 Angiodysplasia of colon without hemorrhage: Secondary | ICD-10-CM | POA: Diagnosis not present

## 2021-02-20 DIAGNOSIS — C44622 Squamous cell carcinoma of skin of right upper limb, including shoulder: Secondary | ICD-10-CM | POA: Diagnosis not present

## 2021-02-27 DIAGNOSIS — N1832 Chronic kidney disease, stage 3b: Secondary | ICD-10-CM | POA: Diagnosis not present

## 2021-02-27 DIAGNOSIS — K5520 Angiodysplasia of colon without hemorrhage: Secondary | ICD-10-CM | POA: Diagnosis not present

## 2021-02-27 DIAGNOSIS — E119 Type 2 diabetes mellitus without complications: Secondary | ICD-10-CM | POA: Diagnosis not present

## 2021-02-27 DIAGNOSIS — R195 Other fecal abnormalities: Secondary | ICD-10-CM | POA: Diagnosis not present

## 2021-02-27 DIAGNOSIS — D509 Iron deficiency anemia, unspecified: Secondary | ICD-10-CM | POA: Diagnosis not present

## 2021-02-27 DIAGNOSIS — I10 Essential (primary) hypertension: Secondary | ICD-10-CM | POA: Diagnosis not present

## 2021-03-05 ENCOUNTER — Other Ambulatory Visit: Payer: Self-pay | Admitting: Cardiology

## 2021-03-05 DIAGNOSIS — I10 Essential (primary) hypertension: Secondary | ICD-10-CM

## 2021-03-26 DIAGNOSIS — R195 Other fecal abnormalities: Secondary | ICD-10-CM | POA: Diagnosis not present

## 2021-03-27 ENCOUNTER — Other Ambulatory Visit: Payer: Self-pay | Admitting: Gastroenterology

## 2021-03-27 DIAGNOSIS — R195 Other fecal abnormalities: Secondary | ICD-10-CM

## 2021-04-26 ENCOUNTER — Inpatient Hospital Stay: Admission: RE | Admit: 2021-04-26 | Payer: Medicare Other | Source: Ambulatory Visit

## 2021-05-01 DIAGNOSIS — H6123 Impacted cerumen, bilateral: Secondary | ICD-10-CM | POA: Diagnosis not present

## 2021-05-10 DIAGNOSIS — L821 Other seborrheic keratosis: Secondary | ICD-10-CM | POA: Diagnosis not present

## 2021-05-10 DIAGNOSIS — L538 Other specified erythematous conditions: Secondary | ICD-10-CM | POA: Diagnosis not present

## 2021-05-10 DIAGNOSIS — L820 Inflamed seborrheic keratosis: Secondary | ICD-10-CM | POA: Diagnosis not present

## 2021-05-10 DIAGNOSIS — D1801 Hemangioma of skin and subcutaneous tissue: Secondary | ICD-10-CM | POA: Diagnosis not present

## 2021-05-10 DIAGNOSIS — Z789 Other specified health status: Secondary | ICD-10-CM | POA: Diagnosis not present

## 2021-05-15 ENCOUNTER — Other Ambulatory Visit: Payer: Self-pay | Admitting: Student

## 2021-05-15 DIAGNOSIS — I10 Essential (primary) hypertension: Secondary | ICD-10-CM

## 2021-05-15 DIAGNOSIS — E119 Type 2 diabetes mellitus without complications: Secondary | ICD-10-CM | POA: Diagnosis not present

## 2021-05-24 ENCOUNTER — Other Ambulatory Visit: Payer: Self-pay

## 2021-05-24 ENCOUNTER — Ambulatory Visit: Payer: Medicare Other

## 2021-05-24 DIAGNOSIS — Z952 Presence of prosthetic heart valve: Secondary | ICD-10-CM

## 2021-05-24 DIAGNOSIS — T8203XS Leakage of heart valve prosthesis, sequela: Secondary | ICD-10-CM

## 2021-05-30 NOTE — Progress Notes (Signed)
Primary Physician/Referring:  Jani Gravel, MD  Patient ID: Marvin Houston, male    DOB: 02/28/1934, 87 y.o.   MRN: [id number]  Chief Complaint  Patient presents with   Follow-up    5 month   Hypertension   echo results   aortic root dilation   Coronary Artery Disease   HPI:    HPI: TRISTIAN Houston  is a 87 y.o. aortic stenosis that is S/P TAVR on 09/09/2016 and mild CAD, moderate paravalvular regurgitation, aortic root dilation at 4.9 cm.  Patient has previously been unable to tolerate beta-blocker therapy due to symptomatic sinus bradycardia.  Patient presents for 4 months of hypertension, CAD, results of cardiac testing.  At last office visit increased hydralazine and isosorbide dinitrate from twice to 3 times daily given uncontrolled hypertension.  Patient's repeat echocardiogram earlier this month is unchanged compared to previous study without aortic root dilation noted.  Patient is feeling well overall, he continues to golf regularly without issue.  Denies chest pain, palpitations, syncope, near syncope, dizziness.  He is now taking isosorbide dinitrate and hydralazine 3 times daily, however he does notice occasional afternoon doses.  He monitors his blood pressure regularly at home reporting averages 130-135/70 mmHg.  Notably patient was late taking his blood pressure medications today in his office blood pressure is elevated.  Past Medical History:  Diagnosis Date   Anemia    Aortic stenosis    Arthritis    Diabetes mellitus without complication ([place])    Dyspnea    GI bleed    not yet determined cause   History of recent blood transfusion 09/02/2016   Hypertension    Stenosis of aorta    Past Surgical History:  Procedure Laterality Date   CARDIAC CATHETERIZATION     HERNIA REPAIR     TEE WITHOUT CARDIOVERSION N/A 07/29/2016   Procedure: TRANSESOPHAGEAL ECHOCARDIOGRAM (TEE);  Surgeon: Adrian Prows, MD;  Location: [place];  Service: Cardiovascular;  Laterality: N/A;   TEE  WITHOUT CARDIOVERSION N/A 09/09/2016   Procedure: TRANSESOPHAGEAL ECHOCARDIOGRAM (TEE);  Surgeon: Sherren Mocha, MD;  Location: [place];  Service: Open Heart Surgery;  Laterality: N/A;   TRANSCATHETER AORTIC VALVE REPLACEMENT, TRANSFEMORAL Bilateral 09/09/2016   Procedure: TRANSCATHETER AORTIC VALVE REPLACEMENT, TRANSFEMORAL;  Surgeon: Sherren Mocha, MD;  Location: [place];  Service: Open Heart Surgery;  Laterality: Bilateral;  History reviewed. No pertinent family history.  Social History   Tobacco Use   Smoking status: Former    Packs/day: 1.00    Years: 10.00    Pack years: 10.00    Types: Cigarettes    Quit date: 1986    Years since quitting: 36.8   Smokeless tobacco: Never  Substance Use Topics   Alcohol use: Yes    Comment: wine nightly   Marital Status: Married   ROS  Review of Systems  Constitutional: Negative for malaise/fatigue and weight gain.  Cardiovascular:  Positive for dyspnea on exertion (chronic, stable ). Negative for chest pain, claudication, leg swelling (resolved), near-syncope, orthopnea, palpitations, paroxysmal nocturnal dyspnea and syncope.  Respiratory:  Negative for shortness of breath.   Neurological:  Negative for dizziness.  Objective  Blood pressure (!) 156/66, pulse 66, temperature 98 F (36.7 C), resp. rate 17, height '5\' 10"'  (1.778 m), weight 212 lb (96.2 kg), SpO2 97 %. Body mass index is 30.42 kg/m.   Physical Exam Vitals reviewed.  Constitutional:      Appearance: He is well-developed.     Comments: Well built, mildly  obese  Cardiovascular:     Rate and Rhythm: Normal rate and regular rhythm.     Pulses: Intact distal pulses.          Femoral pulses are 2+ on the right side and 2+ on the left side.      Popliteal pulses are 2+ on the right side and 1+ on the left side.       Dorsalis pedis pulses are 2+ on the right side and 1+ on the left side.       Posterior tibial pulses are 2+ on the right side and 1+ on the left side.     Heart  sounds: Murmur heard.  Crescendo early systolic murmur is present with a grade of 2/6 at the apex radiating to the neck.  Blowing decrescendo early diastolic murmur is present at the lower left sternal border.  Pulmonary:     Effort: Pulmonary effort is normal. No accessory muscle usage or respiratory distress.     Breath sounds: Normal breath sounds. No wheezing.  Musculoskeletal:     Right lower leg: Edema (trace) present.     Left lower leg: Edema (trace) present.  Neurological:     Mental Status: He is alert and oriented to person, place, and time.    Laboratory examination:   CMP Latest Ref Rng & Units 09/11/2016 09/10/2016 09/09/2016  Glucose 65 - 99 mg/dL 124(H) 164(H) 146(H)  BUN 6 - 20 mg/dL 11 11 -  Creatinine 0.61 - 1.24 mg/dL 1.20 1.11 -  Sodium 135 - 145 mmol/L 142 137 143  Potassium 3.5 - 5.1 mmol/L 4.4 3.5 4.1  Chloride 101 - 111 mmol/L 111 106 -  CO2 22 - 32 mmol/L 26 24 -  Calcium 8.9 - 10.3 mg/dL 8.9 8.3(L) -  Total Protein 6.5 - 8.1 g/dL - - -  Total Bilirubin 0.3 - 1.2 mg/dL - - -  Alkaline Phos 38 - 126 U/L - - -  AST 15 - 41 U/L - - -  ALT 17 - 63 U/L - - -   CBC Latest Ref Rng & Units 09/11/2016 09/10/2016 09/09/2016  WBC 4.0 - 10.5 K/uL 5.6 6.7 5.3  Hemoglobin 13.0 - 17.0 g/dL 9.3(L) 8.8(L) 9.1(L)  Hematocrit 39.0 - 52.0 % 29.1(L) 27.9(L) 27.6(L)  Platelets 150 - 400 K/uL 102(L) 114(L) 106(L)   Lipid Panel  No results found for: CHOL, TRIG, HDL, CHOLHDL, VLDL, LDLCALC, LDLDIRECT HEMOGLOBIN A1C Lab Results  Component Value Date   HGBA1C 4.8 09/08/2016   MPG 91 09/08/2016   TSH No results for input(s): TSH in the last 8760 hours.   External labs: 05/07/2021: Alk phos 60, ALT 15, AST 11, bilirubin 1.7- high BUN 24, creatinine 1.61, GFR 45, glucose 150, potassium 5.2, sodium 142 A1c 5.4% Hemoglobin 9.7, hematocrit 28.4, MCV 96.9, platelets 126  10/03/2020: Hemoglobin 10.1, hematocrit 30.0, platelet 126 Glucose 151, BUN 24, creatinine 1.53, GFR 41,  sodium 140, potassium 4.5 A1c 5.4% Total cholesterol 100 triglycerides 100, HDL 32, LDL 49 TSH 2.72  02/17/2020: Hb 11.1/HCT 32.7, platelets 129.  Normal indicis. Serum glucose 158, BUN 20, creatinine 1.47, EGFR 43 mL.  Potassium 4.5.  CMP otherwise normal. A1c 5.8%.  Labs 03/10/2018: RBC 3.17, hemoglobin 10.1, hematocrit 30, CBC otherwise normal. Creatinine 1.43, potassium 4.8, EGFR 46, CMP normal. Ferritin normal. Hemoglobin A1c 5.6%. Cholesterol 101, triglycerides 94, HDL 33, LDL 49. Iron studies normal.  Allergies  No Known Allergies    Medications Prior to Visit:   Outpatient  Medications Prior to Visit  Medication Sig Dispense Refill   aspirin EC 81 MG EC tablet Take 1 tablet (81 mg total) by mouth daily.     Cyanocobalamin (VITAMIN B-12 PO) Take 1 tablet by mouth every evening.      ferrous sulfate 325 (65 FE) MG tablet Take 1 tablet (325 mg total) by mouth 2 (two) times daily with a meal. 60 tablet 12   hydrALAZINE (APRESOLINE) 25 MG tablet TAKE 1 TABLET(25 MG) BY MOUTH IN THE MORNING AND AT BEDTIME 60 tablet 3   isosorbide dinitrate (ISORDIL) 30 MG tablet TAKE 1 TABLET(30 MG) BY MOUTH TWICE DAILY 180 tablet 0   lovastatin (MEVACOR) 40 MG tablet Take 40 mg by mouth 2 (two) times a week. Mon and Tues     Multiple Vitamin (MULTIVITAMIN WITH MINERALS) TABS tablet Take 1 tablet by mouth every evening.      Multiple Vitamins-Minerals (VISION VITAMINS PO) Take 1 tablet by mouth every evening.      olmesartan-hydrochlorothiazide (BENICAR HCT) 20-12.5 MG tablet Take 1 tablet by mouth daily.     omega-3 fish oil (MAXEPA) 1000 MG CAPS capsule Take 1 capsule by mouth daily.     psyllium (METAMUCIL) 58.6 % powder Take 1 packet by mouth daily.     No facility-administered medications prior to visit.   Final Medications at End of Visit    Current Meds  Medication Sig   aspirin EC 81 MG EC tablet Take 1 tablet (81 mg total) by mouth daily.   Cyanocobalamin (VITAMIN B-12 PO) Take 1 tablet  by mouth every evening.    ferrous sulfate 325 (65 FE) MG tablet Take 1 tablet (325 mg total) by mouth 2 (two) times daily with a meal.   hydrALAZINE (APRESOLINE) 25 MG tablet TAKE 1 TABLET(25 MG) BY MOUTH IN THE MORNING AND AT BEDTIME   isosorbide dinitrate (ISORDIL) 30 MG tablet TAKE 1 TABLET(30 MG) BY MOUTH TWICE DAILY   lovastatin (MEVACOR) 40 MG tablet Take 40 mg by mouth 2 (two) times a week. Mon and Tues   Multiple Vitamin (MULTIVITAMIN WITH MINERALS) TABS tablet Take 1 tablet by mouth every evening.    Multiple Vitamins-Minerals (VISION VITAMINS PO) Take 1 tablet by mouth every evening.    olmesartan-hydrochlorothiazide (BENICAR HCT) 20-12.5 MG tablet Take 1 tablet by mouth daily.   omega-3 fish oil (MAXEPA) 1000 MG CAPS capsule Take 1 capsule by mouth daily.   psyllium (METAMUCIL) 58.6 % powder Take 1 packet by mouth daily.   Radiology:  No results found.  Cardiac Studies:   Coronary angiogram 08/12/2016: 60% to 70% smooth mid RCA stenosis, no change from 2007. Severe calcification of the AV annulus and AV leaflets. Severe mitral annular calcification.  Echocardiogram 05/13/2019:  Normal LV systolic function with EF 56%. Mild concentric hypertrophy of  the left ventricle. Left ventricle cavity is normal in size. Normal global  wall motion. Doppler evidence of grade I (impaired) diastolic dysfunction,  normal LAP.  Left atrial cavity is mildly dilated.  Bioprosthetic aortic valve.  Mild aortic valve stenosis. Aortic valve mean  gradient of 12 mmHg, Vmax of 2.4  m/s. Calculated aortic valve area by  continuity equation is 1.2 cm. Moderate (Grade II) paravalvular aortic  regurgitation.  Mild to moderate mitral regurgitation.  Mild pulmonic regurgitation.  IVC is dilated with a respiratory response of >50%. Estimated RA pressure  8 mmHg.  No significant change compared to previous study on 10/08/2018.   PCV ECHOCARDIOGRAM COMPLETE 05/24/2021 Left  ventricle cavity is normal in size.  Moderate concentric hypertrophy of the left ventricle. Normal global wall motion. Normal LV systolic function with EF 56%. Indeterminate diastolic filling pattern. Well seated Edwards Sapien 3 THV (size 29 mm) valve.  Moderate (Grade II) paravalvular aortic regurgitation. Aortic valve mean gradient of 16 mmHg, Vmax of 2.4  m/s, likely higher due to aortic regurgitation. Moderate (Grade II) mitral regurgitation. Estimated right atrial pressure 8 mmHg. No significant change compared to previous study on 05/12/2021.   EKG   01/18/2021: Sinus bradycardia with first-degree AV block at a rate of 55 bpm.  Normal axis.  IVCD.   01/11/2021: Marked sinus bradycardia with first-degree AV block at a rate of 45 bpm.  IVCD.  Borderline criteria for LVH.  EKG 04/06/2020: Sinus rhythm with first-degree block at rate of 64 bpm, left atrial enlargement, normal axis.  IVCD, borderline criteria for LVH.  Normal QT interval, no evidence of ischemia.  No significant change from 03/25/2019.  Assessment     ICD-10-CM   1. Essential hypertension  I10     2. Sinus bradycardia by electrocardiogram  R00.1     3. S/P TAVR (transcatheter aortic valve replacement)  Z95.2 PCV ECHOCARDIOGRAM COMPLETE    4. Nonrheumatic mitral valve regurgitation  I34.0 PCV ECHOCARDIOGRAM COMPLETE      No orders of the defined types were placed in this encounter.  There are no discontinued medications.   Recommendations:   TRIG MCBRYAR  is a 87 y.o.  aortic stenosis that is S/P TAVR on 09/09/2016 and mild CAD, moderate paravalvular regurgitation, aortic root dilation at 4.9 cm.  Patient has previously been unable to tolerate beta-blocker therapy due to symptomatic sinus bradycardia.  Patient presents for 4 months of hypertension, CAD, results of cardiac testing.  At last office visit increased hydralazine and isosorbide dinitrate from twice to 3 times daily given uncontrolled hypertension.  Patient's repeat echocardiogram earlier this  month is unchanged compared to previous study without aortic root dilation noted.  Patient is doing well overall.  He is no longer bradycardic and is back to playing golf on a regular basis without issue.  Reviewed and discussed with patient results of echocardiogram above.  Given valvular disease we will plan to repeat echocardiogram in 1 year.  In regard to hypertension, patient's blood pressure is elevated today, however it is typically well controlled at home.  Will not make changes to patient's medications at this time.  He will continue to monitor blood pressure at home and notify our office if his >130/80 mmHg.  Follow-up in 1 year, sooner if needed, for CAD, MR, results of echocardiogram.   Alethia Berthold, PA-C 05/31/2021, 4:09 PM Office: [phone]

## 2021-05-31 ENCOUNTER — Other Ambulatory Visit: Payer: Self-pay

## 2021-05-31 ENCOUNTER — Encounter: Payer: Self-pay | Admitting: Student

## 2021-05-31 ENCOUNTER — Ambulatory Visit: Payer: Medicare Other | Admitting: Student

## 2021-05-31 VITALS — BP 156/66 | HR 66 | Temp 98.0°F | Resp 17 | Ht 70.0 in | Wt 212.0 lb

## 2021-05-31 DIAGNOSIS — R001 Bradycardia, unspecified: Secondary | ICD-10-CM | POA: Diagnosis not present

## 2021-05-31 DIAGNOSIS — Z952 Presence of prosthetic heart valve: Secondary | ICD-10-CM

## 2021-05-31 DIAGNOSIS — I10 Essential (primary) hypertension: Secondary | ICD-10-CM | POA: Diagnosis not present

## 2021-05-31 DIAGNOSIS — I340 Nonrheumatic mitral (valve) insufficiency: Secondary | ICD-10-CM | POA: Diagnosis not present

## 2021-05-31 DIAGNOSIS — D509 Iron deficiency anemia, unspecified: Secondary | ICD-10-CM | POA: Diagnosis not present

## 2021-05-31 DIAGNOSIS — E119 Type 2 diabetes mellitus without complications: Secondary | ICD-10-CM | POA: Diagnosis not present

## 2021-06-07 DIAGNOSIS — I10 Essential (primary) hypertension: Secondary | ICD-10-CM | POA: Diagnosis not present

## 2021-06-07 DIAGNOSIS — N1832 Chronic kidney disease, stage 3b: Secondary | ICD-10-CM | POA: Diagnosis not present

## 2021-06-07 DIAGNOSIS — K5520 Angiodysplasia of colon without hemorrhage: Secondary | ICD-10-CM | POA: Diagnosis not present

## 2021-06-07 DIAGNOSIS — D509 Iron deficiency anemia, unspecified: Secondary | ICD-10-CM | POA: Diagnosis not present

## 2021-06-07 DIAGNOSIS — E119 Type 2 diabetes mellitus without complications: Secondary | ICD-10-CM | POA: Diagnosis not present

## 2021-06-07 DIAGNOSIS — E785 Hyperlipidemia, unspecified: Secondary | ICD-10-CM | POA: Diagnosis not present

## 2021-06-10 NOTE — Progress Notes (Signed)
05/31/2021: HCT 29.9, Hgb 10.2, MCV 92.9, platelet 151 BUN 24, creatinine 1.59, GFR 45, glucose 192, potassium 4.7, sodium 140 A1c 6.1% Total cholesterol 115, HDL 35, LDL 57, triglycerides 115

## 2021-07-22 ENCOUNTER — Other Ambulatory Visit: Payer: Self-pay

## 2021-07-22 DIAGNOSIS — I10 Essential (primary) hypertension: Secondary | ICD-10-CM

## 2021-07-22 MED ORDER — HYDRALAZINE HCL 25 MG PO TABS: ORAL_TABLET | ORAL | 3 refills | Status: DC

## 2021-07-30 ENCOUNTER — Encounter (HOSPITAL_BASED_OUTPATIENT_CLINIC_OR_DEPARTMENT_OTHER): Payer: Self-pay

## 2021-07-30 ENCOUNTER — Other Ambulatory Visit: Payer: Self-pay

## 2021-07-30 ENCOUNTER — Emergency Department (HOSPITAL_BASED_OUTPATIENT_CLINIC_OR_DEPARTMENT_OTHER)
Admission: EM | Admit: 2021-07-30 | Discharge: 2021-07-30 | Disposition: A | Discharge status: Home / Self Care | Payer: Medicare Other | Attending: Emergency Medicine | Admitting: Emergency Medicine

## 2021-07-30 ENCOUNTER — Emergency Department (HOSPITAL_BASED_OUTPATIENT_CLINIC_OR_DEPARTMENT_OTHER): Payer: Medicare Other | Admitting: Radiology

## 2021-07-30 DIAGNOSIS — I10 Essential (primary) hypertension: Secondary | ICD-10-CM | POA: Insufficient documentation

## 2021-07-30 DIAGNOSIS — I2510 Atherosclerotic heart disease of native coronary artery without angina pectoris: Secondary | ICD-10-CM | POA: Diagnosis not present

## 2021-07-30 DIAGNOSIS — U071 COVID-19: Secondary | ICD-10-CM | POA: Diagnosis not present

## 2021-07-30 DIAGNOSIS — Z79899 Other long term (current) drug therapy: Secondary | ICD-10-CM | POA: Diagnosis not present

## 2021-07-30 DIAGNOSIS — Z7982 Long term (current) use of aspirin: Secondary | ICD-10-CM | POA: Diagnosis not present

## 2021-07-30 DIAGNOSIS — E119 Type 2 diabetes mellitus without complications: Secondary | ICD-10-CM | POA: Diagnosis not present

## 2021-07-30 DIAGNOSIS — Z87891 Personal history of nicotine dependence: Secondary | ICD-10-CM | POA: Diagnosis not present

## 2021-07-30 DIAGNOSIS — R059 Cough, unspecified: Secondary | ICD-10-CM | POA: Diagnosis not present

## 2021-07-30 LAB — BASIC METABOLIC PANEL
Anion gap: 8 (ref 5–15)
BUN: 23 mg/dL (ref 8–23)
CO2: 25 mmol/L (ref 22–32)
Calcium: 9.3 mg/dL (ref 8.9–10.3)
Chloride: 102 mmol/L (ref 98–111)
Creatinine, Ser: 1.67 mg/dL — ABNORMAL HIGH (ref 0.61–1.24)
GFR, Estimated: 40 mL/min — ABNORMAL LOW (ref >60)
Glucose, Bld: 174 mg/dL — ABNORMAL HIGH (ref 70–99)
Potassium: 3.9 mmol/L (ref 3.5–5.1)
Sodium: 135 mmol/L (ref 135–145)

## 2021-07-30 LAB — RESP PANEL BY RT-PCR (FLU A&B, COVID) ARPGX2
Influenza A by PCR: NEGATIVE
Influenza B by PCR: NEGATIVE
SARS Coronavirus 2 by RT PCR: POSITIVE — AB

## 2021-07-30 MED ORDER — NIRMATRELVIR/RITONAVIR (PAXLOVID) TABLET (RENAL DOSING)
2.0000 | ORAL_TABLET | Freq: Two times a day (BID) | ORAL | 0 refills | Status: AC
Start: 2021-07-30 — End: 2021-08-04

## 2021-07-30 MED ORDER — BENZONATATE 100 MG PO CAPS: 100.0000 mg | ORAL_CAPSULE | Freq: Three times a day (TID) | ORAL | 0 refills | Status: DC | PRN

## 2021-07-30 NOTE — ED Triage Notes (Signed)
Patient here POV from Home with Flu-Like Symptoms.   Patient has been having Sore Throat, Chills, Subjective Fevers, Cough, Nasal Congestion for approximately 3 days. Patient last night had difficulty sleeping due to Nasal Drainage.  NAD Noted during Triage. A&Ox4. GCS 15. BIB Wheelchair.

## 2021-07-30 NOTE — ED Provider Notes (Signed)
[facility] EMERGENCY DEPT Provider Note   CSN: [id number] Arrival date & time: 07/30/21  1218     History Chief Complaint  Patient presents with   Cough    Marvin Houston is a 87 y.o. male.   Cough Associated symptoms: chills, fever and sore throat   Associated symptoms: no chest pain, no ear pain, no headaches, no myalgias, no rash, no shortness of breath and no wheezing   Patient presents with URI symptoms for the past 3 days.  The symptoms include sore throat, fevers, chills, cough, and nasal congestion.  Sore throat is worsened by postnasal drainage.  He denies any shortness of breath.  He has been able to take in normal p.o. intake.  He has not had vomiting or diarrhea.  Medical history is notable for DM, HTN, aortic stenosis s/p TAVR, and CAD.  He is typically quite active and golfs several times a week when the weather allows.  He has not had any recent known sick contacts.    Past Medical History:  Diagnosis Date   Anemia    Aortic stenosis    Arthritis    Diabetes mellitus without complication ([place])    Dyspnea    GI bleed    not yet determined cause   History of recent blood transfusion 09/02/2016   Hypertension    Stenosis of aorta     Patient Active Problem List   Diagnosis Date Noted   Severe aortic stenosis 09/09/2016   Acute lower GI bleeding 09/02/2016   Aortic stenosis, severe 08/09/2016    Past Surgical History:  Procedure Laterality Date   CARDIAC CATHETERIZATION     HERNIA REPAIR     TEE WITHOUT CARDIOVERSION N/A 07/29/2016   Procedure: TRANSESOPHAGEAL ECHOCARDIOGRAM (TEE);  Surgeon: Adrian Prows, MD;  Location: [facility];  Service: Cardiovascular;  Laterality: N/A;   TEE WITHOUT CARDIOVERSION N/A 09/09/2016   Procedure: TRANSESOPHAGEAL ECHOCARDIOGRAM (TEE);  Surgeon: Sherren Mocha, MD;  Location: [place];  Service: Open Heart Surgery;  Laterality: N/A;   TRANSCATHETER AORTIC VALVE REPLACEMENT, TRANSFEMORAL Bilateral 09/09/2016    Procedure: TRANSCATHETER AORTIC VALVE REPLACEMENT, TRANSFEMORAL;  Surgeon: Sherren Mocha, MD;  Location: [place];  Service: Open Heart Surgery;  Laterality: Bilateral;       No family history on file.  Social History   Tobacco Use   Smoking status: Former    Packs/day: 1.00    Years: 10.00    Pack years: 10.00    Types: Cigarettes    Quit date: 1986    Years since quitting: 36.9   Smokeless tobacco: Never  Vaping Use   Vaping Use: Never used  Substance Use Topics   Alcohol use: Yes    Comment: wine nightly   Drug use: No    Home Medications Prior to Admission medications   Medication Sig Start Date End Date Taking? Authorizing Provider  benzonatate (TESSALON) 100 MG capsule Take 1 capsule (100 mg total) by mouth 3 (three) times daily as needed for up to 9 doses for cough. 07/30/21  Yes Godfrey Pick, MD  nirmatrelvir/ritonavir EUA, renal dosing, (PAXLOVID) 10 x 150 MG & 10 x 100MG  TABS Take 2 tablets by mouth 2 (two) times daily for 5 days. Patient GFR is 40. Take nirmatrelvir (150 mg) one tablet twice daily for 5 days and ritonavir (100 mg) one tablet twice daily for 5 days. 07/30/21 08/04/21 Yes Godfrey Pick, MD  aspirin EC 81 MG EC tablet Take 1 tablet (81 mg total) by mouth  daily. 09/12/16   Arbutus Leas, NP  Cyanocobalamin (VITAMIN B-12 PO) Take 1 tablet by mouth every evening.     [provider]  ferrous sulfate 325 (65 FE) MG tablet Take 1 tablet (325 mg total) by mouth 2 (two) times daily with a meal. 09/04/16   Arbutus Leas, NP  hydrALAZINE (APRESOLINE) 25 MG tablet TAKE 1 TABLET(25 MG) BY MOUTH IN THE MORNING AND AT BEDTIME 07/22/21   Cantwell, Celeste C, PA-C  isosorbide dinitrate (ISORDIL) 30 MG tablet TAKE 1 TABLET(30 MG) BY MOUTH TWICE DAILY 05/15/21   Cantwell, Celeste C, PA-C  lovastatin (MEVACOR) 40 MG tablet Take 40 mg by mouth 2 (two) times a week. Mon and [employer], Historical, MD  Multiple Vitamin (MULTIVITAMIN WITH MINERALS) TABS tablet Take 1  tablet by mouth every evening.     [provider]  Multiple Vitamins-Minerals (VISION VITAMINS PO) Take 1 tablet by mouth every evening.     [provider]  olmesartan-hydrochlorothiazide (BENICAR HCT) 20-12.5 MG tablet Take 1 tablet by mouth daily. 03/28/20   [provider]  omega-3 fish oil (MAXEPA) 1000 MG CAPS capsule Take 1 capsule by mouth daily.    [provider]  psyllium (METAMUCIL) 58.6 % powder Take 1 packet by mouth daily.    [provider]    Allergies    Patient has no known allergies.  Review of Systems   Review of Systems  Constitutional:  Positive for chills, fatigue and fever.  HENT:  Positive for congestion, postnasal drip, sinus pressure and sore throat. Negative for ear pain, trouble swallowing and voice change.   Eyes:  Negative for pain and visual disturbance.  Respiratory:  Positive for cough. Negative for chest tightness, shortness of breath and wheezing.   Cardiovascular:  Negative for chest pain, palpitations and leg swelling.  Gastrointestinal:  Negative for abdominal pain, diarrhea, nausea and vomiting.  Genitourinary:  Negative for dysuria, flank pain and hematuria.  Musculoskeletal:  Negative for arthralgias, back pain, gait problem, joint swelling, myalgias and neck pain.  Skin:  Negative for color change and rash.  Neurological:  Negative for dizziness, seizures, syncope, weakness, light-headedness, numbness and headaches.  Psychiatric/Behavioral:  Negative for confusion and decreased concentration.   All other systems reviewed and are negative.  Physical Exam Updated Vital Signs BP 138/75 (BP Location: Right Arm)    Pulse 91    Temp 99.7 F (37.6 C) (Oral)    Resp 19    Ht 5\' 10"  (1.778 m)    Wt 96.2 kg    SpO2 99%    BMI 30.43 kg/m   Physical Exam Vitals and nursing note reviewed.  Constitutional:      General: He is not in acute distress.    Appearance: Normal appearance. He is well-developed and  normal weight. He is not ill-appearing, toxic-appearing or diaphoretic.  HENT:     Head: Normocephalic and atraumatic.     Right Ear: External ear normal.     Left Ear: External ear normal.     Nose: Congestion present.     Mouth/Throat:     Mouth: Mucous membranes are moist.     Pharynx: Oropharynx is clear. No oropharyngeal exudate or posterior oropharyngeal erythema.  Eyes:     General: No scleral icterus.    Extraocular Movements: Extraocular movements intact.     Conjunctiva/sclera: Conjunctivae normal.  Cardiovascular:     Rate and Rhythm: Normal rate and regular rhythm.  Heart sounds: No murmur heard. Pulmonary:     Effort: Pulmonary effort is normal. No respiratory distress.     Breath sounds: Normal breath sounds. No wheezing, rhonchi or rales.  Chest:     Chest wall: No tenderness.  Abdominal:     General: Abdomen is flat.     Palpations: Abdomen is soft.     Tenderness: There is no abdominal tenderness.  Musculoskeletal:        General: No swelling.     Cervical back: Normal range of motion and neck supple. No rigidity.     Right lower leg: No edema.     Left lower leg: No edema.  Skin:    General: Skin is warm and dry.     Capillary Refill: Capillary refill takes less than 2 seconds.     Coloration: Skin is not jaundiced or pale.  Neurological:     General: No focal deficit present.     Mental Status: He is alert and oriented to person, place, and time.     Cranial Nerves: No cranial nerve deficit.     Sensory: No sensory deficit.     Motor: No weakness.     Coordination: Coordination normal.  Psychiatric:        Mood and Affect: Mood normal.        Behavior: Behavior normal.        Thought Content: Thought content normal.        Judgment: Judgment normal.    ED Results / Procedures / Treatments   Labs (all labs ordered are listed, but only abnormal results are displayed) Labs Reviewed  RESP PANEL BY RT-PCR (FLU A&B, COVID) ARPGX2 - Abnormal; Notable  for the following components:      Result Value   SARS Coronavirus 2 by RT PCR POSITIVE (*)    All other components within normal limits  BASIC METABOLIC PANEL - Abnormal; Notable for the following components:   Glucose, Bld 174 (*)    Creatinine, Ser 1.67 (*)    GFR, Estimated 40 (*)    All other components within normal limits    EKG None  Radiology DG Chest 2 View  Result Date: 07/30/2021 CLINICAL DATA:  Cough chest and head congestion EXAM: CHEST - 2 VIEW COMPARISON:  Chest radiograph 09/10/2016 FINDINGS: The cardiomediastinal silhouette is normal. An aortic valve prosthesis is again noted. There is no focal consolidation or pulmonary edema. There is no pleural effusion or pneumothorax. There is no acute osseous abnormality. IMPRESSION: No radiographic evidence of acute cardiopulmonary process. Electronically Signed   By: Valetta Mole M.D.   On: 07/30/2021 13:09    Procedures Procedures   Medications Ordered in ED Medications - No data to display  ED Course  I have reviewed the triage vital signs and the nursing notes.  Pertinent labs & imaging results that were available during my care of the patient were reviewed by me and considered in my medical decision making (see chart for details).    MDM Rules/Calculators/A&P                         Patient is a healthy 87 year old male presenting for 3 days of cold/flu symptoms.  He is afebrile and well-appearing upon arrival.  Breathing is even and unlabored.  SPO2 is normal on room air.  Lungs are clear to auscultation.  Diagnostic work-up showed COVID-19 infection.  No evidence of pneumonia is identified on chest x-ray.  I did speak with the patient about Paxlovid therapy.  He stated that he would be interested in this.  BMP was obtained to assess kidney function.  Patient was provided with a prescription for renally dosed Paxlovid.  He was advised to discontinue his statin while taking this medication.  He was also advised to  return to the ED if he does have worsening symptoms from this infection.  He was discharged in good condition.  Final Clinical Impression(s) / ED Diagnoses Final diagnoses:  [id number]    Rx / DC Orders ED Discharge Orders          Ordered    nirmatrelvir/ritonavir EUA, renal dosing, (PAXLOVID) 10 x 150 MG & 10 x 100MG  TABS  2 times daily        07/30/21 1531    benzonatate (TESSALON) 100 MG capsule  3 times daily PRN        07/30/21 1535             Godfrey Pick, MD 07/31/21 2151

## 2021-07-30 NOTE — Discharge Instructions (Addendum)
There is a prescription attached for Paxlovid.  This is an antiviral medication to lessen the severity and duration of your COVID-19 infection.  While taking this medication, do not take your lovastatin.  This will avoid interactions between the 2 drugs.  Take Tylenol for symptom relief.  Be aware that other over-the-counter cold and flu medicines also contain the active ingredient in Tylenol (acetaminophen).  Avoid taking more than 4000 mg of cumulative acetaminophen in any 24-hour period.  There is also prescription attached for Tessalon.  This is a medication that can help with cough.  Take this only as needed.  If you develop worsening symptoms, please return to the emergency department.

## 2021-08-14 ENCOUNTER — Other Ambulatory Visit: Payer: Self-pay | Admitting: Student

## 2021-08-14 DIAGNOSIS — I10 Essential (primary) hypertension: Secondary | ICD-10-CM

## 2021-09-10 ENCOUNTER — Ambulatory Visit
Admission: RE | Admit: 2021-09-10 | Discharge: 2021-09-10 | Disposition: A | Discharge status: Home / Self Care | Payer: Medicare Other | Source: Ambulatory Visit | Attending: Gastroenterology | Admitting: Gastroenterology

## 2021-09-10 DIAGNOSIS — R195 Other fecal abnormalities: Secondary | ICD-10-CM

## 2021-10-18 ENCOUNTER — Other Ambulatory Visit: Payer: Self-pay | Admitting: Student

## 2021-10-18 DIAGNOSIS — I10 Essential (primary) hypertension: Secondary | ICD-10-CM

## 2021-10-31 NOTE — Progress Notes (Signed)
External labs 10/22/2021: ?A1c 5.9% ?Total cholesterol 1 2, HDL 31, LDL 51, triglycerides 99 ?TSH 2.69 ?Hgb 8.1, HCT 24.6, MCV 98.0, platelet 191 ?BUN 21, creatinine 1.77, potassium 4.4, sodium 141, GFR 40 ?

## 2022-01-08 DIAGNOSIS — D509 Iron deficiency anemia, unspecified: Secondary | ICD-10-CM | POA: Diagnosis not present

## 2022-01-08 DIAGNOSIS — E785 Hyperlipidemia, unspecified: Secondary | ICD-10-CM | POA: Diagnosis not present

## 2022-01-08 DIAGNOSIS — Z952 Presence of prosthetic heart valve: Secondary | ICD-10-CM | POA: Diagnosis not present

## 2022-01-08 DIAGNOSIS — I129 Hypertensive chronic kidney disease with stage 1 through stage 4 chronic kidney disease, or unspecified chronic kidney disease: Secondary | ICD-10-CM | POA: Diagnosis not present

## 2022-01-08 DIAGNOSIS — N1832 Chronic kidney disease, stage 3b: Secondary | ICD-10-CM | POA: Diagnosis not present

## 2022-01-13 ENCOUNTER — Other Ambulatory Visit: Payer: Self-pay | Admitting: Student

## 2022-01-13 DIAGNOSIS — I10 Essential (primary) hypertension: Secondary | ICD-10-CM

## 2022-02-07 DIAGNOSIS — E119 Type 2 diabetes mellitus without complications: Secondary | ICD-10-CM | POA: Diagnosis not present

## 2022-02-07 DIAGNOSIS — D509 Iron deficiency anemia, unspecified: Secondary | ICD-10-CM | POA: Diagnosis not present

## 2022-02-19 DIAGNOSIS — E119 Type 2 diabetes mellitus without complications: Secondary | ICD-10-CM | POA: Diagnosis not present

## 2022-02-19 DIAGNOSIS — N1832 Chronic kidney disease, stage 3b: Secondary | ICD-10-CM | POA: Diagnosis not present

## 2022-02-19 DIAGNOSIS — E7800 Pure hypercholesterolemia, unspecified: Secondary | ICD-10-CM | POA: Diagnosis not present

## 2022-02-19 DIAGNOSIS — K5520 Angiodysplasia of colon without hemorrhage: Secondary | ICD-10-CM | POA: Diagnosis not present

## 2022-02-19 DIAGNOSIS — D509 Iron deficiency anemia, unspecified: Secondary | ICD-10-CM | POA: Diagnosis not present

## 2022-02-19 DIAGNOSIS — I2510 Atherosclerotic heart disease of native coronary artery without angina pectoris: Secondary | ICD-10-CM | POA: Diagnosis not present

## 2022-02-19 DIAGNOSIS — I10 Essential (primary) hypertension: Secondary | ICD-10-CM | POA: Diagnosis not present

## 2022-02-24 NOTE — Progress Notes (Signed)
External labs 02/07/2022: A1c 6.4% Total cholesterol 102, HDL 35, LDL 48, triglycerides 97 Hgb 10.5, HCT 30.1, MCV 93.5, platelets 147 BUN 26, creatinine 1.74, potassium 4.3, sodium 138, GFR 35 TSH 2.69

## 2022-03-14 DIAGNOSIS — H2511 Age-related nuclear cataract, right eye: Secondary | ICD-10-CM | POA: Diagnosis not present

## 2022-03-14 DIAGNOSIS — E119 Type 2 diabetes mellitus without complications: Secondary | ICD-10-CM | POA: Diagnosis not present

## 2022-04-22 ENCOUNTER — Other Ambulatory Visit: Payer: Self-pay

## 2022-04-22 DIAGNOSIS — I10 Essential (primary) hypertension: Secondary | ICD-10-CM

## 2022-04-22 MED ORDER — HYDRALAZINE HCL 25 MG PO TABS: ORAL_TABLET | ORAL | 0 refills | Status: DC

## 2022-05-19 ENCOUNTER — Other Ambulatory Visit: Payer: Medicare Other

## 2022-05-20 ENCOUNTER — Other Ambulatory Visit: Payer: Self-pay

## 2022-05-20 ENCOUNTER — Ambulatory Visit: Payer: Medicare Other

## 2022-05-20 DIAGNOSIS — Z952 Presence of prosthetic heart valve: Secondary | ICD-10-CM

## 2022-05-20 DIAGNOSIS — I340 Nonrheumatic mitral (valve) insufficiency: Secondary | ICD-10-CM

## 2022-05-20 DIAGNOSIS — I10 Essential (primary) hypertension: Secondary | ICD-10-CM

## 2022-05-20 MED ORDER — ISOSORBIDE DINITRATE 30 MG PO TABS: ORAL_TABLET | ORAL | 0 refills | Status: DC

## 2022-06-02 ENCOUNTER — Ambulatory Visit: Payer: Medicare Other | Admitting: Student

## 2022-06-02 ENCOUNTER — Encounter: Payer: Self-pay | Admitting: Internal Medicine

## 2022-06-02 ENCOUNTER — Ambulatory Visit: Payer: Medicare Other | Admitting: Internal Medicine

## 2022-06-02 VITALS — BP 135/62 | HR 77 | Temp 98.0°F | Resp 16 | Ht 70.0 in | Wt 213.3 lb

## 2022-06-02 DIAGNOSIS — Z952 Presence of prosthetic heart valve: Secondary | ICD-10-CM

## 2022-06-02 DIAGNOSIS — I10 Essential (primary) hypertension: Secondary | ICD-10-CM

## 2022-06-02 DIAGNOSIS — I77810 Thoracic aortic ectasia: Secondary | ICD-10-CM

## 2022-06-02 NOTE — Progress Notes (Signed)
Primary Physician/Referring:  Holland Commons, FNP  Patient ID: Marvin Houston, male    DOB: September 06, 1933, 88 y.o.   MRN: [id number]  Chief Complaint  Patient presents with   Coronary Artery Disease   Mitral Regurgitation   Hx of aortic root dilation   Follow-up    1 year   HPI:    [name]  is a 88 y.o. aortic stenosis that is S/P TAVR on 09/09/2016 and mild CAD, moderate paravalvular regurgitation, aortic root dilation at 4.9 cm.  Patient has previously been unable to tolerate beta-blocker therapy due to symptomatic sinus bradycardia.  He has been doing well since his last office visit. Denies chest pain, shortness of breath, palpitations, diaphoresis, syncope.   Past Medical History:  Diagnosis Date   Anemia    Aortic stenosis    Arthritis    Diabetes mellitus without complication ([place])    Dyspnea    GI bleed    not yet determined cause   History of recent blood transfusion 09/02/2016   Hyperlipidemia    Hypertension    Stenosis of aorta    Past Surgical History:  Procedure Laterality Date   CARDIAC CATHETERIZATION     HERNIA REPAIR     TEE WITHOUT CARDIOVERSION N/A 07/29/2016   Procedure: TRANSESOPHAGEAL ECHOCARDIOGRAM (TEE);  Surgeon: Adrian Prows, MD;  Location: [facility];  Service: Cardiovascular;  Laterality: N/A;   TEE WITHOUT CARDIOVERSION N/A 09/09/2016   Procedure: TRANSESOPHAGEAL ECHOCARDIOGRAM (TEE);  Surgeon: Sherren Mocha, MD;  Location: [place];  Service: Open Heart Surgery;  Laterality: N/A;   TRANSCATHETER AORTIC VALVE REPLACEMENT, TRANSFEMORAL Bilateral 09/09/2016   Procedure: TRANSCATHETER AORTIC VALVE REPLACEMENT, TRANSFEMORAL;  Surgeon: Sherren Mocha, MD;  Location: [facility];  Service: Open Heart Surgery;  Laterality: Bilateral;  History reviewed. No pertinent family history.  Social History   Tobacco Use   Smoking status: Former    Packs/day: 1.00    Years: 10.00    Total pack years: 10.00    Types: Cigarettes    Quit date: 1986     Years since quitting: 37.8   Smokeless tobacco: Never  Substance Use Topics   Alcohol use: Yes    Comment: wine nightly   Marital Status: Married   ROS  Review of Systems  Constitutional: Negative for malaise/fatigue and weight gain.  Cardiovascular:  Negative for chest pain, claudication, dyspnea on exertion, leg swelling, near-syncope, orthopnea, palpitations, paroxysmal nocturnal dyspnea and syncope.  Respiratory:  Negative for shortness of breath.   Neurological:  Negative for dizziness.   Objective  Blood pressure 135/62, pulse 77, temperature 98 F (36.7 C), temperature source Temporal, resp. rate 16, height '5\' 10"'  (1.778 m), weight 213 lb 4.8 oz (96.8 kg), SpO2 95 %. Body mass index is 30.61 kg/m.   Physical Exam Vitals reviewed.  Constitutional:      Appearance: He is well-developed.     Comments: Well built, mildly obese  Cardiovascular:     Rate and Rhythm: Normal rate and regular rhythm.     Pulses: Intact distal pulses.          Femoral pulses are 2+ on the right side and 2+ on the left side.      Popliteal pulses are 2+ on the right side and 1+ on the left side.       Dorsalis pedis pulses are 2+ on the right side and 1+ on the left side.       Posterior tibial pulses are 2+  on the right side and 1+ on the left side.     Heart sounds: Murmur heard.     Crescendo early systolic murmur is present with a grade of 2/6 at the apex radiating to the neck.     Blowing decrescendo early diastolic murmur is present at the lower left sternal border.  Pulmonary:     Effort: Pulmonary effort is normal. No accessory muscle usage or respiratory distress.     Breath sounds: Normal breath sounds. No wheezing.  Musculoskeletal:     Right lower leg: Edema (trace) present.     Left lower leg: Edema (trace) present.  Neurological:     Mental Status: He is alert and oriented to person, place, and time.     Laboratory examination:      Latest Ref Rng & Units 07/30/2021    2:59  PM 09/11/2016    3:37 AM 09/10/2016    3:55 AM  CMP  Glucose 70 - 99 mg/dL 174  124  164   BUN 8 - 23 mg/dL '23  11  11   ' Creatinine 0.61 - 1.24 mg/dL 1.67  1.20  1.11   Sodium 135 - 145 mmol/L 135  142  137   Potassium 3.5 - 5.1 mmol/L 3.9  4.4  3.5   Chloride 98 - 111 mmol/L 102  111  106   CO2 22 - 32 mmol/L '25  26  24   ' Calcium 8.9 - 10.3 mg/dL 9.3  8.9  8.3       Latest Ref Rng & Units 09/11/2016    3:37 AM 09/10/2016    3:55 AM 09/09/2016   11:25 AM  CBC  WBC 4.0 - 10.5 K/uL 5.6  6.7  5.3   Hemoglobin 13.0 - 17.0 g/dL 9.3  8.8  9.1   Hematocrit 39.0 - 52.0 % 29.1  27.9  27.6   Platelets 150 - 400 K/uL 102  114  106    Lipid Panel  No results found for: "CHOL", "TRIG", "HDL", "CHOLHDL", "VLDL", "LDLCALC", "LDLDIRECT" HEMOGLOBIN A1C Lab Results  Component Value Date   HGBA1C 4.8 09/08/2016   MPG 91 09/08/2016   TSH No results for input(s): "TSH" in the last 8760 hours.   External labs: 05/07/2021: Alk phos 60, ALT 15, AST 11, bilirubin 1.7- high BUN 24, creatinine 1.61, GFR 45, glucose 150, potassium 5.2, sodium 142 A1c 5.4% Hemoglobin 9.7, hematocrit 28.4, MCV 96.9, platelets 126  10/03/2020: Hemoglobin 10.1, hematocrit 30.0, platelet 126 Glucose 151, BUN 24, creatinine 1.53, GFR 41, sodium 140, potassium 4.5 A1c 5.4% Total cholesterol 100 triglycerides 100, HDL 32, LDL 49 TSH 2.72  02/17/2020: Hb 11.1/HCT 32.7, platelets 129.  Normal indicis. Serum glucose 158, BUN 20, creatinine 1.47, EGFR 43 mL.  Potassium 4.5.  CMP otherwise normal. A1c 5.8%.  Labs 03/10/2018: RBC 3.17, hemoglobin 10.1, hematocrit 30, CBC otherwise normal. Creatinine 1.43, potassium 4.8, EGFR 46, CMP normal. Ferritin normal. Hemoglobin A1c 5.6%. Cholesterol 101, triglycerides 94, HDL 33, LDL 49. Iron studies normal.  Allergies  No Known Allergies    Medications Prior to Visit:   Outpatient Medications Prior to Visit  Medication Sig Dispense Refill   Cyanocobalamin (VITAMIN B-12 PO) Take 1  tablet by mouth every evening.      ferrous sulfate 325 (65 FE) MG tablet Take 1 tablet (325 mg total) by mouth 2 (two) times daily with a meal. 60 tablet 12   hydrALAZINE (APRESOLINE) 25 MG tablet TAKE 1 TABLET(25 MG) BY MOUTH  IN THE MORNING AND AT BEDTIME 60 tablet 0   isosorbide dinitrate (ISORDIL) 30 MG tablet TAKE 1 TABLET(30 MG) BY MOUTH TWICE DAILY 180 tablet 0   lovastatin (MEVACOR) 40 MG tablet Take 40 mg by mouth 2 (two) times a week. Mon and Tues     Multiple Vitamin (MULTIVITAMIN WITH MINERALS) TABS tablet Take 1 tablet by mouth every evening.      Multiple Vitamins-Minerals (VISION VITAMINS PO) Take 1 tablet by mouth every evening.      olmesartan-hydrochlorothiazide (BENICAR HCT) 20-12.5 MG tablet Take 1 tablet by mouth daily.     omega-3 fish oil (MAXEPA) 1000 MG CAPS capsule Take 1 capsule by mouth daily.     psyllium (METAMUCIL) 58.6 % powder Take 1 packet by mouth as needed.     benzonatate (TESSALON) 100 MG capsule Take 1 capsule (100 mg total) by mouth 3 (three) times daily as needed for up to 9 doses for cough. 9 capsule 0   aspirin EC 81 MG EC tablet Take 1 tablet (81 mg total) by mouth daily.     No facility-administered medications prior to visit.   Final Medications at End of Visit    Current Meds  Medication Sig   Cyanocobalamin (VITAMIN B-12 PO) Take 1 tablet by mouth every evening.    ferrous sulfate 325 (65 FE) MG tablet Take 1 tablet (325 mg total) by mouth 2 (two) times daily with a meal.   hydrALAZINE (APRESOLINE) 25 MG tablet TAKE 1 TABLET(25 MG) BY MOUTH IN THE MORNING AND AT BEDTIME   isosorbide dinitrate (ISORDIL) 30 MG tablet TAKE 1 TABLET(30 MG) BY MOUTH TWICE DAILY   lovastatin (MEVACOR) 40 MG tablet Take 40 mg by mouth 2 (two) times a week. Mon and Tues   Multiple Vitamin (MULTIVITAMIN WITH MINERALS) TABS tablet Take 1 tablet by mouth every evening.    Multiple Vitamins-Minerals (VISION VITAMINS PO) Take 1 tablet by mouth every evening.     olmesartan-hydrochlorothiazide (BENICAR HCT) 20-12.5 MG tablet Take 1 tablet by mouth daily.   omega-3 fish oil (MAXEPA) 1000 MG CAPS capsule Take 1 capsule by mouth daily.   psyllium (METAMUCIL) 58.6 % powder Take 1 packet by mouth as needed.   Radiology:  No results found.  Cardiac Studies:   Coronary angiogram 08/12/2016: 60% to 70% smooth mid RCA stenosis, no change from 2007. Severe calcification of the AV annulus and AV leaflets. Severe mitral annular calcification.  Echocardiogram 05/13/2019:  Normal LV systolic function with EF 56%. Mild concentric hypertrophy of  the left ventricle. Left ventricle cavity is normal in size. Normal global  wall motion. Doppler evidence of grade I (impaired) diastolic dysfunction,  normal LAP.  Left atrial cavity is mildly dilated.  Bioprosthetic aortic valve.  Mild aortic valve stenosis. Aortic valve mean  gradient of 12 mmHg, Vmax of 2.4  m/s. Calculated aortic valve area by  continuity equation is 1.2 cm. Moderate (Grade II) paravalvular aortic  regurgitation.  Mild to moderate mitral regurgitation.  Mild pulmonic regurgitation.  IVC is dilated with a respiratory response of >50%. Estimated RA pressure  8 mmHg.  No significant change compared to previous study on 10/08/2018.   PCV ECHOCARDIOGRAM COMPLETE 05/24/2021 Left ventricle cavity is normal in size. Moderate concentric hypertrophy of the left ventricle. Normal global wall motion. Normal LV systolic function with EF 56%. Indeterminate diastolic filling pattern. Well seated Edwards Sapien 3 THV (size 29 mm) valve.  Moderate (Grade II) paravalvular aortic regurgitation. Aortic valve mean gradient  of 16 mmHg, Vmax of 2.4  m/s, likely higher due to aortic regurgitation. Moderate (Grade II) mitral regurgitation. Estimated right atrial pressure 8 mmHg. No significant change compared to previous study on 05/12/2021.   Echocardiogram 05/20/2022:  Normal LV systolic function with EF 52%. Left  ventricle cavity is normal  in size. Mild concentric hypertrophy of the left ventricle. Normal global  wall motion. Doppler evidence of grade I (impaired) diastolic dysfunction,  normal LAP.  Well seated Edwards Sapien 3 THV (size 29 mm) valve. Moderate (Grade II)  paravalvular aortic regurgitation. Aortic valve mean gradient of 10 mmHg.  Mild (Grade I) mitral regurgitation.  Mild tricuspid regurgitation. Estimated pulmonary artery systolic pressure  31 mmHg.  Proximal ascending aortic aneurysm 4 cm-not appreciated on previous study  on 05/24/2021, but has been documented previously.   EKG   06/02/2022: Sinus Rhythm with first degree A-V block. PVCs present. No significant change compared to prior   01/18/2021: Sinus bradycardia with first-degree AV block at a rate of 55 bpm.  Normal axis.  IVCD.   01/11/2021: Marked sinus bradycardia with first-degree AV block at a rate of 45 bpm.  IVCD.  Borderline criteria for LVH.  EKG 04/06/2020: Sinus rhythm with first-degree block at rate of 64 bpm, left atrial enlargement, normal axis.  IVCD, borderline criteria for LVH.  Normal QT interval, no evidence of ischemia.  No significant change from 03/25/2019.  Assessment     ICD-10-CM   1. Essential hypertension  I10 EKG 12-Lead    2. S/P TAVR (transcatheter aortic valve replacement)  Z95.2     3. Aortic root dilatation (HCC)  I77.810       No orders of the defined types were placed in this encounter.  Medications Discontinued During This Encounter  Medication Reason   aspirin EC 81 MG EC tablet Patient Preference     Recommendations:   Marvin Houston  is a 88 y.o.  aortic stenosis that is S/P TAVR on 09/09/2016 and mild CAD, moderate paravalvular regurgitation, aortic root dilation at 4.9 cm.  Patient has previously been unable to tolerate beta-blocker therapy due to symptomatic sinus bradycardia.  Essential hypertension Continue current meds Encourage low-sodium diet, less than 2000 mg  daily.   S/P TAVR (transcatheter aortic valve replacement) Echo unchanged from last year   Aortic root dilatation [facility]) Not noted on echo done this year 05/2022   Follow-up in 1 year, sooner if needed, for CAD, MR, results of echocardiogram.   Floydene Flock, DO, [facility] 06/02/2022, 11:29 AM Office: [phone]

## 2022-07-14 DIAGNOSIS — N1832 Chronic kidney disease, stage 3b: Secondary | ICD-10-CM | POA: Diagnosis not present

## 2022-07-21 ENCOUNTER — Other Ambulatory Visit: Payer: Self-pay

## 2022-07-21 DIAGNOSIS — I10 Essential (primary) hypertension: Secondary | ICD-10-CM

## 2022-07-21 MED ORDER — ISOSORBIDE DINITRATE 30 MG PO TABS: ORAL_TABLET | ORAL | 0 refills | Status: DC

## 2022-07-21 MED ORDER — HYDRALAZINE HCL 25 MG PO TABS: ORAL_TABLET | ORAL | 0 refills | Status: DC

## 2022-07-22 DIAGNOSIS — I340 Nonrheumatic mitral (valve) insufficiency: Secondary | ICD-10-CM | POA: Diagnosis not present

## 2022-07-22 DIAGNOSIS — D509 Iron deficiency anemia, unspecified: Secondary | ICD-10-CM | POA: Diagnosis not present

## 2022-07-22 DIAGNOSIS — N1832 Chronic kidney disease, stage 3b: Secondary | ICD-10-CM | POA: Diagnosis not present

## 2022-07-22 DIAGNOSIS — Z952 Presence of prosthetic heart valve: Secondary | ICD-10-CM | POA: Diagnosis not present

## 2022-07-22 DIAGNOSIS — I129 Hypertensive chronic kidney disease with stage 1 through stage 4 chronic kidney disease, or unspecified chronic kidney disease: Secondary | ICD-10-CM | POA: Diagnosis not present

## 2022-07-23 ENCOUNTER — Other Ambulatory Visit: Payer: Self-pay

## 2022-07-23 DIAGNOSIS — D225 Melanocytic nevi of trunk: Secondary | ICD-10-CM | POA: Diagnosis not present

## 2022-07-23 DIAGNOSIS — L578 Other skin changes due to chronic exposure to nonionizing radiation: Secondary | ICD-10-CM | POA: Diagnosis not present

## 2022-07-23 DIAGNOSIS — D2262 Melanocytic nevi of left upper limb, including shoulder: Secondary | ICD-10-CM | POA: Diagnosis not present

## 2022-07-23 DIAGNOSIS — D2261 Melanocytic nevi of right upper limb, including shoulder: Secondary | ICD-10-CM | POA: Diagnosis not present

## 2022-07-23 DIAGNOSIS — L570 Actinic keratosis: Secondary | ICD-10-CM | POA: Diagnosis not present

## 2022-07-23 DIAGNOSIS — L820 Inflamed seborrheic keratosis: Secondary | ICD-10-CM | POA: Diagnosis not present

## 2022-07-23 DIAGNOSIS — L538 Other specified erythematous conditions: Secondary | ICD-10-CM | POA: Diagnosis not present

## 2022-07-23 DIAGNOSIS — L918 Other hypertrophic disorders of the skin: Secondary | ICD-10-CM | POA: Diagnosis not present

## 2022-07-23 DIAGNOSIS — L821 Other seborrheic keratosis: Secondary | ICD-10-CM | POA: Diagnosis not present

## 2022-07-23 DIAGNOSIS — I10 Essential (primary) hypertension: Secondary | ICD-10-CM

## 2022-07-23 DIAGNOSIS — D2239 Melanocytic nevi of other parts of face: Secondary | ICD-10-CM | POA: Diagnosis not present

## 2022-07-23 MED ORDER — HYDRALAZINE HCL 25 MG PO TABS: ORAL_TABLET | ORAL | 3 refills | Status: DC

## 2022-07-23 MED ORDER — ISOSORBIDE DINITRATE 30 MG PO TABS: ORAL_TABLET | ORAL | 3 refills | Status: DC

## 2022-08-08 DIAGNOSIS — D509 Iron deficiency anemia, unspecified: Secondary | ICD-10-CM | POA: Diagnosis not present

## 2022-08-08 DIAGNOSIS — E7800 Pure hypercholesterolemia, unspecified: Secondary | ICD-10-CM | POA: Diagnosis not present

## 2022-08-08 DIAGNOSIS — E119 Type 2 diabetes mellitus without complications: Secondary | ICD-10-CM | POA: Diagnosis not present

## 2022-08-14 ENCOUNTER — Encounter: Payer: Self-pay | Admitting: Pharmacist

## 2022-08-14 NOTE — Progress Notes (Signed)
[facility])                                            Head of the [place] Team    08/14/2022  Marvin Houston 07/28/1934 [id number]   Patient identified on the [facility] non adherence report for Lovastatin. On review of chart, patient is prescribed to take 1 tablet PO twice weekly, quantity prescribed is 90 tablets. Placed telephone call to primary care provider's office at [facility] requesting a new prescription be issued for #26 tablets with 3 refills.   Loretha Brasil, PharmD [facility] Pharmacist Office: [phone]

## 2022-08-15 DIAGNOSIS — Z23 Encounter for immunization: Secondary | ICD-10-CM | POA: Diagnosis not present

## 2022-08-15 DIAGNOSIS — D509 Iron deficiency anemia, unspecified: Secondary | ICD-10-CM | POA: Diagnosis not present

## 2022-08-15 DIAGNOSIS — K5520 Angiodysplasia of colon without hemorrhage: Secondary | ICD-10-CM | POA: Diagnosis not present

## 2022-08-15 DIAGNOSIS — E1122 Type 2 diabetes mellitus with diabetic chronic kidney disease: Secondary | ICD-10-CM | POA: Diagnosis not present

## 2022-08-15 DIAGNOSIS — N1832 Chronic kidney disease, stage 3b: Secondary | ICD-10-CM | POA: Diagnosis not present

## 2022-08-15 DIAGNOSIS — I2510 Atherosclerotic heart disease of native coronary artery without angina pectoris: Secondary | ICD-10-CM | POA: Diagnosis not present

## 2022-10-18 ENCOUNTER — Other Ambulatory Visit: Payer: Self-pay | Admitting: Internal Medicine

## 2022-10-18 DIAGNOSIS — I10 Essential (primary) hypertension: Secondary | ICD-10-CM

## 2022-10-21 IMAGING — DX DG CHEST 2V
2 series · 2 of 2 positions shown · non-contrast
Comparison: Chest radiograph 09/10/2016

CLINICAL DATA: Cough chest and head congestion

EXAM:
CHEST - 2 VIEW

[chest pa]
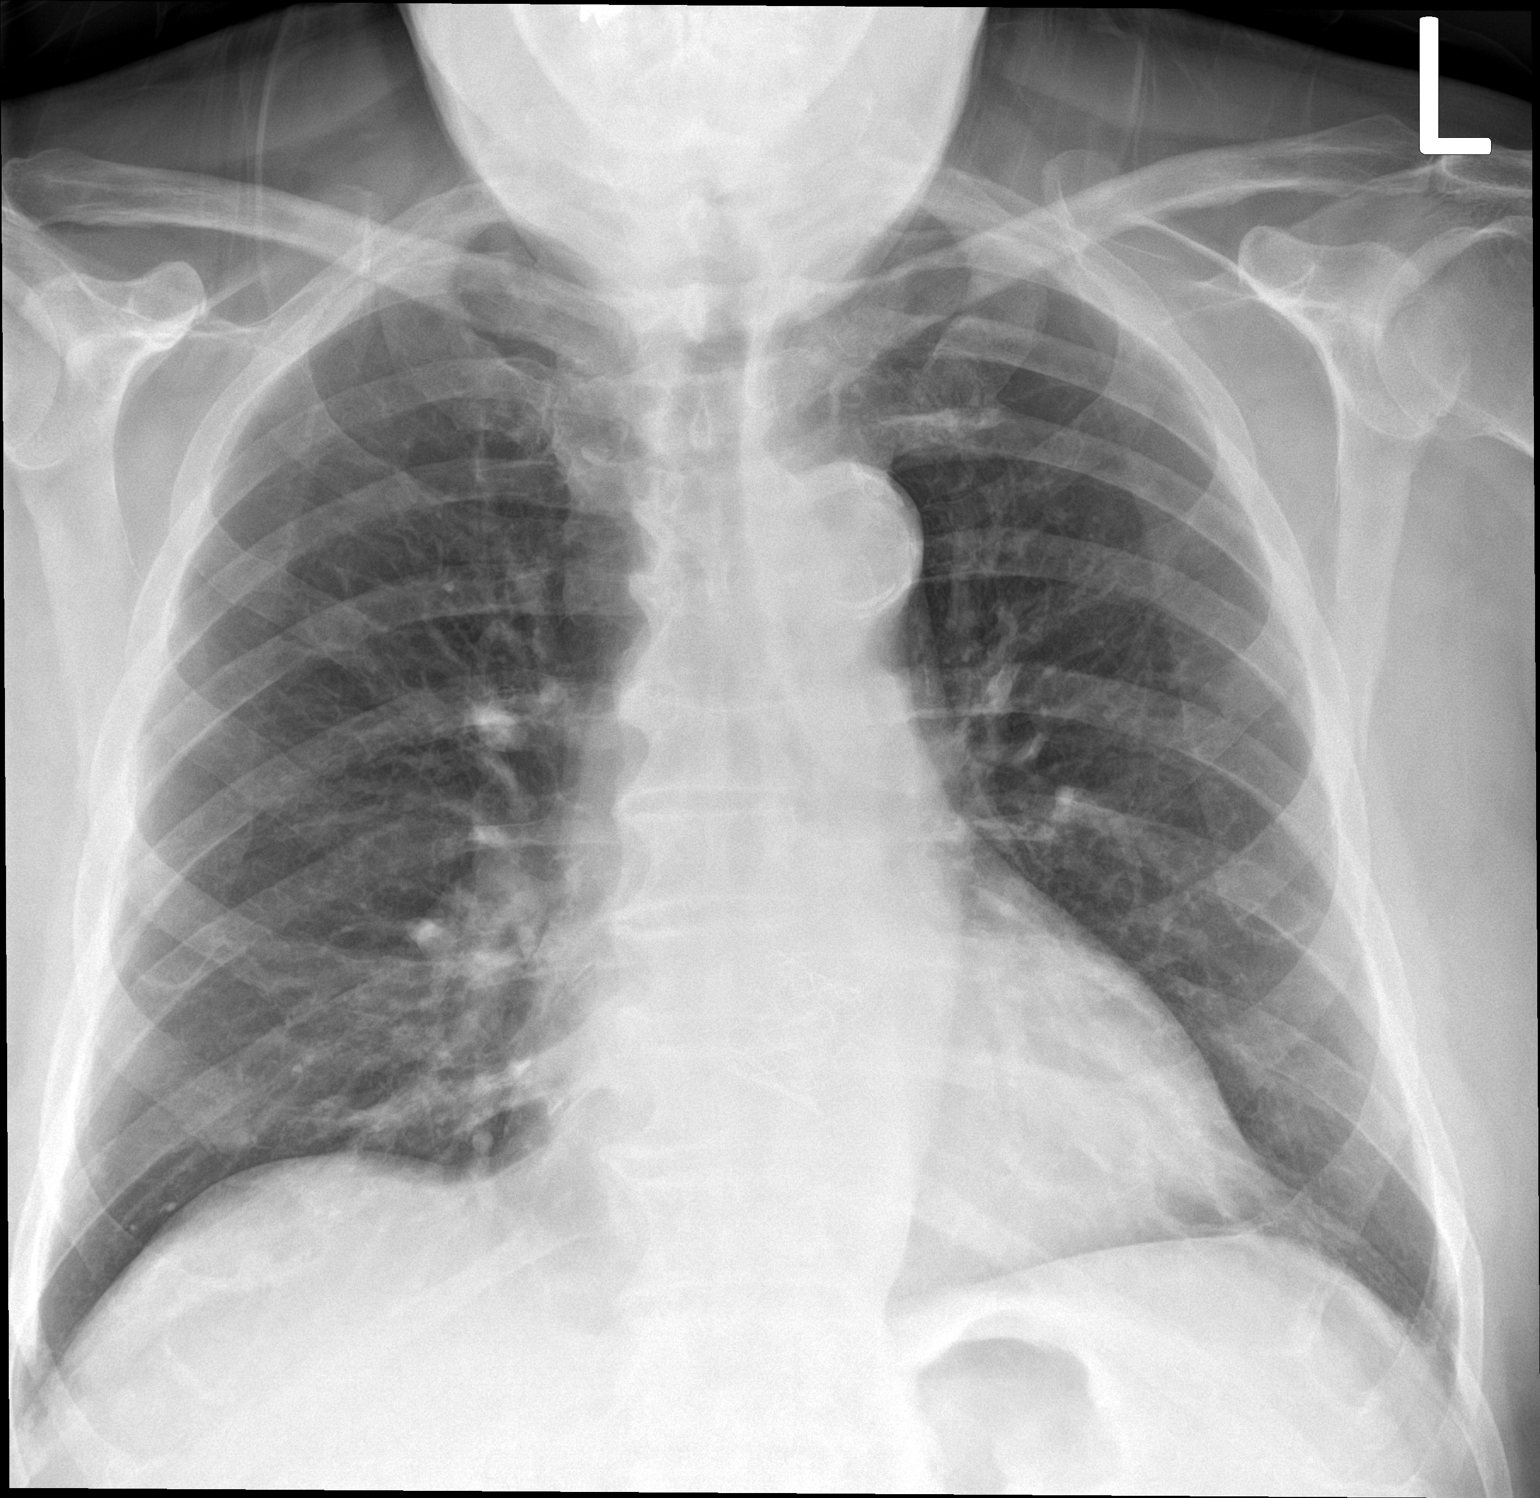

[chest lat]
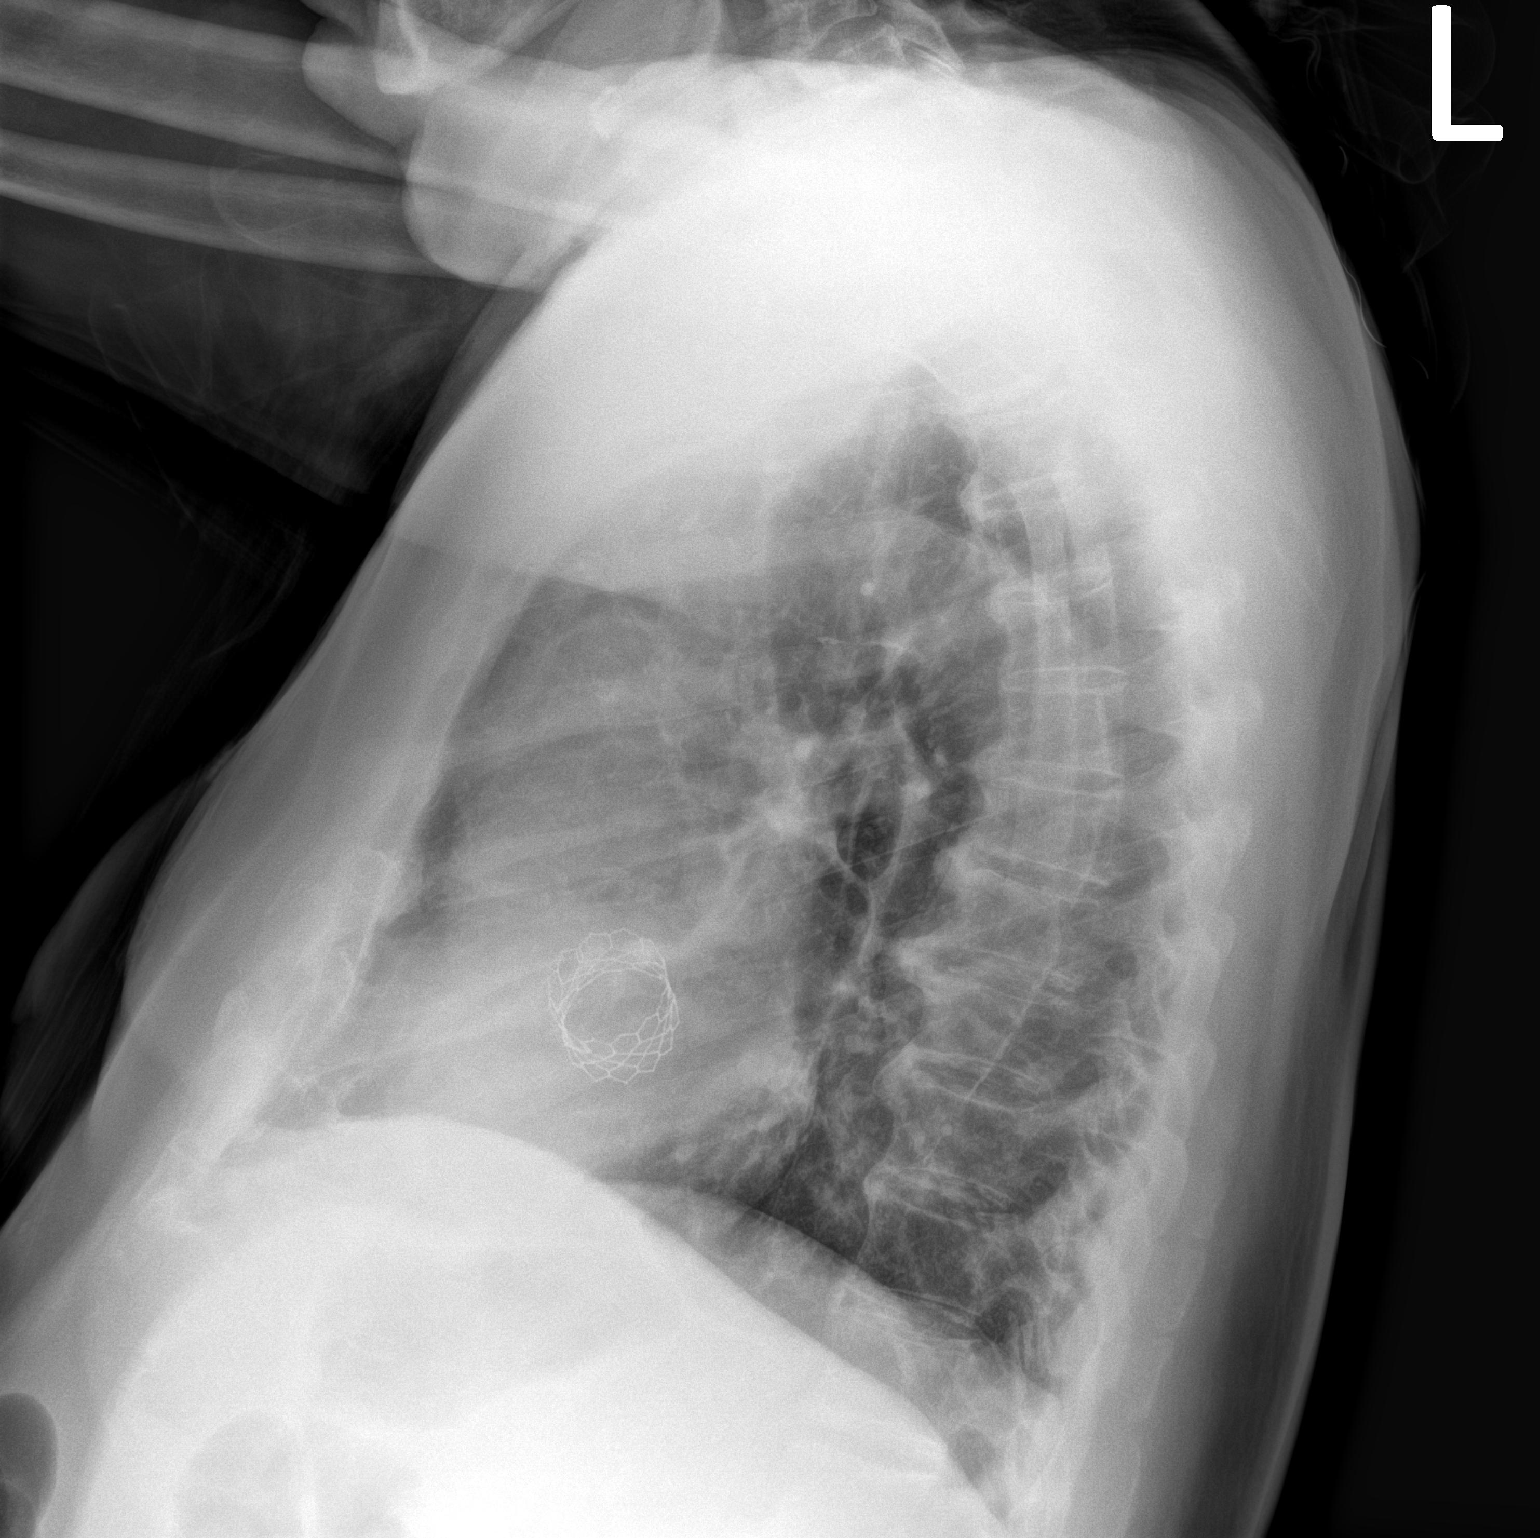

[2 of 2 positions shown; findings below may reference images not displayed]

FINDINGS: The cardiomediastinal silhouette is normal. An aortic valve
prosthesis is again noted.

There is no focal consolidation or pulmonary edema. There is no
pleural effusion or pneumothorax.

There is no acute osseous abnormality.
IMPRESSION: No radiographic evidence of acute cardiopulmonary process.

## 2022-11-05 DIAGNOSIS — R2681 Unsteadiness on feet: Secondary | ICD-10-CM | POA: Diagnosis not present

## 2022-11-10 ENCOUNTER — Other Ambulatory Visit: Payer: Self-pay | Admitting: Internal Medicine

## 2022-11-10 DIAGNOSIS — I10 Essential (primary) hypertension: Secondary | ICD-10-CM

## 2022-11-10 DIAGNOSIS — R2681 Unsteadiness on feet: Secondary | ICD-10-CM | POA: Diagnosis not present

## 2022-11-12 DIAGNOSIS — R2681 Unsteadiness on feet: Secondary | ICD-10-CM | POA: Diagnosis not present

## 2022-11-17 DIAGNOSIS — R2681 Unsteadiness on feet: Secondary | ICD-10-CM | POA: Diagnosis not present

## 2022-11-19 DIAGNOSIS — R2681 Unsteadiness on feet: Secondary | ICD-10-CM | POA: Diagnosis not present

## 2022-11-26 DIAGNOSIS — R2681 Unsteadiness on feet: Secondary | ICD-10-CM | POA: Diagnosis not present

## 2022-11-28 DIAGNOSIS — D485 Neoplasm of uncertain behavior of skin: Secondary | ICD-10-CM | POA: Diagnosis not present

## 2022-11-28 DIAGNOSIS — L578 Other skin changes due to chronic exposure to nonionizing radiation: Secondary | ICD-10-CM | POA: Diagnosis not present

## 2022-11-28 DIAGNOSIS — Z09 Encounter for follow-up examination after completed treatment for conditions other than malignant neoplasm: Secondary | ICD-10-CM | POA: Diagnosis not present

## 2022-11-28 DIAGNOSIS — L820 Inflamed seborrheic keratosis: Secondary | ICD-10-CM | POA: Diagnosis not present

## 2022-11-28 DIAGNOSIS — L814 Other melanin hyperpigmentation: Secondary | ICD-10-CM | POA: Diagnosis not present

## 2022-12-01 DIAGNOSIS — R2681 Unsteadiness on feet: Secondary | ICD-10-CM | POA: Diagnosis not present

## 2022-12-04 DIAGNOSIS — R2681 Unsteadiness on feet: Secondary | ICD-10-CM | POA: Diagnosis not present

## 2022-12-07 DIAGNOSIS — R2681 Unsteadiness on feet: Secondary | ICD-10-CM | POA: Diagnosis not present

## 2022-12-08 DIAGNOSIS — R2681 Unsteadiness on feet: Secondary | ICD-10-CM | POA: Diagnosis not present

## 2022-12-15 DIAGNOSIS — R2681 Unsteadiness on feet: Secondary | ICD-10-CM | POA: Diagnosis not present

## 2022-12-19 DIAGNOSIS — H2511 Age-related nuclear cataract, right eye: Secondary | ICD-10-CM | POA: Diagnosis not present

## 2022-12-19 DIAGNOSIS — E119 Type 2 diabetes mellitus without complications: Secondary | ICD-10-CM | POA: Diagnosis not present

## 2022-12-22 DIAGNOSIS — R2681 Unsteadiness on feet: Secondary | ICD-10-CM | POA: Diagnosis not present

## 2022-12-29 DIAGNOSIS — R2681 Unsteadiness on feet: Secondary | ICD-10-CM | POA: Diagnosis not present

## 2022-12-30 DIAGNOSIS — H6123 Impacted cerumen, bilateral: Secondary | ICD-10-CM | POA: Diagnosis not present

## 2023-01-02 ENCOUNTER — Other Ambulatory Visit (HOSPITAL_COMMUNITY): Payer: Self-pay | Admitting: Gastroenterology

## 2023-01-02 DIAGNOSIS — R1310 Dysphagia, unspecified: Secondary | ICD-10-CM | POA: Diagnosis not present

## 2023-01-07 DIAGNOSIS — R2681 Unsteadiness on feet: Secondary | ICD-10-CM | POA: Diagnosis not present

## 2023-01-19 ENCOUNTER — Ambulatory Visit (HOSPITAL_COMMUNITY)
Admission: RE | Admit: 2023-01-19 | Discharge: 2023-01-19 | Disposition: A | Discharge status: Home / Self Care | Payer: Medicare Other | Source: Ambulatory Visit | Attending: Gastroenterology | Admitting: Gastroenterology

## 2023-01-19 DIAGNOSIS — R1310 Dysphagia, unspecified: Secondary | ICD-10-CM | POA: Insufficient documentation

## 2023-01-19 DIAGNOSIS — K224 Dyskinesia of esophagus: Secondary | ICD-10-CM | POA: Diagnosis not present

## 2023-01-28 ENCOUNTER — Encounter: Payer: Self-pay | Admitting: Cardiology

## 2023-02-09 DIAGNOSIS — R2681 Unsteadiness on feet: Secondary | ICD-10-CM | POA: Diagnosis not present

## 2023-02-16 DIAGNOSIS — R2681 Unsteadiness on feet: Secondary | ICD-10-CM | POA: Diagnosis not present

## 2023-02-20 DIAGNOSIS — D509 Iron deficiency anemia, unspecified: Secondary | ICD-10-CM | POA: Diagnosis not present

## 2023-02-20 DIAGNOSIS — I10 Essential (primary) hypertension: Secondary | ICD-10-CM | POA: Diagnosis not present

## 2023-02-20 DIAGNOSIS — E7800 Pure hypercholesterolemia, unspecified: Secondary | ICD-10-CM | POA: Diagnosis not present

## 2023-02-20 DIAGNOSIS — R739 Hyperglycemia, unspecified: Secondary | ICD-10-CM | POA: Diagnosis not present

## 2023-02-23 DIAGNOSIS — R2681 Unsteadiness on feet: Secondary | ICD-10-CM | POA: Diagnosis not present

## 2023-02-25 DIAGNOSIS — I129 Hypertensive chronic kidney disease with stage 1 through stage 4 chronic kidney disease, or unspecified chronic kidney disease: Secondary | ICD-10-CM | POA: Diagnosis not present

## 2023-02-25 DIAGNOSIS — N1832 Chronic kidney disease, stage 3b: Secondary | ICD-10-CM | POA: Diagnosis not present

## 2023-02-25 DIAGNOSIS — I340 Nonrheumatic mitral (valve) insufficiency: Secondary | ICD-10-CM | POA: Diagnosis not present

## 2023-02-25 DIAGNOSIS — Z952 Presence of prosthetic heart valve: Secondary | ICD-10-CM | POA: Diagnosis not present

## 2023-02-25 DIAGNOSIS — R1310 Dysphagia, unspecified: Secondary | ICD-10-CM | POA: Diagnosis not present

## 2023-02-25 DIAGNOSIS — D509 Iron deficiency anemia, unspecified: Secondary | ICD-10-CM | POA: Diagnosis not present

## 2023-02-27 DIAGNOSIS — K5520 Angiodysplasia of colon without hemorrhage: Secondary | ICD-10-CM | POA: Diagnosis not present

## 2023-02-27 DIAGNOSIS — D509 Iron deficiency anemia, unspecified: Secondary | ICD-10-CM | POA: Diagnosis not present

## 2023-02-27 DIAGNOSIS — I10 Essential (primary) hypertension: Secondary | ICD-10-CM | POA: Diagnosis not present

## 2023-02-27 DIAGNOSIS — Z952 Presence of prosthetic heart valve: Secondary | ICD-10-CM | POA: Diagnosis not present

## 2023-02-27 DIAGNOSIS — E119 Type 2 diabetes mellitus without complications: Secondary | ICD-10-CM | POA: Diagnosis not present

## 2023-02-27 DIAGNOSIS — E785 Hyperlipidemia, unspecified: Secondary | ICD-10-CM | POA: Diagnosis not present

## 2023-02-27 DIAGNOSIS — Z0000 Encounter for general adult medical examination without abnormal findings: Secondary | ICD-10-CM | POA: Diagnosis not present

## 2023-02-27 DIAGNOSIS — I2510 Atherosclerotic heart disease of native coronary artery without angina pectoris: Secondary | ICD-10-CM | POA: Diagnosis not present

## 2023-02-27 DIAGNOSIS — N1832 Chronic kidney disease, stage 3b: Secondary | ICD-10-CM | POA: Diagnosis not present

## 2023-03-02 DIAGNOSIS — R2681 Unsteadiness on feet: Secondary | ICD-10-CM | POA: Diagnosis not present

## 2023-03-04 ENCOUNTER — Encounter: Payer: Self-pay | Admitting: Cardiology

## 2023-03-04 NOTE — Progress Notes (Signed)
Labs 02/20/2023:  A1c 5.7%.  Total cholesterol 109, triglycerides 95, HDL 36, LDL 55.  Hb 10.7/HCT 31.4, platelets 153.  Normal indicis.  Serum glucose 171 mg, BUN 23, creatinine 1.77, EGFR 37 mL, sodium 140, potassium 4.3, LFTs normal.

## 2023-03-09 DIAGNOSIS — R2681 Unsteadiness on feet: Secondary | ICD-10-CM | POA: Diagnosis not present

## 2023-03-11 DIAGNOSIS — R1310 Dysphagia, unspecified: Secondary | ICD-10-CM | POA: Diagnosis not present

## 2023-03-11 DIAGNOSIS — K2970 Gastritis, unspecified, without bleeding: Secondary | ICD-10-CM | POA: Diagnosis not present

## 2023-03-11 DIAGNOSIS — K222 Esophageal obstruction: Secondary | ICD-10-CM | POA: Diagnosis not present

## 2023-03-11 DIAGNOSIS — K449 Diaphragmatic hernia without obstruction or gangrene: Secondary | ICD-10-CM | POA: Diagnosis not present

## 2023-03-17 DIAGNOSIS — N1832 Chronic kidney disease, stage 3b: Secondary | ICD-10-CM | POA: Diagnosis not present

## 2023-03-17 DIAGNOSIS — Z952 Presence of prosthetic heart valve: Secondary | ICD-10-CM | POA: Diagnosis not present

## 2023-03-17 DIAGNOSIS — I2510 Atherosclerotic heart disease of native coronary artery without angina pectoris: Secondary | ICD-10-CM | POA: Diagnosis not present

## 2023-03-17 DIAGNOSIS — E785 Hyperlipidemia, unspecified: Secondary | ICD-10-CM | POA: Diagnosis not present

## 2023-03-17 DIAGNOSIS — I129 Hypertensive chronic kidney disease with stage 1 through stage 4 chronic kidney disease, or unspecified chronic kidney disease: Secondary | ICD-10-CM | POA: Diagnosis not present

## 2023-03-23 DIAGNOSIS — R2681 Unsteadiness on feet: Secondary | ICD-10-CM | POA: Diagnosis not present

## 2023-03-31 DIAGNOSIS — R2681 Unsteadiness on feet: Secondary | ICD-10-CM | POA: Diagnosis not present

## 2023-04-06 DIAGNOSIS — R2681 Unsteadiness on feet: Secondary | ICD-10-CM | POA: Diagnosis not present

## 2023-04-20 DIAGNOSIS — R2681 Unsteadiness on feet: Secondary | ICD-10-CM | POA: Diagnosis not present

## 2023-04-29 ENCOUNTER — Other Ambulatory Visit: Payer: Self-pay | Admitting: Cardiology

## 2023-04-29 DIAGNOSIS — Z952 Presence of prosthetic heart valve: Secondary | ICD-10-CM

## 2023-04-29 DIAGNOSIS — I77810 Thoracic aortic ectasia: Secondary | ICD-10-CM

## 2023-05-04 DIAGNOSIS — R2681 Unsteadiness on feet: Secondary | ICD-10-CM | POA: Diagnosis not present

## 2023-05-06 ENCOUNTER — Other Ambulatory Visit: Payer: Self-pay | Admitting: Internal Medicine

## 2023-05-06 ENCOUNTER — Other Ambulatory Visit: Payer: Self-pay | Admitting: Cardiology

## 2023-05-06 DIAGNOSIS — I10 Essential (primary) hypertension: Secondary | ICD-10-CM

## 2023-05-14 ENCOUNTER — Other Ambulatory Visit: Payer: Self-pay | Admitting: Internal Medicine

## 2023-05-14 DIAGNOSIS — I10 Essential (primary) hypertension: Secondary | ICD-10-CM

## 2023-05-15 ENCOUNTER — Other Ambulatory Visit: Payer: Medicare Other

## 2023-05-28 DIAGNOSIS — R2681 Unsteadiness on feet: Secondary | ICD-10-CM | POA: Diagnosis not present

## 2023-05-29 ENCOUNTER — Ambulatory Visit (HOSPITAL_COMMUNITY): Payer: Medicare Other | Attending: Cardiology

## 2023-05-29 DIAGNOSIS — Z952 Presence of prosthetic heart valve: Secondary | ICD-10-CM | POA: Diagnosis not present

## 2023-05-29 DIAGNOSIS — I77810 Thoracic aortic ectasia: Secondary | ICD-10-CM | POA: Diagnosis not present

## 2023-05-29 LAB — ECHOCARDIOGRAM COMPLETE
AR max vel: 2.42 cm2
AV Area VTI: 2.63 cm2
AV Area mean vel: 2.34 cm2
AV Mean grad: 12.4 mm[Hg]
AV Peak grad: 19.6 mm[Hg]
Ao pk vel: 2.21 m/s
Area-P 1/2: 3.65 cm2
S' Lateral: 3.8 cm

## 2023-05-29 NOTE — Progress Notes (Signed)
Echocardiogram 05/29/2023:   1. Left ventricular ejection fraction, by estimation, is 60 to 65%. The left ventricle has normal function. The left ventricle has no regional wall motion abnormalities. Left ventricular diastolic parameters are consistent with Grade I diastolic dysfunction (impaired relaxation).  2. Right ventricular systolic function is normal. The right ventricular size is normal.  3. The mitral valve is normal in structure. Trivial mitral valve regurgitation. No evidence of mitral stenosis.  4. Stable TAVR valve with mild to moderate perivalvular leak. The aortic valve has been repaired/replaced. Aortic valve regurgitation is mild to moderate. No aortic stenosis is present. There is a 29 mm Sapien prosthetic (TAVR) valve present in the aortic position. Procedure Date: 08/2016. Echo findings are consistent with normal structure and function of the aortic valve prosthesis. Aortic valve area, by VTI measures 2.63 cm. Aortic valve mean gradient measures 12.4 mmHg. Aortic valve Vmax measures 2.21 m/s.  5. Aortic dilatation noted. There is mild dilatation of the ascending aorta, measuring 42 mm.  6. The inferior vena cava is normal in size with greater than 50% respiratory variability, suggesting right atrial pressure of 3 mmHg. 7. No significant change since 05/20/2022

## 2023-06-03 ENCOUNTER — Ambulatory Visit: Payer: Medicare Other | Admitting: Cardiology

## 2023-06-03 DIAGNOSIS — R2681 Unsteadiness on feet: Secondary | ICD-10-CM | POA: Diagnosis not present

## 2023-06-03 NOTE — Progress Notes (Signed)
Left message on his phone.

## 2023-06-05 DIAGNOSIS — D225 Melanocytic nevi of trunk: Secondary | ICD-10-CM | POA: Diagnosis not present

## 2023-06-05 DIAGNOSIS — L570 Actinic keratosis: Secondary | ICD-10-CM | POA: Diagnosis not present

## 2023-06-05 DIAGNOSIS — L821 Other seborrheic keratosis: Secondary | ICD-10-CM | POA: Diagnosis not present

## 2023-06-05 DIAGNOSIS — L814 Other melanin hyperpigmentation: Secondary | ICD-10-CM | POA: Diagnosis not present

## 2023-06-05 DIAGNOSIS — L820 Inflamed seborrheic keratosis: Secondary | ICD-10-CM | POA: Diagnosis not present

## 2023-06-05 DIAGNOSIS — D485 Neoplasm of uncertain behavior of skin: Secondary | ICD-10-CM | POA: Diagnosis not present

## 2023-06-05 DIAGNOSIS — L538 Other specified erythematous conditions: Secondary | ICD-10-CM | POA: Diagnosis not present

## 2023-06-10 DIAGNOSIS — R2681 Unsteadiness on feet: Secondary | ICD-10-CM | POA: Diagnosis not present

## 2023-06-15 DIAGNOSIS — N1832 Chronic kidney disease, stage 3b: Secondary | ICD-10-CM | POA: Diagnosis not present

## 2023-06-18 DIAGNOSIS — R2681 Unsteadiness on feet: Secondary | ICD-10-CM | POA: Diagnosis not present

## 2023-06-23 DIAGNOSIS — E785 Hyperlipidemia, unspecified: Secondary | ICD-10-CM | POA: Diagnosis not present

## 2023-06-23 DIAGNOSIS — I129 Hypertensive chronic kidney disease with stage 1 through stage 4 chronic kidney disease, or unspecified chronic kidney disease: Secondary | ICD-10-CM | POA: Diagnosis not present

## 2023-06-23 DIAGNOSIS — Z952 Presence of prosthetic heart valve: Secondary | ICD-10-CM | POA: Diagnosis not present

## 2023-06-23 DIAGNOSIS — E1122 Type 2 diabetes mellitus with diabetic chronic kidney disease: Secondary | ICD-10-CM | POA: Diagnosis not present

## 2023-06-23 DIAGNOSIS — N1832 Chronic kidney disease, stage 3b: Secondary | ICD-10-CM | POA: Diagnosis not present

## 2023-06-30 DIAGNOSIS — R2681 Unsteadiness on feet: Secondary | ICD-10-CM | POA: Diagnosis not present

## 2023-07-07 DIAGNOSIS — N1832 Chronic kidney disease, stage 3b: Secondary | ICD-10-CM | POA: Diagnosis not present

## 2023-07-16 ENCOUNTER — Ambulatory Visit: Payer: Medicare Other | Attending: Internal Medicine | Admitting: Cardiology

## 2023-07-16 ENCOUNTER — Encounter: Payer: Self-pay | Admitting: Cardiology

## 2023-07-16 VITALS — BP 124/66 | HR 88 | Resp 16 | Ht 70.0 in | Wt 209.0 lb

## 2023-07-16 DIAGNOSIS — I77810 Thoracic aortic ectasia: Secondary | ICD-10-CM

## 2023-07-16 DIAGNOSIS — T8209XD Other mechanical complication of heart valve prosthesis, subsequent encounter: Secondary | ICD-10-CM

## 2023-07-16 DIAGNOSIS — I10 Essential (primary) hypertension: Secondary | ICD-10-CM

## 2023-07-16 DIAGNOSIS — Z952 Presence of prosthetic heart valve: Secondary | ICD-10-CM

## 2023-07-16 DIAGNOSIS — T8209XS Other mechanical complication of heart valve prosthesis, sequela: Secondary | ICD-10-CM

## 2023-07-16 NOTE — Progress Notes (Signed)
Cardiology Office Note:  .   Date:  07/18/2023  ID:  Marvin Houston, DOB Feb 02, 1934, MRN [id number] PCP: Fatima Sanger, FNP  [facility] Providers Cardiologist:  Yates Decamp, MD   History of Present Illness: .   Marvin Houston is a 89 y.o.  aortic stenosis that is S/P TAVR on 09/09/2016 and mild CAD, moderate paravalvular regurgitation, mild aortic root dilation at 4.1 cm.  Patient has previously been unable to tolerate beta-blocker therapy due to symptomatic sinus bradycardia.  His past medical history significant for primary hypertension, Type 2 diabetes mellitus with stage IIIb chronic kidney disease, chronic iron deficiency anemia   He has been doing well since his last office visit. Denies chest pain, shortness of breath, palpitations, diaphoresis, syncope.    Discussed the use of AI scribe software for clinical note transcription with the patient, who gave verbal consent to proceed.  History of Present Illness   The patient, with a history of diabetes and stage 3B chronic kidney disease, presents for a routine follow-up. He reports feeling a loss of energy after starting Farxiga, a medication prescribed for both his diabetes and kidney disease. He has been taking the medication every other day, as advised by the prescribing physician, but has not noticed an improvement in his energy levels. He denies any breathing difficulties or leg swelling. He expresses frustration with the number of medications he is currently taking but acknowledges the necessity given his age and health conditions. He also mentions a recent addition to his medication regimen from his nephrologist.  The patient also discusses his wife's recent hospitalization due to chest pain and elevated blood pressure. He expresses concern for her health and inquire about the possibility of the doctor taking her on as a patient.      Review of Systems  Cardiovascular:  Negative for chest pain, dyspnea on exertion and leg  swelling.   Labs   No results found for: "CHOL", "HDL", "LDLCALC", "LDLDIRECT", "TRIG", "CHOLHDL" Lab Results  Component Value Date   NA 135 07/30/2021   K 3.9 07/30/2021   CO2 25 07/30/2021   GLUCOSE 174 (H) 07/30/2021   BUN 23 07/30/2021   CREATININE 1.67 (H) 07/30/2021   CALCIUM 9.3 07/30/2021   GFRNONAA 40 (L) 07/30/2021      Latest Ref Rng & Units 07/30/2021    2:59 PM 09/11/2016    3:37 AM 09/10/2016    3:55 AM  BMP  Glucose 70 - 99 mg/dL 865  784  696   BUN 8 - 23 mg/dL 23  11  11    Creatinine 0.61 - 1.24 mg/dL 2.95  2.84  1.32   Sodium 135 - 145 mmol/L 135  142  137   Potassium 3.5 - 5.1 mmol/L 3.9  4.4  3.5   Chloride 98 - 111 mmol/L 102  111  106   CO2 22 - 32 mmol/L 25  26  24    Calcium 8.9 - 10.3 mg/dL 9.3  8.9  8.3       Latest Ref Rng & Units 09/11/2016    3:37 AM 09/10/2016    3:55 AM 09/09/2016   11:25 AM  CBC  WBC 4.0 - 10.5 K/uL 5.6  6.7  5.3   Hemoglobin 13.0 - 17.0 g/dL 9.3  8.8  9.1   Hematocrit 39.0 - 52.0 % 29.1  27.9  27.6   Platelets 150 - 400 K/uL 102  114  106    External Labs:  Labs  02/23/2023:  PSA normal at 0.9.  A1c 5.7%.  Total cholesterol 109, triglycerides 95, HDL 36, LDL 55.  Hb 10.7/HCT 31.4, platelets 153.  Normal indicis.  Serum glucose 171, BUN 23, creatinine 1.77, EGFR 37 mL, potassium 4.3, LFTs normal.  Labs 08/11/2022:  Hb 10.3/HCT 30.3, platelets 147, normal indicis.  BUN 22, creatinine 1.69, EGFR 36 mL.  Physical Exam:   VS:  BP 124/66 (BP Location: Left Arm, Patient Position: Sitting, Cuff Size: Normal)   Pulse 88   Resp 16   Ht 5\' 10"  (1.778 m)   Wt 209 lb (94.8 kg)   SpO2 98%   BMI 29.99 kg/m    Wt Readings from Last 3 Encounters:  07/16/23 209 lb (94.8 kg)  06/02/22 213 lb 4.8 oz (96.8 kg)  07/30/21 212 lb 1.3 oz (96.2 kg)     Physical Exam Neck:     Vascular: No carotid bruit or JVD.  Cardiovascular:     Rate and Rhythm: Normal rate and regular rhythm.     Pulses: Intact distal pulses.     Heart  sounds: Murmur heard.     Early systolic murmur is present with a grade of 2/6 at the upper right sternal border.     Blowing early diastolic murmur is present with a grade of 2/4 at the upper right sternal border.     No gallop.  Pulmonary:     Effort: Pulmonary effort is normal.     Breath sounds: Normal breath sounds.  Abdominal:     General: Bowel sounds are normal.     Palpations: Abdomen is soft.  Musculoskeletal:     Right lower leg: No edema.     Left lower leg: No edema.     Studies Reviewed: Marland Kitchen    ECHOCARDIOGRAM COMPLETE 05/29/2023  1. Left ventricular ejection fraction, by estimation, is 60 to 65%. The left ventricle has normal function. The left ventricle has no regional wall motion abnormalities. Left ventricular diastolic parameters are consistent with Grade I diastolic dysfunction (impaired relaxation). 2. Right ventricular systolic function is normal. The right ventricular size is normal. 3. The mitral valve is normal in structure. Trivial mitral valve regurgitation. No evidence of mitral stenosis. 4. Stable TAVR valve with mild to moderate perivalvular leak. The aortic valve has been repaired/replaced. Aortic valve regurgitation is mild to moderate. No aortic stenosis is present. There is a 29 mm Sapien prosthetic (TAVR) valve present in the aortic position. Procedure Date: 08/2016. Echo findings are consistent with normal structure and function of the aortic valve prosthesis. Aortic valve area, by VTI measures 2.63 cm. Aortic valve mean gradient measures 12.4 mmHg. Aortic valve Vmax measures 2.21 m/s. 5. Aortic dilatation noted. There is mild dilatation of the ascending aorta, measuring 42 mm. 6. The inferior vena cava is normal in size with greater than 50% respiratory variability, suggesting right atrial pressure of 3 mmHg.  EKG:    EKG Interpretation Date/Time:  Thursday July 16 2023 16:30:21 EST Ventricular Rate:  66 PR Interval:  228 QRS Duration:  110 QT  Interval:  394 QTC Calculation: 413 R Axis:   43  Text Interpretation: EKG 07/16/2023: Sinus rhythm with first-degree AV block at the rate of 66 bpm, otherwise normal EKG. Confirmed by Delrae Rend [phone]) on 07/16/2023 4:44:15 PM    06/02/2022: Sinus Rhythm with first degree A-V block. PVCs present.    Medications and allergies    No Known Allergies   Current Outpatient Medications:    Cyanocobalamin (  VITAMIN B-12 PO), Take 1 tablet by mouth every evening. , Disp: , Rfl:    dapagliflozin propanediol (FARXIGA) 10 MG TABS tablet, Take 10 mg by mouth daily., Disp: , Rfl:    ferrous sulfate 325 (65 FE) MG tablet, Take 1 tablet (325 mg total) by mouth 2 (two) times daily with a meal., Disp: 60 tablet, Rfl: 12   furosemide (LASIX) 40 MG tablet, Take 40 mg by mouth daily., Disp: , Rfl:    isosorbide dinitrate (ISORDIL) 30 MG tablet, TAKE 1 TABLET(30 MG) BY MOUTH TWICE DAILY, Disp: 180 tablet, Rfl: 3   lovastatin (MEVACOR) 40 MG tablet, Take 40 mg by mouth 2 (two) times a week. Mon and Tues, Disp: , Rfl:    Multiple Vitamin (MULTIVITAMIN WITH MINERALS) TABS tablet, Take 1 tablet by mouth every evening. , Disp: , Rfl:    Multiple Vitamins-Minerals (VISION VITAMINS PO), Take 1 tablet by mouth every evening. , Disp: , Rfl:    olmesartan-hydrochlorothiazide (BENICAR HCT) 20-12.5 MG tablet, Take 1 tablet by mouth daily., Disp: , Rfl:    omega-3 fish oil (MAXEPA) 1000 MG CAPS capsule, Take 1 capsule by mouth daily., Disp: , Rfl:    psyllium (METAMUCIL) 58.6 % powder, Take 1 packet by mouth as needed., Disp: , Rfl:    hydrALAZINE (APRESOLINE) 25 MG tablet, TAKE 1 TABLET(25 MG) BY MOUTH IN THE MORNING AND AT BEDTIME, Disp: 180 tablet, Rfl: 3   ASSESSMENT AND PLAN: .      ICD-10-CM   1. Aortic root dilatation (HCC)  I77.810 EKG 12-Lead    2. Essential hypertension  I10 hydrALAZINE (APRESOLINE) 25 MG tablet    3. S/P TAVR (transcatheter aortic valve replacement)  Z95.2     4. Prosthetic valve  dysfunction, sequela  T82.09XS      Assessment and Plan    Type 2 Diabetes Mellitus and Chronic Kidney Disease (Stage 3B)   Patient reports decreased energy after starting Farxiga, currently taking it every other day. Discussed the benefits of Farxiga for both diabetes and kidney preservation.   -Adjust Farxiga dose to half tablet daily to improve tolerance.    TAVR with prosthetic valve dysfunction with persistent mild to moderate aortic regurgitation Stable, no changes in symptoms or physical exam. Marcelline Deist also provides cardiovascular protection.   -Continue current management.   -Annual follow-up unless changes occur.   -No symptoms to suggest heart failure or change in LVEF. -Endocarditis prophylaxis recommended. -Very mild aortic root dilatation at 4.1 cm this remains stable.  Primary hypertension Blood pressure well-controlled on combination of Lasix 40 mg daily, hydralazine 25 mg twice daily, Imdur 30 mg daily and Benicar HCT 20/12.5 mg in the morning  General Health Maintenance   Discussed the importance of hygiene due to increased risk of groin infections with [place] use.   -Advised to maintain good hygiene practices.    New Patient Inquiry   Patient's wife, Graesyn Ruszczyk, recently hospitalized for stress-related cardiomyopathy. Patient inquired about transferring his care to current provider.   -Confirmed willingness to accept new patient.   -Advised patient to request transfer of care during wife's upcoming appointment with nurse practitioner.     I will see him back in a year or soon if problems.  Do not think he needs repeat echocardiogram prior to next office visit.  Signed,  Yates Decamp, MD, [facility] 07/18/2023, 5:32 AM [facility] [address] [place], [place] [address] Phone: [phone]. Fax:  [phone]

## 2023-07-16 NOTE — Patient Instructions (Signed)

## 2023-07-18 ENCOUNTER — Encounter: Payer: Self-pay | Admitting: Cardiology

## 2023-07-18 MED ORDER — HYDRALAZINE HCL 25 MG PO TABS: ORAL_TABLET | ORAL | 3 refills | Status: DC

## 2023-07-20 DIAGNOSIS — R2681 Unsteadiness on feet: Secondary | ICD-10-CM | POA: Diagnosis not present

## 2023-08-14 ENCOUNTER — Other Ambulatory Visit: Payer: Self-pay | Admitting: Internal Medicine

## 2023-08-14 DIAGNOSIS — I10 Essential (primary) hypertension: Secondary | ICD-10-CM

## 2023-09-04 DIAGNOSIS — E119 Type 2 diabetes mellitus without complications: Secondary | ICD-10-CM | POA: Diagnosis not present

## 2023-09-04 DIAGNOSIS — K5520 Angiodysplasia of colon without hemorrhage: Secondary | ICD-10-CM | POA: Diagnosis not present

## 2023-09-04 DIAGNOSIS — E785 Hyperlipidemia, unspecified: Secondary | ICD-10-CM | POA: Diagnosis not present

## 2023-09-05 LAB — LAB REPORT - SCANNED
A1c: 6.5
Calcium: 9
EGFR: 29

## 2023-09-07 DIAGNOSIS — H35372 Puckering of macula, left eye: Secondary | ICD-10-CM | POA: Diagnosis not present

## 2023-09-07 DIAGNOSIS — H2511 Age-related nuclear cataract, right eye: Secondary | ICD-10-CM | POA: Diagnosis not present

## 2023-09-07 DIAGNOSIS — E119 Type 2 diabetes mellitus without complications: Secondary | ICD-10-CM | POA: Diagnosis not present

## 2023-09-09 DIAGNOSIS — H43811 Vitreous degeneration, right eye: Secondary | ICD-10-CM | POA: Diagnosis not present

## 2023-09-09 DIAGNOSIS — H35372 Puckering of macula, left eye: Secondary | ICD-10-CM | POA: Diagnosis not present

## 2023-09-09 DIAGNOSIS — H26491 Other secondary cataract, right eye: Secondary | ICD-10-CM | POA: Diagnosis not present

## 2023-09-09 DIAGNOSIS — H3581 Retinal edema: Secondary | ICD-10-CM | POA: Diagnosis not present

## 2023-09-09 DIAGNOSIS — D3132 Benign neoplasm of left choroid: Secondary | ICD-10-CM | POA: Diagnosis not present

## 2023-09-11 DIAGNOSIS — E1122 Type 2 diabetes mellitus with diabetic chronic kidney disease: Secondary | ICD-10-CM | POA: Diagnosis not present

## 2023-09-11 DIAGNOSIS — I10 Essential (primary) hypertension: Secondary | ICD-10-CM | POA: Diagnosis not present

## 2023-09-11 DIAGNOSIS — K5520 Angiodysplasia of colon without hemorrhage: Secondary | ICD-10-CM | POA: Diagnosis not present

## 2023-09-11 DIAGNOSIS — D509 Iron deficiency anemia, unspecified: Secondary | ICD-10-CM | POA: Diagnosis not present

## 2023-09-11 DIAGNOSIS — E7800 Pure hypercholesterolemia, unspecified: Secondary | ICD-10-CM | POA: Diagnosis not present

## 2023-09-11 DIAGNOSIS — I2510 Atherosclerotic heart disease of native coronary artery without angina pectoris: Secondary | ICD-10-CM | POA: Diagnosis not present

## 2023-09-11 DIAGNOSIS — N1832 Chronic kidney disease, stage 3b: Secondary | ICD-10-CM | POA: Diagnosis not present

## 2023-09-14 ENCOUNTER — Encounter: Payer: Self-pay | Admitting: Cardiology

## 2023-09-15 DIAGNOSIS — N1832 Chronic kidney disease, stage 3b: Secondary | ICD-10-CM | POA: Diagnosis not present

## 2023-09-21 DIAGNOSIS — I129 Hypertensive chronic kidney disease with stage 1 through stage 4 chronic kidney disease, or unspecified chronic kidney disease: Secondary | ICD-10-CM | POA: Diagnosis not present

## 2023-09-21 DIAGNOSIS — D509 Iron deficiency anemia, unspecified: Secondary | ICD-10-CM | POA: Diagnosis not present

## 2023-09-21 DIAGNOSIS — N1832 Chronic kidney disease, stage 3b: Secondary | ICD-10-CM | POA: Diagnosis not present

## 2023-09-21 DIAGNOSIS — E785 Hyperlipidemia, unspecified: Secondary | ICD-10-CM | POA: Diagnosis not present

## 2023-09-21 DIAGNOSIS — E119 Type 2 diabetes mellitus without complications: Secondary | ICD-10-CM | POA: Diagnosis not present

## 2023-09-21 DIAGNOSIS — E1122 Type 2 diabetes mellitus with diabetic chronic kidney disease: Secondary | ICD-10-CM | POA: Diagnosis not present

## 2023-10-15 DIAGNOSIS — H3581 Retinal edema: Secondary | ICD-10-CM | POA: Diagnosis not present

## 2023-10-15 DIAGNOSIS — H35372 Puckering of macula, left eye: Secondary | ICD-10-CM | POA: Diagnosis not present

## 2023-10-23 DIAGNOSIS — H35372 Puckering of macula, left eye: Secondary | ICD-10-CM | POA: Diagnosis not present

## 2023-11-11 ENCOUNTER — Telehealth: Payer: Self-pay | Admitting: Cardiology

## 2023-11-11 DIAGNOSIS — I10 Essential (primary) hypertension: Secondary | ICD-10-CM

## 2023-11-11 MED ORDER — ISOSORBIDE DINITRATE 30 MG PO TABS: ORAL_TABLET | ORAL | 2 refills | Status: DC

## 2023-11-11 NOTE — Telephone Encounter (Signed)
*  STAT* If patient is at the pharmacy, call can be transferred to refill team.   1. Which medications need to be refilled? (please list name of each medication and dose if known) isosorbide dinitrate (ISORDIL) 30 MG tablet    2. Would you like to learn more about the convenience, safety, & potential cost savings by using the [facility]? No   3. Are you open to using the [facility] (Type [facility].  ). No   4. Which pharmacy/location (including street and city if local pharmacy) is medication to be sent to? [id number] - , [place] - [address]    5. Do they need a 30 day or 90 day supply? 90 Day   Pt is out of this medication

## 2023-11-11 NOTE — Telephone Encounter (Signed)
 Pt's medication was sent to pt's pharmacy as requested. Confirmation received.

## 2023-11-23 DIAGNOSIS — H35372 Puckering of macula, left eye: Secondary | ICD-10-CM | POA: Diagnosis not present

## 2023-12-14 DIAGNOSIS — N1832 Chronic kidney disease, stage 3b: Secondary | ICD-10-CM | POA: Diagnosis not present

## 2023-12-21 DIAGNOSIS — N1832 Chronic kidney disease, stage 3b: Secondary | ICD-10-CM | POA: Diagnosis not present

## 2023-12-21 DIAGNOSIS — E1122 Type 2 diabetes mellitus with diabetic chronic kidney disease: Secondary | ICD-10-CM | POA: Diagnosis not present

## 2023-12-21 DIAGNOSIS — E785 Hyperlipidemia, unspecified: Secondary | ICD-10-CM | POA: Diagnosis not present

## 2023-12-21 DIAGNOSIS — I129 Hypertensive chronic kidney disease with stage 1 through stage 4 chronic kidney disease, or unspecified chronic kidney disease: Secondary | ICD-10-CM | POA: Diagnosis not present

## 2024-01-01 DIAGNOSIS — H6120 Impacted cerumen, unspecified ear: Secondary | ICD-10-CM | POA: Diagnosis not present

## 2024-02-23 DIAGNOSIS — H35033 Hypertensive retinopathy, bilateral: Secondary | ICD-10-CM | POA: Diagnosis not present

## 2024-02-23 DIAGNOSIS — H43811 Vitreous degeneration, right eye: Secondary | ICD-10-CM | POA: Diagnosis not present

## 2024-02-23 DIAGNOSIS — Z961 Presence of intraocular lens: Secondary | ICD-10-CM | POA: Diagnosis not present

## 2024-02-23 DIAGNOSIS — D3132 Benign neoplasm of left choroid: Secondary | ICD-10-CM | POA: Diagnosis not present

## 2024-02-23 DIAGNOSIS — H26491 Other secondary cataract, right eye: Secondary | ICD-10-CM | POA: Diagnosis not present

## 2024-02-26 DIAGNOSIS — D509 Iron deficiency anemia, unspecified: Secondary | ICD-10-CM | POA: Diagnosis not present

## 2024-02-26 DIAGNOSIS — R739 Hyperglycemia, unspecified: Secondary | ICD-10-CM | POA: Diagnosis not present

## 2024-02-26 DIAGNOSIS — I10 Essential (primary) hypertension: Secondary | ICD-10-CM | POA: Diagnosis not present

## 2024-03-04 DIAGNOSIS — I10 Essential (primary) hypertension: Secondary | ICD-10-CM | POA: Diagnosis not present

## 2024-03-04 DIAGNOSIS — I2510 Atherosclerotic heart disease of native coronary artery without angina pectoris: Secondary | ICD-10-CM | POA: Diagnosis not present

## 2024-03-04 DIAGNOSIS — E785 Hyperlipidemia, unspecified: Secondary | ICD-10-CM | POA: Diagnosis not present

## 2024-03-04 DIAGNOSIS — Z0000 Encounter for general adult medical examination without abnormal findings: Secondary | ICD-10-CM | POA: Diagnosis not present

## 2024-03-04 DIAGNOSIS — D509 Iron deficiency anemia, unspecified: Secondary | ICD-10-CM | POA: Diagnosis not present

## 2024-03-04 DIAGNOSIS — K5520 Angiodysplasia of colon without hemorrhage: Secondary | ICD-10-CM | POA: Diagnosis not present

## 2024-03-04 DIAGNOSIS — E119 Type 2 diabetes mellitus without complications: Secondary | ICD-10-CM | POA: Diagnosis not present

## 2024-06-28 ENCOUNTER — Other Ambulatory Visit: Payer: Self-pay | Admitting: Cardiology

## 2024-06-28 DIAGNOSIS — I10 Essential (primary) hypertension: Secondary | ICD-10-CM

## 2024-06-29 ENCOUNTER — Other Ambulatory Visit: Payer: Self-pay | Admitting: Cardiology

## 2024-06-29 DIAGNOSIS — I10 Essential (primary) hypertension: Secondary | ICD-10-CM

## 2024-06-29 MED ORDER — HYDRALAZINE HCL 25 MG PO TABS: ORAL_TABLET | ORAL | 0 refills | Status: AC

## 2024-08-02 ENCOUNTER — Other Ambulatory Visit: Payer: Self-pay

## 2024-08-02 DIAGNOSIS — I10 Essential (primary) hypertension: Secondary | ICD-10-CM

## 2024-08-03 MED ORDER — ISOSORBIDE DINITRATE 30 MG PO TABS: ORAL_TABLET | ORAL | 0 refills | Status: DC

## 2024-09-02 LAB — LAB REPORT - SCANNED
A1c: 6.2
EGFR: 28

## 2024-09-07 ENCOUNTER — Other Ambulatory Visit: Payer: Self-pay | Admitting: Cardiology

## 2024-09-07 DIAGNOSIS — I10 Essential (primary) hypertension: Secondary | ICD-10-CM

## 2024-09-12 ENCOUNTER — Other Ambulatory Visit: Payer: Self-pay | Admitting: Cardiology

## 2024-09-12 DIAGNOSIS — I10 Essential (primary) hypertension: Secondary | ICD-10-CM

## 2024-09-15 NOTE — Telephone Encounter (Signed)
 Medication already refilled 09/12/24.
